# Patient Record
Sex: Female | Born: 1955
Health system: Southern US, Community
[De-identification: ages and names within clinical notes are randomized; demographics above are authoritative.]

## PROBLEM LIST (undated history)

## (undated) DIAGNOSIS — Z8041 Family history of malignant neoplasm of ovary: Secondary | ICD-10-CM

## (undated) DIAGNOSIS — R112 Nausea with vomiting, unspecified: Secondary | ICD-10-CM

## (undated) DIAGNOSIS — E7800 Pure hypercholesterolemia, unspecified: Secondary | ICD-10-CM

## (undated) DIAGNOSIS — C439 Malignant melanoma of skin, unspecified: Secondary | ICD-10-CM

## (undated) DIAGNOSIS — Z923 Personal history of irradiation: Secondary | ICD-10-CM

## (undated) DIAGNOSIS — Z803 Family history of malignant neoplasm of breast: Secondary | ICD-10-CM

## (undated) DIAGNOSIS — F909 Attention-deficit hyperactivity disorder, unspecified type: Secondary | ICD-10-CM

## (undated) DIAGNOSIS — Z1379 Encounter for other screening for genetic and chromosomal anomalies: Principal | ICD-10-CM

## (undated) DIAGNOSIS — C801 Malignant (primary) neoplasm, unspecified: Secondary | ICD-10-CM

## (undated) DIAGNOSIS — Z9889 Other specified postprocedural states: Secondary | ICD-10-CM

## (undated) DIAGNOSIS — Z800 Family history of malignant neoplasm of digestive organs: Secondary | ICD-10-CM

## (undated) HISTORY — DX: Malignant melanoma of skin, unspecified: C43.9

## (undated) HISTORY — DX: Family history of malignant neoplasm of digestive organs: Z80.0

## (undated) HISTORY — DX: Encounter for other screening for genetic and chromosomal anomalies: Z13.79

## (undated) HISTORY — DX: Personal history of irradiation: Z92.3

## (undated) HISTORY — DX: Family history of malignant neoplasm of breast: Z80.3

## (undated) HISTORY — PX: REDUCTION MAMMAPLASTY: SUR839

## (undated) HISTORY — PX: BREAST LUMPECTOMY: SHX2

## (undated) HISTORY — DX: Family history of malignant neoplasm of ovary: Z80.41

## (undated) HISTORY — PX: SHOULDER ARTHROSCOPY W/ ROTATOR CUFF REPAIR: SHX2400

## (undated) HISTORY — PX: ABDOMINAL HYSTERECTOMY: SHX81

## (undated) HISTORY — PX: KNEE ARTHROSCOPY: SUR90

---

## 1998-07-13 ENCOUNTER — Encounter: Payer: Self-pay | Admitting: Obstetrics & Gynecology

## 1998-07-13 ENCOUNTER — Ambulatory Visit (HOSPITAL_COMMUNITY): Admission: RE | Admit: 1998-07-13 | Discharge: 1998-07-13 | Payer: Self-pay | Admitting: Obstetrics & Gynecology

## 1999-11-05 ENCOUNTER — Ambulatory Visit (HOSPITAL_COMMUNITY): Admission: RE | Admit: 1999-11-05 | Discharge: 1999-11-05 | Payer: Self-pay | Admitting: Occupational Medicine

## 1999-11-05 ENCOUNTER — Encounter: Payer: Self-pay | Admitting: Occupational Medicine

## 1999-12-09 ENCOUNTER — Ambulatory Visit (HOSPITAL_BASED_OUTPATIENT_CLINIC_OR_DEPARTMENT_OTHER): Admission: RE | Admit: 1999-12-09 | Discharge: 1999-12-09 | Payer: Self-pay | Admitting: Orthopedic Surgery

## 1999-12-17 ENCOUNTER — Encounter: Admission: RE | Admit: 1999-12-17 | Discharge: 2000-02-16 | Payer: Self-pay | Admitting: Orthopedic Surgery

## 2000-04-11 ENCOUNTER — Other Ambulatory Visit: Admission: RE | Admit: 2000-04-11 | Discharge: 2000-04-11 | Payer: Self-pay | Admitting: Obstetrics & Gynecology

## 2000-05-09 ENCOUNTER — Encounter: Payer: Self-pay | Admitting: Obstetrics & Gynecology

## 2000-05-09 ENCOUNTER — Ambulatory Visit (HOSPITAL_COMMUNITY): Admission: RE | Admit: 2000-05-09 | Discharge: 2000-05-09 | Payer: Self-pay | Admitting: Obstetrics & Gynecology

## 2000-05-17 ENCOUNTER — Encounter: Admission: RE | Admit: 2000-05-17 | Discharge: 2000-05-17 | Payer: Self-pay | Admitting: Obstetrics & Gynecology

## 2000-05-17 ENCOUNTER — Encounter: Payer: Self-pay | Admitting: Obstetrics & Gynecology

## 2001-06-04 ENCOUNTER — Encounter: Payer: Self-pay | Admitting: Obstetrics & Gynecology

## 2001-06-04 ENCOUNTER — Encounter: Admission: RE | Admit: 2001-06-04 | Discharge: 2001-06-04 | Payer: Self-pay | Admitting: Obstetrics & Gynecology

## 2001-11-02 ENCOUNTER — Other Ambulatory Visit: Admission: RE | Admit: 2001-11-02 | Discharge: 2001-11-02 | Payer: Self-pay | Admitting: Obstetrics & Gynecology

## 2002-02-04 ENCOUNTER — Emergency Department (HOSPITAL_COMMUNITY): Admission: EM | Admit: 2002-02-04 | Discharge: 2002-02-04 | Payer: Self-pay | Admitting: Emergency Medicine

## 2003-06-03 ENCOUNTER — Ambulatory Visit (HOSPITAL_BASED_OUTPATIENT_CLINIC_OR_DEPARTMENT_OTHER): Admission: RE | Admit: 2003-06-03 | Discharge: 2003-06-03 | Payer: Self-pay | Admitting: Plastic Surgery

## 2003-06-03 ENCOUNTER — Ambulatory Visit (HOSPITAL_COMMUNITY): Admission: RE | Admit: 2003-06-03 | Discharge: 2003-06-03 | Payer: Self-pay | Admitting: Plastic Surgery

## 2003-06-03 ENCOUNTER — Encounter (INDEPENDENT_AMBULATORY_CARE_PROVIDER_SITE_OTHER): Payer: Self-pay | Admitting: Specialist

## 2003-06-18 ENCOUNTER — Ambulatory Visit (HOSPITAL_COMMUNITY): Admission: RE | Admit: 2003-06-18 | Discharge: 2003-06-18 | Payer: Self-pay | Admitting: Obstetrics & Gynecology

## 2003-09-02 ENCOUNTER — Other Ambulatory Visit: Admission: RE | Admit: 2003-09-02 | Discharge: 2003-09-02 | Payer: Self-pay | Admitting: Obstetrics & Gynecology

## 2004-07-17 ENCOUNTER — Emergency Department (HOSPITAL_COMMUNITY): Admission: EM | Admit: 2004-07-17 | Discharge: 2004-07-17 | Payer: Self-pay | Admitting: Family Medicine

## 2004-08-23 ENCOUNTER — Ambulatory Visit (HOSPITAL_COMMUNITY): Admission: RE | Admit: 2004-08-23 | Discharge: 2004-08-23 | Payer: Self-pay | Admitting: Obstetrics & Gynecology

## 2004-09-06 ENCOUNTER — Other Ambulatory Visit: Admission: RE | Admit: 2004-09-06 | Discharge: 2004-09-06 | Payer: Self-pay | Admitting: Obstetrics & Gynecology

## 2005-10-18 ENCOUNTER — Ambulatory Visit (HOSPITAL_COMMUNITY): Admission: RE | Admit: 2005-10-18 | Discharge: 2005-10-18 | Payer: Self-pay | Admitting: Obstetrics & Gynecology

## 2008-04-03 ENCOUNTER — Ambulatory Visit (HOSPITAL_COMMUNITY): Admission: RE | Admit: 2008-04-03 | Discharge: 2008-04-03 | Payer: Self-pay | Admitting: Obstetrics & Gynecology

## 2009-08-21 ENCOUNTER — Ambulatory Visit (HOSPITAL_COMMUNITY): Admission: RE | Admit: 2009-08-21 | Discharge: 2009-08-21 | Payer: Self-pay | Admitting: Obstetrics & Gynecology

## 2009-08-31 ENCOUNTER — Encounter: Admission: RE | Admit: 2009-08-31 | Discharge: 2009-08-31 | Payer: Self-pay | Admitting: Family Medicine

## 2010-07-06 ENCOUNTER — Encounter
Admission: RE | Admit: 2010-07-06 | Discharge: 2010-07-06 | Payer: Self-pay | Source: Home / Self Care | Attending: Obstetrics & Gynecology | Admitting: Obstetrics & Gynecology

## 2010-08-01 ENCOUNTER — Encounter: Payer: Self-pay | Admitting: Family Medicine

## 2010-08-01 ENCOUNTER — Encounter: Payer: Self-pay | Admitting: Obstetrics & Gynecology

## 2010-09-28 ENCOUNTER — Other Ambulatory Visit: Payer: Self-pay | Admitting: Obstetrics & Gynecology

## 2010-09-28 DIAGNOSIS — Z1231 Encounter for screening mammogram for malignant neoplasm of breast: Secondary | ICD-10-CM

## 2010-10-11 ENCOUNTER — Ambulatory Visit
Admission: RE | Admit: 2010-10-11 | Discharge: 2010-10-11 | Disposition: A | Discharge status: Home / Self Care | Payer: Commercial Indemnity | Source: Ambulatory Visit | Attending: Obstetrics & Gynecology | Admitting: Obstetrics & Gynecology

## 2010-10-11 DIAGNOSIS — Z1231 Encounter for screening mammogram for malignant neoplasm of breast: Secondary | ICD-10-CM

## 2010-11-26 NOTE — Op Note (Signed)
[facility]  Patient:    Felicia Hampton, Felicia Hampton                         MRN: [id number] Proc. Date: 12/09/99 Adm. Date:  [id number] Disc. Date: [id number] Attending:  Colbert Ewing                           Operative Report  PREOPERATIVE DIAGNOSIS:  Left knee torn anterior cruciate ligament and medial meniscus with previous partial open medial meniscectomy.  POSTOPERATIVE DIAGNOSES: 1. Left knee medial meniscus and flap tear of the posterior horn of the    lateral meniscus. 2. Partial anterior cruciate ligament tear without demonstrable instability. 3. 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 4. Fibrotic symptomatic medial plica.  PROCEDURES: 1. Left knee examination under anesthesia. 2. Arthroscopy with chondroplasty of the patella. 3. Excision of the medial plica. 4. Partial medial and lateral meniscectomies. 5. Assessment of the anterior cruciate ligament.  SURGEON:  Loreta Ave, M.D.  ASSISTANT:  Arlys John D. Petrarca, P.A.-C.  ANESTHESIA:  General.  ESTIMATED BLOOD LOSS:  Minimal.  TOURNIQUET TIME:  35 minutes.  SPECIMENS:  None.  CULTURES:  None.  COMPLICATIONS:  None.  DRESSING:  Soft compressive.  DESCRIPTION OF PROCEDURE:  The patient was brought to the operating room and, after adequate anesthesia had been obtained, both knees were examined.  At this point in time, I could not demonstrate significant translational or rotary instability of the left knee on ACL testing.  She did move more on this side compared to the opposite side, but I got a firm end point.  She had pseudolaxity of her medial collateral ligaments of both knees from previous open procedures, but this was not marked.  LCL and PCL were intact.  The tourniquet was applied and she was prepped and draped in the usual sterile fashion.  Exsanguinated with an Esmarch and the tourniquet inflated to 300 mmHg.  Three portals were created, one  superolateral and one each medial and lateral parapatella.  The inflow cannula was introduced and the knee was distended and the arthroscope introduced.  Some traumatic grade 3 chondromalacia of the medial patellar facet was debrided to a stable surface. The trochlea looked good.  Tracking looked good and the chondral changes relatively focal in the medial compartment once debrided to a stable surface and reasonable thickness of cartilage left.  Stab fibrous medial plica with grade 2 changes on the condyle below.  The plica was excised.  The ACL had evidence of injury with partial tearing and fraying at the margins.  This was completely debrided for the frayed nonfunctional fibers, but more than 80% of the ligament still had integrity.  There was a good connection from proximal to distal end with excellent stability on Lachman and drawer and only 1+ excursion with an end point.  Given this, reconstruction was not indicated. Previous open meniscectomy but with retained meniscus about half the meniscus all the way around.  Complex tearing of the middle third with a displaced, relatively large flap tear in the posterior third.  Both saucerized out, retaining a little bit of meniscus at the margin all the way around.  Minimal degenerative changes medially.  The lateral meniscus had a displaced flap tear off of the posterior third.  This was saucerized out and tapered in smoothly. Some fissuring and grade 2 changes laterally, otherwise nothing marked.  The  entire knee was examined.  No other significant findings were appreciated. The instruments and fluid were removed.  The portals and the knee were injected with Marcaine.  The portals were closed with 4-0 nylon. A sterile compressing dressing was applied.  The tourniquet was deflated.  Anesthesia was reversed.  The patient was brought to the recovery room.  She tolerated the surgery well with no complications. DD:  12/09/99 TD:  12/14/99 Job:  [id number]

## 2010-11-26 NOTE — Op Note (Signed)
Felicia, Hampton                            ACCOUNT NO.:  [id number]   MEDICAL RECORD NO.:  [id number]                   PATIENT TYPE:  AMB   LOCATION:  DSC                                  FACILITY:  [facility]   PHYSICIAN:  Alfredia Ferguson, M.D.               DATE OF BIRTH:  22-Jan-1956   DATE OF PROCEDURE:  06/03/2003  DATE OF DISCHARGE:                                 OPERATIVE REPORT   PREOPERATIVE DIAGNOSIS:  1. Biopsy proven actinic keratosis with atypia, left hand, dorsal surface.  2. Suspicious lesions x 2, right hand, dorsal surface.   POSTOPERATIVE DIAGNOSES:  1. Biopsy proven actinic keratosis with atypia, left hand, dorsal surface.  2. Suspicious lesions x 2, right hand, dorsal surface.   OPERATION PERFORMED:  1. Excision of biopsy proven actinic keratosis, dorsum, left hand.  2. Excision, suspicious scaly lesions, right hand, dorsal surface (2 lesions     adjacent to one another).   SURGEON:  Alfredia Ferguson, M.D.   ANESTHESIA:  2% Xylocaine with 1:100,000 epinephrine.   INDICATIONS FOR SURGERY:  This is a 55 year old woman with a biopsy proven  actinic keratosis on the left hand, which showed some atypia.  The plan is  to excise this lesion.  The patient also has 2 adjacent lesions on the  dorsum of the right hand, which look very similar to what was on the left  hand.  She wishes to have those removed.  My plan is to remove both of those  as the same specimen.   DESCRIPTION OF PROCEDURE:  Skin marks were placed around the lesion on the  left hand and the 2 lesions on the right hand.  Local anesthesia using 2%  Xylocaine with 1:100,000 epinephrine was infiltrated.  After waiting  approximately 5 minutes, the right hand was prepped with Betadine and draped  with sterile drapes.  An elliptical excision of the 2 lesions on the right  hand was carried out.  Specimen was passed off for pathology.  Hemostasis  was accomplished using pressure.  The wound was closed using  a running 5-0  nylon suture.   Attention was directed toward the dorsum of the left hand where an identical  procedure was performed.  Closure was carried out in similar fashion.  Each  hand was now cleansed of Betadine, dried, and a light dressing was applied.   The patient was discharged to home in satisfactory condition.                                               Alfredia Ferguson, M.D.    WBB/MEDQ  D:  06/03/2003  T:  06/03/2003  Job:  [id number]

## 2014-05-08 ENCOUNTER — Other Ambulatory Visit: Payer: Self-pay | Admitting: Obstetrics & Gynecology

## 2014-05-09 LAB — CYTOLOGY - PAP

## 2015-02-13 ENCOUNTER — Other Ambulatory Visit (HOSPITAL_COMMUNITY): Payer: Self-pay | Admitting: Obstetrics & Gynecology

## 2015-02-13 DIAGNOSIS — Z1231 Encounter for screening mammogram for malignant neoplasm of breast: Secondary | ICD-10-CM

## 2015-02-16 ENCOUNTER — Ambulatory Visit (HOSPITAL_COMMUNITY)
Admission: RE | Admit: 2015-02-16 | Discharge: 2015-02-16 | Disposition: A | Discharge status: Home / Self Care | Payer: PRIVATE HEALTH INSURANCE | Source: Ambulatory Visit | Attending: Obstetrics & Gynecology | Admitting: Obstetrics & Gynecology

## 2015-02-16 DIAGNOSIS — Z1231 Encounter for screening mammogram for malignant neoplasm of breast: Secondary | ICD-10-CM | POA: Insufficient documentation

## 2016-06-23 ENCOUNTER — Other Ambulatory Visit: Payer: Self-pay | Admitting: Obstetrics & Gynecology

## 2016-06-23 DIAGNOSIS — R928 Other abnormal and inconclusive findings on diagnostic imaging of breast: Secondary | ICD-10-CM

## 2016-07-01 ENCOUNTER — Ambulatory Visit
Admission: RE | Admit: 2016-07-01 | Discharge: 2016-07-01 | Disposition: A | Discharge status: Home / Self Care | Payer: Commercial Indemnity | Source: Ambulatory Visit | Attending: Obstetrics & Gynecology | Admitting: Obstetrics & Gynecology

## 2016-07-01 DIAGNOSIS — R928 Other abnormal and inconclusive findings on diagnostic imaging of breast: Secondary | ICD-10-CM

## 2016-07-01 MED ORDER — GADOBENATE DIMEGLUMINE 529 MG/ML IV SOLN
16.0000 mL | Freq: Once | INTRAVENOUS | Status: AC | PRN
  Administered 2016-07-01: 16 mL via INTRAVENOUS

## 2016-07-07 ENCOUNTER — Other Ambulatory Visit: Payer: Self-pay | Admitting: Obstetrics & Gynecology

## 2016-07-07 DIAGNOSIS — R9389 Abnormal findings on diagnostic imaging of other specified body structures: Secondary | ICD-10-CM

## 2016-07-11 DIAGNOSIS — C50919 Malignant neoplasm of unspecified site of unspecified female breast: Secondary | ICD-10-CM

## 2016-07-11 DIAGNOSIS — Z923 Personal history of irradiation: Secondary | ICD-10-CM

## 2016-07-11 DIAGNOSIS — C801 Malignant (primary) neoplasm, unspecified: Secondary | ICD-10-CM

## 2016-07-11 HISTORY — DX: Malignant neoplasm of unspecified site of unspecified female breast: C50.919

## 2016-07-11 HISTORY — DX: Personal history of irradiation: Z92.3

## 2016-07-11 HISTORY — DX: Malignant (primary) neoplasm, unspecified: C80.1

## 2016-07-12 ENCOUNTER — Ambulatory Visit
Admission: RE | Admit: 2016-07-12 | Discharge: 2016-07-12 | Disposition: A | Discharge status: Home / Self Care | Payer: Commercial Indemnity | Source: Ambulatory Visit | Attending: Obstetrics & Gynecology | Admitting: Obstetrics & Gynecology

## 2016-07-12 DIAGNOSIS — R9389 Abnormal findings on diagnostic imaging of other specified body structures: Secondary | ICD-10-CM

## 2016-07-12 MED ORDER — GADOBENATE DIMEGLUMINE 529 MG/ML IV SOLN
16.0000 mL | Freq: Once | INTRAVENOUS | Status: AC | PRN
  Administered 2016-07-12: 16 mL via INTRAVENOUS

## 2016-07-15 ENCOUNTER — Encounter: Payer: Self-pay | Admitting: Oncology

## 2016-07-18 ENCOUNTER — Ambulatory Visit
Admission: RE | Admit: 2016-07-18 | Discharge: 2016-07-18 | Disposition: A | Discharge status: Home / Self Care | Payer: Commercial Indemnity | Source: Ambulatory Visit | Attending: General Surgery | Admitting: General Surgery

## 2016-07-18 ENCOUNTER — Other Ambulatory Visit: Payer: Self-pay | Admitting: General Surgery

## 2016-07-18 DIAGNOSIS — N6320 Unspecified lump in the left breast, unspecified quadrant: Secondary | ICD-10-CM

## 2016-07-19 ENCOUNTER — Telehealth: Payer: Self-pay | Admitting: Genetic Counselor

## 2016-07-19 NOTE — Telephone Encounter (Signed)
Pt confirmed appt, verified demo and insurance, reviewed location of [place]. Faxed appt date/time to referring provider.

## 2016-07-21 ENCOUNTER — Ambulatory Visit (HOSPITAL_BASED_OUTPATIENT_CLINIC_OR_DEPARTMENT_OTHER): Payer: Commercial Indemnity | Admitting: Genetic Counselor

## 2016-07-21 ENCOUNTER — Other Ambulatory Visit: Payer: Self-pay | Admitting: General Surgery

## 2016-07-21 ENCOUNTER — Other Ambulatory Visit: Payer: Commercial Indemnity

## 2016-07-21 ENCOUNTER — Encounter: Payer: Self-pay | Admitting: Genetic Counselor

## 2016-07-21 DIAGNOSIS — C50912 Malignant neoplasm of unspecified site of left female breast: Secondary | ICD-10-CM | POA: Diagnosis not present

## 2016-07-21 DIAGNOSIS — Z803 Family history of malignant neoplasm of breast: Secondary | ICD-10-CM | POA: Insufficient documentation

## 2016-07-21 DIAGNOSIS — Z170 Estrogen receptor positive status [ER+]: Principal | ICD-10-CM

## 2016-07-21 DIAGNOSIS — Z315 Encounter for genetic counseling: Secondary | ICD-10-CM

## 2016-07-21 DIAGNOSIS — Z8041 Family history of malignant neoplasm of ovary: Secondary | ICD-10-CM

## 2016-07-21 DIAGNOSIS — C50212 Malignant neoplasm of upper-inner quadrant of left female breast: Secondary | ICD-10-CM

## 2016-07-21 DIAGNOSIS — N6092 Unspecified benign mammary dysplasia of left breast: Secondary | ICD-10-CM

## 2016-07-21 DIAGNOSIS — Z800 Family history of malignant neoplasm of digestive organs: Secondary | ICD-10-CM | POA: Diagnosis not present

## 2016-07-21 NOTE — Progress Notes (Signed)
[facility]   Patient Name: Felicia Hampton Patient DOB: 07-21-1955 Encounter Date: 07/21/2016  Referring Provider: No referring provider defined for this encounter.  Primary Care Provider: No primary care provider on file.  Reason for Visit: Evaluate for hereditary susceptibility to cancer  Ms. Connye Burkitt, a 61 y.o. female, is being seen at the [facility] due to a personal and family history of cancer. She presents to clinic today to discuss the possibility of a hereditary predisposition to cancer and discuss whether genetic testing is warranted.  History of Present Illness: Ms. Ehly was diagnosed recently with left breast cancer at the age of 1. She will be using results of genetic testing to help guide her surgical management.  The breast tumor was ER positive, PR positive, and HER2 negative.  She reports a history of basal cell carcinoma.  She reports a hysterectomy at age 36, but her ovaries remain intact.  She reports a colonoscopy in 2016 where multiple polyps were removed, but she did not know the number or type.   Past Medical History:  Diagnosis Date  . Family history of breast cancer   . Family history of colon cancer   . Family history of ovarian cancer     No past surgical history on file.  Social History   Social History  . Marital status: Single    Spouse name: N/A  . Number of children: N/A  . Years of education: N/A   Social History Main Topics  . Smoking status: Not on file  . Smokeless tobacco: Not on file  . Alcohol use Not on file  . Drug use: Unknown  . Sexual activity: Not on file   Other Topics Concern  . Not on file   Social History Narrative  . No narrative on file     Family History:  During the visit, a 4-generation pedigree was obtained. Family tree will be scanned in the Media tab in Epic  Significant diagnoses include the following:  Family History  Problem  Relation Age of Onset  . Colon cancer Sister 31    deceased 54  . Melanoma Brother     on back; currently 62  . Ovarian cancer Maternal Aunt   . Colon cancer Paternal Grandmother 66    deceased  . Breast cancer Cousin 19    mat female cousin; daughter of unaffected mat aunt; deceased 66    Additionally, Ms. Hemrick has a son (age 60). She has 4 sisters more and 4 more brothers who are all living; cancer-free. One sister died at 77, unrelated to cancer. Her mother died at 39, cancer-free. She did have a TAH/BSO in her 34s. Her mother had a total of 4 sisters and 5 brothers; only one sister had ovarian cancer. Her father had 5 brothers and no sisters.  Ms. Kobrin ancestry is Caucasian from [place]. There is no known Jewish ancestry and no consanguinity.  Assessment and Plan: Ms. Weisenburger is a 61 y.o. female with a personal and family history of cancer as noted above. Given the large size of her family and many unaffected family members, this history is not suggestive of a hereditary predisposition to cancer. Genetic testing is recommended, however, to determine whether she has a pathogenic mutation that would impact her surgical management as well as cancer screening and risk-reduction options for future cancers. We reviewed the characteristics, features and inheritance patterns of  hereditary cancer syndromes. We discussed the process of genetic testing, including insurance coverage and implications of results: positive, negative and Variant of Uncertain Significance. A negative result will be reassuring.   Ms. Picone wished to pursue genetic testing and a blood sample will be sent to [facility] for analysis. [facility]'s STAT breast panel was requested as it will impact surgical decisions and results should be available in about 1 week. The 9 genes on this panel are ATM, BRCA1, BRCA2, CDH1, CHEK2, PALB2, PTEN, STK11, TP53. If this test is negative, analysis of additional genes on a larger hereditary cancer  panel will proceed. She will be called after each result is obtained.  Ms. Shimer is encouraged to remain in contact with Cancer Genetics annually so that we can update the family history and inform her of any changes in cancer genetics and testing that may be of benefit for this family. Ms.  Robertshaw questions were answered to her satisfaction today and she is welcome to call with any additional questions or concerns. Thank you for the referral and allowing [place] to share in the care of your patient.   Dr. Jana Hakim was available for questions concerning this case. Total time spent by Steele Berg, MS, CGC in face-to-face counseling was approximately 35 minutes.   Steele Berg, MS, [place] Certified Genetic Counselor phone: [phone] [email or web address]   ______________________________________________________________________ For Office Staff:  Number of people involved in session: 1 Was an Intern/ student involved with case: no

## 2016-07-25 ENCOUNTER — Other Ambulatory Visit: Payer: Self-pay | Admitting: *Deleted

## 2016-07-25 DIAGNOSIS — C50912 Malignant neoplasm of unspecified site of left female breast: Secondary | ICD-10-CM

## 2016-07-25 DIAGNOSIS — Z170 Estrogen receptor positive status [ER+]: Principal | ICD-10-CM

## 2016-07-26 ENCOUNTER — Ambulatory Visit (HOSPITAL_BASED_OUTPATIENT_CLINIC_OR_DEPARTMENT_OTHER): Payer: Commercial Indemnity | Admitting: Oncology

## 2016-07-26 ENCOUNTER — Other Ambulatory Visit (HOSPITAL_BASED_OUTPATIENT_CLINIC_OR_DEPARTMENT_OTHER): Payer: Commercial Indemnity

## 2016-07-26 DIAGNOSIS — C50212 Malignant neoplasm of upper-inner quadrant of left female breast: Secondary | ICD-10-CM

## 2016-07-26 DIAGNOSIS — C50912 Malignant neoplasm of unspecified site of left female breast: Secondary | ICD-10-CM

## 2016-07-26 DIAGNOSIS — Z170 Estrogen receptor positive status [ER+]: Principal | ICD-10-CM

## 2016-07-26 LAB — CBC WITH DIFFERENTIAL/PLATELET
BASO%: 0.6 % (ref 0.0–2.0)
BASOS ABS: 0 10*3/uL (ref 0.0–0.1)
EOS ABS: 0.1 10*3/uL (ref 0.0–0.5)
EOS%: 1.5 % (ref 0.0–7.0)
HEMATOCRIT: 38.9 % (ref 34.8–46.6)
HEMOGLOBIN: 13.4 g/dL (ref 11.6–15.9)
LYMPH#: 1.9 10*3/uL (ref 0.9–3.3)
LYMPH%: 26.9 % (ref 14.0–49.7)
MCH: 28.8 pg (ref 25.1–34.0)
MCHC: 34.4 g/dL (ref 31.5–36.0)
MCV: 83.5 fL (ref 79.5–101.0)
MONO#: 0.7 10*3/uL (ref 0.1–0.9)
MONO%: 9.3 % (ref 0.0–14.0)
NEUT%: 61.7 % (ref 38.4–76.8)
NEUTROS ABS: 4.4 10*3/uL (ref 1.5–6.5)
PLATELETS: 301 10*3/uL (ref 145–400)
RBC: 4.66 10*6/uL (ref 3.70–5.45)
RDW: 12.7 % (ref 11.2–14.5)
WBC: 7.2 10*3/uL (ref 3.9–10.3)

## 2016-07-26 LAB — COMPREHENSIVE METABOLIC PANEL
ALBUMIN: 4 g/dL (ref 3.5–5.0)
ALK PHOS: 96 U/L (ref 40–150)
ALT: 37 U/L (ref 0–55)
ANION GAP: 10 meq/L (ref 3–11)
AST: 29 U/L (ref 5–34)
BILIRUBIN TOTAL: 0.33 mg/dL (ref 0.20–1.20)
BUN: 18.5 mg/dL (ref 7.0–26.0)
CALCIUM: 10 mg/dL (ref 8.4–10.4)
CO2: 26 mEq/L (ref 22–29)
Chloride: 104 mEq/L (ref 98–109)
Creatinine: 0.7 mg/dL (ref 0.6–1.1)
GLUCOSE: 104 mg/dL (ref 70–140)
POTASSIUM: 4.1 meq/L (ref 3.5–5.1)
SODIUM: 139 meq/L (ref 136–145)
TOTAL PROTEIN: 7.1 g/dL (ref 6.4–8.3)

## 2016-07-26 NOTE — Progress Notes (Signed)
[place]  [phone] [phone]     ID: AIZLYN SCHIFANO DOB: [birth year]  MR#: [id number]  GLO#:[id number]  Patient Care Team: Chauncey Cruel, MD as Consulting Physician (Oncology) Rolm Bookbinder, MD as Consulting Physician (General Surgery) Viona Gilmore Evette Cristal, MD as Consulting Physician (Obstetrics and Gynecology) Irene Limbo, MD as Consulting Physician (Plastic Surgery) Chauncey Cruel, MD OTHER MD:  CHIEF COMPLAINT: Estrogen receptor positive breast cancer  CURRENT TREATMENT: Awaiting definitive surgery   BREAST CANCER HISTORY: Savanna had routine screening mammography at Dr. Verlon Au suggesting an abnormality in her left breast upper inner quadrant. Breast density was category B. She was referred for ultrasonography which did not identify the mass. It also found the axilla to be clear sonographically. Breast MRI 07/01/2016 found no abnormalities in the right breast. In the left breast central upper section there was a 0.8 cm enhancing mass associated with architectural distortion. There was a 6 cm area of non-masslike enhancement in the upper outer quadrant of the left breast 3 cm lateral to the other lesion. There were no abnormal appearing lymph nodes.  On 07/12/2016 she underwent MRI guided core biopsies of these 2 areas in question. The final pathology ([id number]) showed in the upper inner quadrant, invasive ductal carcinoma, grade 1, estrogen receptor 100% positive, progesterone receptor 100% positive, both with strong staining intensity, with an MIB-1 of 2%, and no HER-2 amplification, the signals ratio being 1.89 and the number per cell 2.65. In the upper outer quadrant biopsy there was only atypical lobular hyperplasia.  The patient's subsequent history is as detailed below  INTERVAL HISTORY: Her case was also presented in the multidisciplinary breast cancer conference 07/20/2016. At that time a preliminary plan was proposed: Breast  conserving surgery with plastic intervention and contralateral breast reduction, followed by radiation as appropriate and then anti-estrogens.  REVIEW OF SYSTEMS: There were no specific symptoms leading to the original mammogram, which was routinely scheduled. The patient denies unusual headaches, visual changes, nausea, vomiting, stiff neck, dizziness, or gait imbalance. There has been no cough, phlegm production, or pleurisy, no chest pain or pressure, and no change in bowel or bladder habits. The patient denies fever, rash, bleeding, unexplained fatigue or unexplained weight loss. She has a history of multiple basal cell tumors. She admits to some hot flashes. A detailed review of systems was otherwise entirely negative.   PAST MEDICAL HISTORY: Past Medical History:  Diagnosis Date  . Family history of breast cancer   . Family history of colon cancer   . Family history of ovarian cancer     PAST SURGICAL HISTORY: No past surgical history on file.  FAMILY HISTORY Family History  Problem Relation Age of Onset  . Colon cancer Sister 5    deceased 11  . Melanoma Brother     on back; currently 2  . Ovarian cancer Maternal Aunt   . Colon cancer Paternal Grandmother 67    deceased  . Breast cancer Cousin 9    mat female cousin; daughter of unaffected mat aunt; deceased 6  . Prostate cancer Father     deceased 67  The patient's father died from prostate cancer at age 68. The patient's mother died from heart disease at age 80. The patient had 5 brothers, 6 sisters. A twin sister died at the age of 1 from colon cancer. A maternal cousin had breast cancer at age 64. A maternal aunt had ovarian cancer, age at diagnosis unknown. Another maternal aunt had kidney  cancer. On the father's side A grandmother died at age 52 with colon cancer  GYNECOLOGIC HISTORY:  No LMP recorded. Patient is postmenopausal. Menarche age 61, first live birth age 39. The patient is GX P1. She went through  menopause in her early 1s and was on hormone replacement for approximately 10 years, stopping approximately 2016  SOCIAL HISTORY:  Brittish has an interesting athletic history and she saw the [place] to [place] and also did other long-term swims in her youth. She is originally from [place]. She works as an Scientist, research (life sciences) and tells me just doing her job she gets more than 10,000 steps a day. Her husband Herbie Baltimore is retired from Insurance underwriter. He has children of his. The patient's son Rolla Plate lives in [place] and is in Transport planner. The patient has 1 biological granddaughter, 63 months old as of January 2018.    ADVANCED DIRECTIVES: Not in place   HEALTH MAINTENANCE: Social History  Substance Use Topics  . Smoking status: Not on file  . Smokeless tobacco: Not on file  . Alcohol use Not on file     Colonoscopy: July 2016  PAP: 2016  Bone density: 2015   Allergies  Allergen Reactions  . Ace Inhibitors Palpitations and Shortness Of Breath    COX-2 inhibitors  . Diclofenac     Other reaction(s): Hypotension (ALLERGY/intolerance)  . Morphine Nausea And Vomiting  . Morphine And Related Nausea And Vomiting    No current outpatient prescriptions on file.   No current facility-administered medications for this visit.     OBJECTIVE: Middle-aged white woman who appears well Vitals:   07/26/16 1656  BP: 123/81  Pulse: 80  Temp: 97.7 F (36.5 C)     There is no height or weight on file to calculate BMI.    ECOG FS:0 - Asymptomatic  Ocular: Sclerae unicteric, pupils equal, round and reactive to light Ear-nose-throat: Oropharynx clear and moist Lymphatic: No cervical or supraclavicular adenopathy Lungs no rales or rhonchi, good excursion bilaterally Heart regular rate and rhythm, no murmur appreciated Abd soft, nontender, positive bowel sounds MSK no focal spinal tenderness, no joint edema Neuro: non-focal, well-oriented, appropriate affect Breasts: The right breast is  unremarkable. I do not palpate a mass in the left breast. There are no skin or nipple changes of concern. The left axilla is benign.   LAB RESULTS:  CMP     Component Value Date/Time   NA 139 07/26/2016 1556   K 4.1 07/26/2016 1556   CO2 26 07/26/2016 1556   GLUCOSE 104 07/26/2016 1556   BUN 18.5 07/26/2016 1556   CREATININE 0.7 07/26/2016 1556   CALCIUM 10.0 07/26/2016 1556   PROT 7.1 07/26/2016 1556   ALBUMIN 4.0 07/26/2016 1556   AST 29 07/26/2016 1556   ALT 37 07/26/2016 1556   ALKPHOS 96 07/26/2016 1556   BILITOT 0.33 07/26/2016 1556    INo results found for: SPEP, UPEP  Lab Results  Component Value Date   WBC 7.2 07/26/2016   NEUTROABS 4.4 07/26/2016   HGB 13.4 07/26/2016   HCT 38.9 07/26/2016   MCV 83.5 07/26/2016   PLT 301 07/26/2016      Chemistry      Component Value Date/Time   NA 139 07/26/2016 1556   K 4.1 07/26/2016 1556   CO2 26 07/26/2016 1556   BUN 18.5 07/26/2016 1556   CREATININE 0.7 07/26/2016 1556      Component Value Date/Time   CALCIUM 10.0 07/26/2016 1556  ALKPHOS 96 07/26/2016 1556   AST 29 07/26/2016 1556   ALT 37 07/26/2016 1556   BILITOT 0.33 07/26/2016 1556       No results found for: LABCA2  No components found for: LABCA125  No results for input(s): INR in the last 168 hours.  Urinalysis No results found for: COLORURINE, APPEARANCEUR, LABSPEC, PHURINE, GLUCOSEU, HGBUR, BILIRUBINUR, KETONESUR, PROTEINUR, UROBILINOGEN, NITRITE, LEUKOCYTESUR   STUDIES: Mr Breast Bilateral W Wo Contrast  Result Date: 07/06/2016 CLINICAL DATA:  Distortion in the superior mid left breast posterior depth at recent mammography with no ultrasound correlate. LABS:  None obtained today. EXAM: BILATERAL BREAST MRI WITH AND WITHOUT CONTRAST TECHNIQUE: Multiplanar, multisequence MR images of both breasts were obtained prior to and following the intravenous administration of 16 ml of MultiHance. THREE-DIMENSIONAL MR IMAGE RENDERING ON INDEPENDENT  WORKSTATION: Three-dimensional MR images were rendered by post-processing of the original MR data on an independent workstation. The three-dimensional MR images were interpreted, and findings are reported in the following complete MRI report for this study. Three dimensional images were evaluated at the independent DynaCad workstation COMPARISON:  Previous mammogram and ultrasound examinations at [facility], the most recent dated 06/15/2016. FINDINGS: Breast composition: b. Scattered fibroglandular tissue. Background parenchymal enhancement: Mild. Right breast: No mass or abnormal enhancement. Left breast: 8 x 6 x 5 mm irregular enhancing mass-like area in the central, upper left breast, slightly medially. There is associated architectural distortion, corresponding to the architectural distortion seen on the recent mammograms. This demonstrates low-grade enhancement with plateau kinetics. There is also a 6.0 x 3.7 x 1.9 cm area of non mass enhancement in the upper-outer quadrant of the left breast, slightly posteriorly. This also demonstrates low-grade enhancement with plateau kinetics. This is approximately 3 cm lateral to the 8 mm irregular enhancing mass-like area with architectural distortion. Lymph nodes: No abnormal appearing lymph nodes. Ancillary findings:  None. IMPRESSION: 1. 8 x 6 x 5 mm irregular enhancing mass-like area in the central, upper left breast, slightly medially. This has associated architectural distortion, corresponding to the distortion seen on the recent mammograms. This is suspicious for malignancy. 2. 6.0 x 3.7 x 1.9 cm area of non mass enhancement in the upper-outer quadrant of the left breast, slightly posteriorly. This is also suspicious for malignancy. RECOMMENDATION: MR guided core needle biopsy of the 8 mm irregular enhancing mass-like area in the central, upper left breast, slightly medially, with associated architectural distortion and MR guided core needle biopsy  of the 6.0 cm area of non mass enhancement in the upper outer quadrant of the left breast. BI-RADS CATEGORY  4: Suspicious. Electronically Signed   By: Claudie Revering M.D.   On: 07/06/2016 10:46   [place] Limited Joint Space Structures Up Left  Result Date: 07/18/2016 CLINICAL DATA:  61 year old female with recently diagnosed invasive ductal carcinoma of the left breast. Ultrasound of the left axilla is requested for evaluation of left axillary lymph nodes. EXAM: ULTRASOUND OF THE LEFT AXILLA COMPARISON:  Prior exams. FINDINGS: On physical exam,no palpable abnormality is identified in the left axilla. Targeted ultrasound of the left axilla was performed demonstrating no suspicious appearing axillary lymph nodes. Multiple normal appearing axillary lymph nodes were visualized. IMPRESSION: No suspicious appearing left axillary lymph nodes. RECOMMENDATION: Treatment plan for the patient's known left breast malignancy. I have discussed the findings and recommendations with the patient. Results were also provided in writing at the conclusion of the visit. If applicable, a reminder letter will be sent to the patient  regarding the next appointment. BI-RADS CATEGORY  1: Negative. Electronically Signed   By: Pamelia Hoit M.D.   On: 07/18/2016 10:18   Mm Clip Placement Left  Result Date: 07/12/2016 CLINICAL DATA:  Status post MR guided core needle biopsies of an 8 mm area of mass-like enhancement with architectural distortion in the upper inner quadrant of the left breast and a 6 cm area of non-mass enhancement in the upper-outer quadrant of the left breast. EXAM: DIAGNOSTIC LEFT MAMMOGRAM POST MRI BIOPSY COMPARISON:  Previous exam(s). FINDINGS: Mammographic images were obtained following MR guided biopsy of an 8 mm area of mass-like enhancement with architectural distortion in the upper inner quadrant of the left breast and a 6 cm area of non- mass enhancement in the upper-outer quadrant of the left breast. These demonstrate a  dumbbell-shaped biopsy marker clip 1.1 cm lateral to the area biopsied in the upper inner quadrant of the left breast. A post biopsy cavity containing air is demonstrated at the location of the targeted area of enhancement and distortion. There is also a cylinder-shaped biopsy marker clip at the location of the area biopsied in the upper-outer quadrant of the left breast. This clip is located 7.5 cm lateral and posterior to the dumbbell-shaped clip and 8.6 cm lateral and posterior to the area biopsied in the upper inner quadrant of the left breast. IMPRESSION: The dumbbell-shaped biopsy marker clip is located 1.1 cm lateral to the area biopsied in the upper inner quadrant of the left breast and the cylinder-shaped biopsy marker clip is appropriately located at the site of the area biopsied in upper-outer quadrant of the left breast. Final Assessment: Post Procedure Mammograms for Marker Placement Electronically Signed   By: Claudie Revering M.D.   On: 07/12/2016 09:59   Mr Aundra Millet Breast Bx Johnella Moloney Dev 1st Lesion Image Bx Spec Mr Guide  Addendum Date: 07/20/2016   ADDENDUM REPORT: 07/15/2016 07:12 ADDENDUM: Pathology revealed grade I invasive ductal carcinoma, atypical lobular hyperplasia and a complex sclerosing lesion in the upper inner quadrant of the left breast and atypical lobular hyperplasia in the upper outer quadrant of the left breast. This was found to be concordant by Dr. Claudie Revering. Pathology results were discussed with the patient by Dr. Dory Horn in his office. Surgical consultation has been arranged with Dr. Rolm Bookbinder at [facility] on July 18, 2016. Pathology results reported by Susa Raring RN, BSN on 07/15/2016. Electronically Signed   By: Claudie Revering M.D.   On: 07/15/2016 07:12   Result Date: 07/20/2016 CLINICAL DATA:  8 mm mass-like area of enhancement in the upper inner quadrant of the left breast with associated architectural distortion at recent mammography and  MRI. EXAM: MRI GUIDED CORE NEEDLE BIOPSY OF THE LEFT BREAST TECHNIQUE: Multiplanar, multisequence MR imaging of the left breast was performed both before and after administration of intravenous contrast. CONTRAST:  31m MULTIHANCE GADOBENATE DIMEGLUMINE 529 MG/ML IV SOLN COMPARISON:  Previous exams. FINDINGS: I met with the patient, and we discussed the procedure of MRI guided biopsy, including risks, benefits, and alternatives. Specifically, we discussed the risks of infection, bleeding, tissue injury, clip migration, and inadequate sampling. Informed, written consent was given. The usual time out protocol was performed immediately prior to the procedure. Using sterile technique, 1% Lidocaine, MRI guidance, and a 9 gauge vacuum assisted device, biopsy was performed of the recently demonstrated 8 mm area of mass-like enhancement with architectural distortion in the upper inner quadrant of the left breast  using a lateral approach. At the conclusion of the procedure, a dumbbell-shaped tissue marker clip was deployed into the biopsy cavity. Follow-up 2-view mammogram was performed and dictated separately. IMPRESSION: MRI guided biopsy of an 8 mm area of mass-like enhancement with architectural distortion in the upper inner quadrant of the left breast. No apparent complications. Electronically Signed: By: Claudie Revering M.D. On: 07/12/2016 09:47   Mr Aundra Millet Breast Bx W Loc Dev Ea Add Lesion Image Bx Spec Mr Guide  Addendum Date: 07/20/2016   ADDENDUM REPORT: 07/15/2016 07:12 ADDENDUM: Pathology revealed grade I invasive ductal carcinoma, atypical lobular hyperplasia and a complex sclerosing lesion in the upper inner quadrant of the left breast and atypical lobular hyperplasia in the upper outer quadrant of the left breast. This was found to be concordant by Dr. Claudie Revering. Pathology results were discussed with the patient by Dr. Dory Horn in his office. Surgical consultation has been arranged with Dr. Rolm Bookbinder at  [facility] on July 18, 2016. Pathology results reported by Susa Raring RN, BSN on 07/15/2016. Electronically Signed   By: Claudie Revering M.D.   On: 07/15/2016 07:12   Result Date: 07/20/2016 CLINICAL DATA:  6 cm area of non-mass enhancement in the upper-outer quadrant of the left breast on a recent breast MRI. EXAM: MRI GUIDED CORE NEEDLE BIOPSY OF THE LEFT BREAST TECHNIQUE: Multiplanar, multisequence MR imaging of the left breast was performed both before and after administration of intravenous contrast. CONTRAST:  16 cc MultiHance COMPARISON:  Previous exams. FINDINGS: I met with the patient, and we discussed the procedure of MRI guided biopsy, including risks, benefits, and alternatives. Specifically, we discussed the risks of infection, bleeding, tissue injury, clip migration, and inadequate sampling. Informed, written consent was given. The usual time out protocol was performed immediately prior to the procedure. Using sterile technique, 1% Lidocaine, MRI guidance, and a 9 gauge vacuum assisted device, biopsy was performed of the recently demonstrated 6 cm area of non-mass enhancement in the upper-outer quadrant of the left breast using a lateral approach. At the conclusion of the procedure, a cylinder-shaped tissue marker clip was deployed into the biopsy cavity. Follow-up 2-view mammogram was performed and dictated separately. IMPRESSION: MRI guided biopsy of a 6 cm area of non-mass enhancement in the upper-outer quadrant of the left breast. No apparent complications. Electronically Signed: By: Claudie Revering M.D. On: 07/12/2016 09:49    ELIGIBLE FOR AVAILABLE RESEARCH PROTOCOL: no  ASSESSMENT: 61 y.o. [place] woman status post left breast upper inner quadrant biopsy 07/12/2016 for a clinical T1b N0, stage IA invasive ductal carcinoma, grade 1 estrogen and progesterone receptor strongly positive, HER-2 not amplified, with an MIB-1 of 2%  (a) biopsy of an area of non-masslike  enhancement in the upper outer quadrant 07/12/2016 showed atypical lobular hyperplasia  (1) genetics testing through the [facility] STAT breast panel 07/21/2016, results pending. The 9 genes on this panel are ATM, BRCA1, BRCA2, CDH1, CHEK2, PALB2, PTEN, STK11, TP53. If this test is negative, analysis of additional genes on a larger hereditary cancer panel will proceed.  (2) preliminary surgical plan is breast conserving surgery if possible with bilateral reduction mammoplasties  (3) given the l tumor size, low grade and low proliferation fraction, chemotherapy is not anticipated. We will not request an Oncotype unless the tumor proves to be greater than 1 cm. Pathologically  (4) adjuvant radiation is appropriate  (5) anti-estrogens to follow at the completion of local treatment  PLAN: We spent the better  part of today's hour-long appointment discussing the biology of breast cancer in general, and the specifics of the patient's tumor in particular. We discussed the difference between local and systemic therapy. In terms of loco-regional treatment, Ahri is aware that lumpectomy plus radiation is equivalent to mastectomy as far as survival is concerned. For this reason, and because the cosmetic results are generally superior, we recommend breast conserving surgery. Given the extent of the non-masslike enhancement, in her case this may need to be a with breast reduction surgery bilaterally. She has already met with plastics to discuss this.   We also noted that in terms of sequencing of treatments, whether systemic therapy or surgery is done first does not affect the ultimate outcome. This likely will not be relevant in Berdell's case.   We then discussed the rationale for systemic therapy. There is some risk that this cancer may have already spread to other parts of her body. Patients frequently ask at this point about bone scans, CAT scans and PET scans to find out if they have occult breast cancer  somewhere else. The problem is that in early stage disease we are much more likely to find false positives then true cancers and this would expose the patient to unnecessary procedures as well as unnecessary radiation. Scans cannot answer the question the patient really would like to know, which is whether she has microscopic disease elsewhere in her body. For those reasons we do not recommend them.  Of course we would proceed to aggressive evaluation of any symptoms that might suggest metastatic disease, but that is not the case here.  Next we went over the options for systemic therapy which are anti-estrogens, anti-HER-2 immunotherapy, and chemotherapy. Kinjal does not meet criteria for anti-HER-2 immunotherapy. She is a good candidate for anti-estrogens.  The question of chemotherapy is more complicated. Chemotherapy is most effective in rapidly growing, aggressive tumors. It is much less effective in low-grade, slow growing cancers, like Josanna 's. Our only internal review suggests that tumors less than a centimeter, grade 1 with a low proliferation fraction, are invariably low risk on the Oncotype test. For that reason we are going to forego requesting an Oncotype from the definitive surgical sample, as suggested by NCCN guidelines, unless the tumor proves to be greater than 1 cm after surgery. At this point I do not anticipate Juleah benefiting from chemotherapy.  She has a significant family history and is at risk of carrying a deleterious mutation. We discussed the fact that if she does carry for example BRCA mutation her risk of developing another breast cancer in either breast may be as high as 80%. We also discussed the fact that bilateral mastectomies are not mandatory in that setting. Intensified screening with yearly breast MRI in addition to mammography is equally safe area  Given those options Omelia was not sure today whether she would want bilateral mastectomies or intensified screening if  she proves to carry a deleterious mutation. She will want to have the definitive genetics results before making that decision. We anticipate her genetics results will be available later this week. If negative she is clear she would want breast conservation surgery if feasible.  Once Saleha recovers from her surgery and completes her radiation, she will return to see me. We will discuss anti-estrogens at that time. I reassured her that in any case her overall prognosis is good. She will call with any problems that may develop before her next visit here.  Chauncey Cruel, MD  07/27/2016 8:26 AM Medical Oncology and Hematology [facility] [address] [place],  [address] Tel. [phone]    Fax. [phone]

## 2016-07-27 ENCOUNTER — Telehealth: Payer: Self-pay | Admitting: *Deleted

## 2016-07-27 ENCOUNTER — Ambulatory Visit: Payer: Self-pay | Admitting: Genetic Counselor

## 2016-07-27 NOTE — Telephone Encounter (Signed)
  Oncology Nurse Navigator Documentation  Navigator Location: [facility] (07/27/16 1200) Referral date to RadOnc/MedOnc: 07/19/16 (07/27/16 1200) )Navigator Encounter Type: Introductory phone call (07/27/16 1200)   Abnormal Finding Date: 07/01/16 (07/27/16 1200) Confirmed Diagnosis Date: 07/12/16 (07/27/16 1200)   Genetic Counseling Date: 07/21/16 (07/27/16 1200) Genetic Counseling Type: Urgent (07/27/16 1200) Plastic Surgery Consult Date: 07/18/16 (07/27/16 1200)       Patient Visit Type: MedOnc;Initial (07/27/16 1200) Treatment Phase: Pre-Tx/Tx Discussion (07/27/16 1200) Barriers/Navigation Needs: No barriers at this time (07/27/16 1200)                Acuity: Level 2 (07/27/16 1200)   Acuity Level 2: Initial guidance, education and coordination as needed;Educational needs;Assistance expediting appointments;Referrals such as genetics, survivorship;Ongoing guidance and education throughout treatment as needed (07/27/16 1200)     Time Spent with Patient: 15 (07/27/16 1200)

## 2016-07-27 NOTE — Progress Notes (Signed)
Patient Name: Felicia Hampton Patient DOB: September 07, 1955 Encounter Date: 07/21/2016   Reason for Call: Discuss results of genetic testing - 1st of 2 results  Referring Provider: Rolm Bookbinder, MD   This is a brief note to document preliminary genetic test results.  _0 @ _1 @ was called today with results of genetic testing. She was seen in the [facility] on 07/21/16 and was recommended to undergo genetic testing. Due to time constraints and needing actionable results for surgical planning, [facility]'s STAT Breast Panel was ordered first.   PRELIMINARY TEST RESULTS: Genetic testing involved analysis of 9 genes: ATM, BRCA1, BRCA2, CDH1, CHEK2, PALB2, PTEN, STK11 and TP53. Testing did not reveal any pathogenic mutation in any of these genes. Testing is in process for the remaining genes on the Common Cancers gene panel.  Ms. Hubers does not need to wait on the remaining genes to proceed with surgery. She will be called again in ~2-3 weeks with the next result.    Steele Berg, Hannah, [place] Certified Genetic Counselor Phone: [phone]

## 2016-07-28 ENCOUNTER — Telehealth: Payer: Self-pay | Admitting: Oncology

## 2016-07-28 NOTE — Telephone Encounter (Signed)
S/w pt, gave appt for 3/13 @ 3.30.

## 2016-08-02 ENCOUNTER — Encounter: Payer: Self-pay | Admitting: Genetic Counselor

## 2016-08-02 ENCOUNTER — Ambulatory Visit: Payer: Self-pay | Admitting: Genetic Counselor

## 2016-08-02 ENCOUNTER — Other Ambulatory Visit: Payer: Self-pay | Admitting: General Surgery

## 2016-08-02 ENCOUNTER — Encounter (HOSPITAL_BASED_OUTPATIENT_CLINIC_OR_DEPARTMENT_OTHER): Payer: Self-pay | Admitting: *Deleted

## 2016-08-02 DIAGNOSIS — Z1379 Encounter for other screening for genetic and chromosomal anomalies: Secondary | ICD-10-CM

## 2016-08-02 DIAGNOSIS — N6092 Unspecified benign mammary dysplasia of left breast: Secondary | ICD-10-CM

## 2016-08-02 HISTORY — DX: Encounter for other screening for genetic and chromosomal anomalies: Z13.79

## 2016-08-02 NOTE — Progress Notes (Signed)
[facility]    Patient Name: Felicia Hampton Patient DOB: October 08, 1955 Patient Age: 61 y.o. Encounter Date: 08/02/2016  Referring Provider: Rolm Bookbinder, MD  Primary Care Provider: No primary care provider on file.  Felicia Hampton was called today to discuss genetic test results. Please see the Genetics note from her visit on 07/21/2016 for a detailed discussion of her personal and family history.  Genetic Testing: At the time of Felicia Hampton's visit, we recommended she pursue genetic testing of 9 genes that may be used to help guide treatment decisions. Once that test was negative, additional genes on a larger panel were analyzed. Testing which included sequencing and deletion/duplication analysis. Testing did not reveal any pathogenic mutation in any of these genes. A copy of the genetic test report will be scanned into Epic under the media tab.  The genes tested were the 43 genes on [facility]'s Common Cancers panel (APC, ATM, AXIN2, BARD1, BMPR1A, BRCA1, BRCA2, BRIP1, CDH1, CDKN2A, CHEK2, DICER1, EPCAM, GREM1, HOXB13, KIT, MEN1, MLH1, MSH2, MSH6, MUTYH, NBN, NF1, PALB2, PDGFRA, PMS2, POLD1, POLE, PTEN, RAD50, RAD51C, RAD51D, SDHA, SDHB, SDHC, SDHD, SMAD4, SMARCA4, STK11, TP53, TSC1, TSC2, VHL).  Since the current test is not perfect, it is possible there may be a gene mutation that current testing cannot detect, but that chance is small. We also discussed that it is possible that a different genetic factor, which was not part of this testing or has not yet been discovered, is responsible for the cancer diagnoses in the family. Again, the likelihood of this is low. No additional testing is recommended at this time.   Cancer Screening:  This result suggests that Felicia Hampton's cancer was most likely not due to an inherited predisposition. Most cancers happen by chance and this negative test, along with details of her family history, suggests that her cancer falls  into this category. We, therefore, recommended she continue to follow the cancer screening guidelines provided by her physician.   Family Members: Family members are at some increased risk of developing cancer, over the general population risk, simply due to the family history. We recommended women have a yearly mammogram beginning at age 44, a yearly clinical breast exam, and perform monthly breast self-exams. A gynecologic exam is recommended yearly. Colon cancer screening is recommended to begin earlier than age 62 in the family due to Felicia Hampton's brother's diagnosis at age 96.  Any relative who had cancer at a young age or had a particularly rare cancer may also wish to pursue genetic testing. Genetic counselors can be located in other cities, by visiting the website of the [employer] ([employer]ist[employer].[email or web address]) and Field seismologist for a Dietitian by zip code.   Lastly, cancer genetics is a rapidly advancing field and it is possible that new genetic tests will be appropriate for her in the future. We encourage her to remain in contact with [place] on an annual basis so we can update her personal and family histories, and let her know of advances in cancer genetics that may benefit the family. Our contact number was provided. Felicia Hampton is welcome to call anytime with additional questions.    Steele Berg, MS, [place] Certified Genetic Counselor phone: [phone] [email or web address]

## 2016-08-03 NOTE — Progress Notes (Signed)
Pt in to pick up boost breeze, instructions reviewed. 

## 2016-08-08 ENCOUNTER — Ambulatory Visit
Admission: RE | Admit: 2016-08-08 | Discharge: 2016-08-08 | Disposition: A | Discharge status: Home / Self Care | Payer: Managed Care, Other (non HMO) | Source: Ambulatory Visit | Attending: General Surgery | Admitting: General Surgery

## 2016-08-08 ENCOUNTER — Ambulatory Visit (HOSPITAL_COMMUNITY): Payer: Managed Care, Other (non HMO)

## 2016-08-08 ENCOUNTER — Ambulatory Visit (HOSPITAL_BASED_OUTPATIENT_CLINIC_OR_DEPARTMENT_OTHER)
Admission: RE | Admit: 2016-08-08 | Payer: Managed Care, Other (non HMO) | Source: Ambulatory Visit | Admitting: General Surgery

## 2016-08-08 ENCOUNTER — Other Ambulatory Visit: Payer: Self-pay | Admitting: General Surgery

## 2016-08-08 DIAGNOSIS — N63 Unspecified lump in unspecified breast: Secondary | ICD-10-CM

## 2016-08-08 DIAGNOSIS — C50212 Malignant neoplasm of upper-inner quadrant of left female breast: Secondary | ICD-10-CM

## 2016-08-08 DIAGNOSIS — N6092 Unspecified benign mammary dysplasia of left breast: Secondary | ICD-10-CM

## 2016-08-08 DIAGNOSIS — Z170 Estrogen receptor positive status [ER+]: Principal | ICD-10-CM

## 2016-08-08 HISTORY — DX: Attention-deficit hyperactivity disorder, unspecified type: F90.9

## 2016-08-08 HISTORY — DX: Malignant (primary) neoplasm, unspecified: C80.1

## 2016-08-08 HISTORY — DX: Nausea with vomiting, unspecified: R11.2

## 2016-08-08 HISTORY — DX: Pure hypercholesterolemia, unspecified: E78.00

## 2016-08-08 HISTORY — DX: Other specified postprocedural states: Z98.890

## 2016-08-08 HISTORY — DX: Nausea with vomiting, unspecified: Z98.890

## 2016-08-08 SURGERY — BREAST LUMPECTOMY WITH RADIOACTIVE SEED LOCALIZATION
Anesthesia: General | Site: Breast | Laterality: Left

## 2016-08-08 MED ORDER — ONDANSETRON HCL 4 MG/2ML IJ SOLN
INTRAMUSCULAR | Status: AC
  Filled 2016-08-08: qty 6

## 2016-08-08 MED ORDER — LIDOCAINE 2% (20 MG/ML) 5 ML SYRINGE
INTRAMUSCULAR | Status: AC
  Filled 2016-08-08: qty 5

## 2016-08-09 ENCOUNTER — Other Ambulatory Visit: Payer: Self-pay | Admitting: General Surgery

## 2016-08-09 ENCOUNTER — Ambulatory Visit
Admission: RE | Admit: 2016-08-09 | Discharge: 2016-08-09 | Disposition: A | Discharge status: Home / Self Care | Payer: Managed Care, Other (non HMO) | Source: Ambulatory Visit | Attending: General Surgery | Admitting: General Surgery

## 2016-08-09 ENCOUNTER — Encounter (HOSPITAL_BASED_OUTPATIENT_CLINIC_OR_DEPARTMENT_OTHER): Payer: Self-pay | Admitting: *Deleted

## 2016-08-09 DIAGNOSIS — N63 Unspecified lump in unspecified breast: Secondary | ICD-10-CM

## 2016-08-09 DIAGNOSIS — N6320 Unspecified lump in the left breast, unspecified quadrant: Secondary | ICD-10-CM

## 2016-08-09 MED ORDER — GADOBENATE DIMEGLUMINE 529 MG/ML IV SOLN
16.0000 mL | Freq: Once | INTRAVENOUS | Status: AC | PRN
  Administered 2016-08-09: 16 mL via INTRAVENOUS

## 2016-08-10 ENCOUNTER — Other Ambulatory Visit: Payer: Self-pay | Admitting: General Surgery

## 2016-08-10 DIAGNOSIS — C50212 Malignant neoplasm of upper-inner quadrant of left female breast: Secondary | ICD-10-CM

## 2016-08-10 DIAGNOSIS — N6092 Unspecified benign mammary dysplasia of left breast: Secondary | ICD-10-CM

## 2016-08-10 DIAGNOSIS — Z170 Estrogen receptor positive status [ER+]: Principal | ICD-10-CM

## 2016-08-11 ENCOUNTER — Ambulatory Visit: Admission: RE | Admit: 2016-08-11 | Payer: Managed Care, Other (non HMO) | Source: Ambulatory Visit

## 2016-08-11 ENCOUNTER — Encounter (HOSPITAL_COMMUNITY)
Admission: RE | Admit: 2016-08-11 | Discharge: 2016-08-11 | Disposition: A | Discharge status: Home / Self Care | Payer: Managed Care, Other (non HMO) | Source: Ambulatory Visit | Attending: General Surgery | Admitting: General Surgery

## 2016-08-11 ENCOUNTER — Ambulatory Visit (HOSPITAL_BASED_OUTPATIENT_CLINIC_OR_DEPARTMENT_OTHER): Payer: Managed Care, Other (non HMO) | Admitting: Certified Registered"

## 2016-08-11 ENCOUNTER — Ambulatory Visit
Admission: RE | Admit: 2016-08-11 | Discharge: 2016-08-11 | Disposition: A | Discharge status: Home / Self Care | Payer: Managed Care, Other (non HMO) | Source: Ambulatory Visit | Attending: General Surgery | Admitting: General Surgery

## 2016-08-11 ENCOUNTER — Encounter (HOSPITAL_BASED_OUTPATIENT_CLINIC_OR_DEPARTMENT_OTHER): Payer: Self-pay | Admitting: *Deleted

## 2016-08-11 ENCOUNTER — Other Ambulatory Visit: Payer: Self-pay | Admitting: General Surgery

## 2016-08-11 ENCOUNTER — Ambulatory Visit (HOSPITAL_BASED_OUTPATIENT_CLINIC_OR_DEPARTMENT_OTHER)
Admission: RE | Admit: 2016-08-11 | Discharge: 2016-08-11 | Disposition: A | Discharge status: Home / Self Care | Payer: Managed Care, Other (non HMO) | Source: Ambulatory Visit | Attending: General Surgery | Admitting: General Surgery

## 2016-08-11 ENCOUNTER — Encounter (HOSPITAL_BASED_OUTPATIENT_CLINIC_OR_DEPARTMENT_OTHER)
Admission: RE | Disposition: A | Discharge status: Home / Self Care | Payer: Self-pay | Source: Ambulatory Visit | Attending: General Surgery

## 2016-08-11 DIAGNOSIS — N6092 Unspecified benign mammary dysplasia of left breast: Secondary | ICD-10-CM

## 2016-08-11 DIAGNOSIS — Z803 Family history of malignant neoplasm of breast: Secondary | ICD-10-CM | POA: Diagnosis not present

## 2016-08-11 DIAGNOSIS — Z79899 Other long term (current) drug therapy: Secondary | ICD-10-CM | POA: Insufficient documentation

## 2016-08-11 DIAGNOSIS — C50912 Malignant neoplasm of unspecified site of left female breast: Secondary | ICD-10-CM | POA: Diagnosis present

## 2016-08-11 DIAGNOSIS — Z791 Long term (current) use of non-steroidal anti-inflammatories (NSAID): Secondary | ICD-10-CM | POA: Insufficient documentation

## 2016-08-11 DIAGNOSIS — C50412 Malignant neoplasm of upper-outer quadrant of left female breast: Secondary | ICD-10-CM | POA: Insufficient documentation

## 2016-08-11 DIAGNOSIS — Z170 Estrogen receptor positive status [ER+]: Secondary | ICD-10-CM | POA: Diagnosis not present

## 2016-08-11 DIAGNOSIS — Z87891 Personal history of nicotine dependence: Secondary | ICD-10-CM | POA: Insufficient documentation

## 2016-08-11 HISTORY — PX: BREAST LUMPECTOMY: SHX2

## 2016-08-11 HISTORY — PX: RADIOACTIVE SEED GUIDED PARTIAL MASTECTOMY WITH AXILLARY SENTINEL LYMPH NODE BIOPSY: SHX6520

## 2016-08-11 SURGERY — RADIOACTIVE SEED GUIDED PARTIAL MASTECTOMY WITH AXILLARY SENTINEL LYMPH NODE BIOPSY
Anesthesia: General | Site: Breast | Laterality: Left

## 2016-08-11 MED ORDER — BUPIVACAINE HCL (PF) 0.25 % IJ SOLN
INTRAMUSCULAR | Status: AC
Start: 2016-08-11 — End: 2016-08-11
  Filled 2016-08-11: qty 30

## 2016-08-11 MED ORDER — MIDAZOLAM HCL 2 MG/2ML IJ SOLN
1.0000 mg | INTRAMUSCULAR | Status: DC | PRN
  Administered 2016-08-11: 2 mg via INTRAVENOUS

## 2016-08-11 MED ORDER — ACETAMINOPHEN 500 MG PO TABS: 1000.0000 mg | ORAL_TABLET | ORAL | Status: DC

## 2016-08-11 MED ORDER — LACTATED RINGERS IV SOLN
INTRAVENOUS | Status: DC
  Administered 2016-08-11: 10 mL/h via INTRAVENOUS

## 2016-08-11 MED ORDER — TRAMADOL HCL 50 MG PO TABS
50.0000 mg | ORAL_TABLET | Freq: Once | ORAL | Status: AC
  Administered 2016-08-11: 50 mg via ORAL

## 2016-08-11 MED ORDER — EPHEDRINE SULFATE 50 MG/ML IJ SOLN
INTRAMUSCULAR | Status: DC | PRN
Start: 2016-08-11 — End: 2016-08-11
  Administered 2016-08-11 (×2): 10 mg via INTRAVENOUS

## 2016-08-11 MED ORDER — CEFAZOLIN SODIUM-DEXTROSE 2-4 GM/100ML-% IV SOLN
INTRAVENOUS | Status: AC
  Filled 2016-08-11: qty 100

## 2016-08-11 MED ORDER — FENTANYL CITRATE (PF) 100 MCG/2ML IJ SOLN
INTRAMUSCULAR | Status: AC
  Filled 2016-08-11: qty 2

## 2016-08-11 MED ORDER — CEFAZOLIN SODIUM-DEXTROSE 2-4 GM/100ML-% IV SOLN: 2.0000 g | INTRAVENOUS | Status: DC

## 2016-08-11 MED ORDER — PROPOFOL 10 MG/ML IV BOLUS
INTRAVENOUS | Status: DC | PRN
  Administered 2016-08-11: 150 mg via INTRAVENOUS

## 2016-08-11 MED ORDER — SCOPOLAMINE 1 MG/3DAYS TD PT72
1.0000 | MEDICATED_PATCH | TRANSDERMAL | Status: DC
  Administered 2016-08-11: 1.5 mg via TRANSDERMAL

## 2016-08-11 MED ORDER — GABAPENTIN 300 MG PO CAPS
300.0000 mg | ORAL_CAPSULE | ORAL | Status: AC
  Administered 2016-08-11: 300 mg via ORAL

## 2016-08-11 MED ORDER — TRAMADOL HCL 50 MG PO TABS
ORAL_TABLET | ORAL | Status: AC
  Filled 2016-08-11: qty 1

## 2016-08-11 MED ORDER — DEXAMETHASONE SODIUM PHOSPHATE 4 MG/ML IJ SOLN
INTRAMUSCULAR | Status: DC | PRN
  Administered 2016-08-11: 10 mg via INTRAVENOUS

## 2016-08-11 MED ORDER — ONDANSETRON HCL 4 MG/2ML IJ SOLN: 4.0000 mg | Freq: Once | INTRAMUSCULAR | Status: DC | PRN

## 2016-08-11 MED ORDER — MEPERIDINE HCL 25 MG/ML IJ SOLN: 6.2500 mg | INTRAMUSCULAR | Status: DC | PRN

## 2016-08-11 MED ORDER — GABAPENTIN 300 MG PO CAPS: 300.0000 mg | ORAL_CAPSULE | ORAL | Status: DC

## 2016-08-11 MED ORDER — LIDOCAINE 2% (20 MG/ML) 5 ML SYRINGE
INTRAMUSCULAR | Status: DC | PRN
  Administered 2016-08-11: 100 mg via INTRAVENOUS

## 2016-08-11 MED ORDER — FENTANYL CITRATE (PF) 100 MCG/2ML IJ SOLN
50.0000 ug | INTRAMUSCULAR | Status: AC | PRN
  Administered 2016-08-11: 100 ug via INTRAVENOUS
  Administered 2016-08-11: 25 ug via INTRAVENOUS
  Administered 2016-08-11: 50 ug via INTRAVENOUS
  Administered 2016-08-11: 25 ug via INTRAVENOUS

## 2016-08-11 MED ORDER — BUPIVACAINE HCL (PF) 0.25 % IJ SOLN
INTRAMUSCULAR | Status: DC | PRN
  Administered 2016-08-11: 16 mL

## 2016-08-11 MED ORDER — HYDROMORPHONE HCL 1 MG/ML IJ SOLN: 0.2500 mg | INTRAMUSCULAR | Status: DC | PRN

## 2016-08-11 MED ORDER — TRAMADOL HCL 50 MG PO TABS: 50.0000 mg | ORAL_TABLET | Freq: Four times a day (QID) | ORAL | 0 refills | Status: DC | PRN

## 2016-08-11 MED ORDER — CHLORHEXIDINE GLUCONATE CLOTH 2 % EX PADS: 6.0000 | MEDICATED_PAD | Freq: Once | CUTANEOUS | Status: DC

## 2016-08-11 MED ORDER — CEFAZOLIN SODIUM-DEXTROSE 2-4 GM/100ML-% IV SOLN
2.0000 g | INTRAVENOUS | Status: AC
  Administered 2016-08-11: 2 g via INTRAVENOUS

## 2016-08-11 MED ORDER — ACETAMINOPHEN 500 MG PO TABS
1000.0000 mg | ORAL_TABLET | ORAL | Status: AC
  Administered 2016-08-11: 1000 mg via ORAL

## 2016-08-11 MED ORDER — ONDANSETRON HCL 4 MG/2ML IJ SOLN
INTRAMUSCULAR | Status: DC | PRN
  Administered 2016-08-11: 4 mg via INTRAVENOUS

## 2016-08-11 MED ORDER — ACETAMINOPHEN 500 MG PO TABS
ORAL_TABLET | ORAL | Status: AC
  Filled 2016-08-11: qty 2

## 2016-08-11 MED ORDER — PROPOFOL 10 MG/ML IV BOLUS
INTRAVENOUS | Status: AC
  Filled 2016-08-11: qty 20

## 2016-08-11 MED ORDER — TECHNETIUM TC 99M SULFUR COLLOID FILTERED
1.0000 | Freq: Once | INTRAVENOUS | Status: AC | PRN
  Administered 2016-08-11: 1 via INTRADERMAL

## 2016-08-11 MED ORDER — LIDOCAINE 2% (20 MG/ML) 5 ML SYRINGE
INTRAMUSCULAR | Status: AC
  Filled 2016-08-11: qty 5

## 2016-08-11 MED ORDER — DEXAMETHASONE SODIUM PHOSPHATE 10 MG/ML IJ SOLN
INTRAMUSCULAR | Status: AC
  Filled 2016-08-11: qty 1

## 2016-08-11 MED ORDER — MIDAZOLAM HCL 2 MG/2ML IJ SOLN
INTRAMUSCULAR | Status: AC
  Filled 2016-08-11: qty 2

## 2016-08-11 MED ORDER — ONDANSETRON HCL 4 MG/2ML IJ SOLN
INTRAMUSCULAR | Status: AC
Start: 2016-08-11 — End: 2016-08-11
  Filled 2016-08-11: qty 2

## 2016-08-11 MED ORDER — GABAPENTIN 300 MG PO CAPS
ORAL_CAPSULE | ORAL | Status: AC
  Filled 2016-08-11: qty 1

## 2016-08-11 MED ORDER — SCOPOLAMINE 1 MG/3DAYS TD PT72
MEDICATED_PATCH | TRANSDERMAL | Status: AC
  Filled 2016-08-11: qty 1

## 2016-08-11 SURGICAL SUPPLY — 60 items
ADH SKN CLS APL DERMABOND .7 (GAUZE/BANDAGES/DRESSINGS) ×1
BINDER BREAST LRG (GAUZE/BANDAGES/DRESSINGS) IMPLANT
BINDER BREAST MEDIUM (GAUZE/BANDAGES/DRESSINGS) IMPLANT
BINDER BREAST XLRG (GAUZE/BANDAGES/DRESSINGS) IMPLANT
BINDER BREAST XXLRG (GAUZE/BANDAGES/DRESSINGS) ×2 IMPLANT
BLADE SURG 15 STRL LF DISP TIS (BLADE) ×1 IMPLANT
BLADE SURG 15 STRL SS (BLADE) ×3
CANISTER SUC SOCK COL 7IN (MISCELLANEOUS) IMPLANT
CANISTER SUCT 1200ML W/VALVE (MISCELLANEOUS) ×3 IMPLANT
CHLORAPREP W/TINT 26ML (MISCELLANEOUS) ×3 IMPLANT
CLIP TI WIDE RED SMALL 6 (CLIP) ×3 IMPLANT
CLOSURE WOUND 1/2 X4 (GAUZE/BANDAGES/DRESSINGS) ×1
COVER BACK TABLE 60X90IN (DRAPES) ×3 IMPLANT
COVER MAYO STAND STRL (DRAPES) ×3 IMPLANT
COVER PROBE W GEL 5X96 (DRAPES) ×3 IMPLANT
DECANTER SPIKE VIAL GLASS SM (MISCELLANEOUS) IMPLANT
DERMABOND ADVANCED (GAUZE/BANDAGES/DRESSINGS) ×2
DERMABOND ADVANCED .7 DNX12 (GAUZE/BANDAGES/DRESSINGS) ×1 IMPLANT
DEVICE DUBIN W/COMP PLATE 8390 (MISCELLANEOUS) ×5 IMPLANT
DRAPE LAPAROSCOPIC ABDOMINAL (DRAPES) ×3 IMPLANT
DRAPE UTILITY XL STRL (DRAPES) ×3 IMPLANT
DRSG TEGADERM 4X4.75 (GAUZE/BANDAGES/DRESSINGS) IMPLANT
ELECT COATED BLADE 2.86 ST (ELECTRODE) ×3 IMPLANT
ELECT REM PT RETURN 9FT ADLT (ELECTROSURGICAL) ×3
ELECTRODE REM PT RTRN 9FT ADLT (ELECTROSURGICAL) ×1 IMPLANT
GLOVE BIO SURGEON STRL SZ7 (GLOVE) ×8 IMPLANT
GLOVE BIOGEL PI IND STRL 7.5 (GLOVE) ×1 IMPLANT
GLOVE BIOGEL PI INDICATOR 7.5 (GLOVE) ×2
GOWN STRL REUS W/ TWL LRG LVL3 (GOWN DISPOSABLE) ×2 IMPLANT
GOWN STRL REUS W/TWL LRG LVL3 (GOWN DISPOSABLE) ×9
HEMOSTAT ARISTA ABSORB 3G PWDR (MISCELLANEOUS) ×3 IMPLANT
ILLUMINATOR WAVEGUIDE N/F (MISCELLANEOUS) ×3 IMPLANT
KIT MARKER MARGIN INK (KITS) ×3 IMPLANT
LIGHT WAVEGUIDE WIDE FLAT (MISCELLANEOUS) IMPLANT
NDL HYPO 25X1 1.5 SAFETY (NEEDLE) ×1 IMPLANT
NDL SAFETY ECLIPSE 18X1.5 (NEEDLE) IMPLANT
NEEDLE HYPO 18GX1.5 SHARP (NEEDLE)
NEEDLE HYPO 25X1 1.5 SAFETY (NEEDLE) ×3 IMPLANT
NS IRRIG 1000ML POUR BTL (IV SOLUTION) ×2 IMPLANT
PACK BASIN DAY SURGERY FS (CUSTOM PROCEDURE TRAY) ×3 IMPLANT
PENCIL BUTTON HOLSTER BLD 10FT (ELECTRODE) ×3 IMPLANT
SLEEVE SCD COMPRESS KNEE MED (MISCELLANEOUS) ×3 IMPLANT
SPONGE GAUZE 4X4 12PLY STER LF (GAUZE/BANDAGES/DRESSINGS) IMPLANT
SPONGE LAP 4X18 X RAY DECT (DISPOSABLE) ×5 IMPLANT
STRIP CLOSURE SKIN 1/2X4 (GAUZE/BANDAGES/DRESSINGS) ×2 IMPLANT
SUT ETHILON 2 0 FS 18 (SUTURE) IMPLANT
SUT MNCRL AB 4-0 PS2 18 (SUTURE) ×3 IMPLANT
SUT MON AB 5-0 PS2 18 (SUTURE) IMPLANT
SUT SILK 2 0 SH (SUTURE) ×2 IMPLANT
SUT VIC AB 2-0 SH 27 (SUTURE) ×9
SUT VIC AB 2-0 SH 27XBRD (SUTURE) ×1 IMPLANT
SUT VIC AB 3-0 SH 27 (SUTURE) ×6
SUT VIC AB 3-0 SH 27X BRD (SUTURE) ×1 IMPLANT
SUT VIC AB 5-0 PS2 18 (SUTURE) IMPLANT
SYR CONTROL 10ML LL (SYRINGE) ×3 IMPLANT
TOWEL OR 17X24 6PK STRL BLUE (TOWEL DISPOSABLE) ×3 IMPLANT
TOWEL OR NON WOVEN STRL DISP B (DISPOSABLE) ×1 IMPLANT
TUBE CONNECTING 20'X1/4 (TUBING) ×1
TUBE CONNECTING 20X1/4 (TUBING) ×1 IMPLANT
YANKAUER SUCT BULB TIP NO VENT (SUCTIONS) ×2 IMPLANT

## 2016-08-11 NOTE — Anesthesia Preprocedure Evaluation (Signed)
Anesthesia Evaluation  Patient identified by MRN, date of birth, ID band Patient awake    Reviewed: Allergy & Precautions, NPO status , Patient's Chart, lab work & pertinent test results  History of Anesthesia Complications (+) PONV  Airway Mallampati: I  TM Distance: >3 FB Neck ROM: Full    Dental   Pulmonary former smoker,    Pulmonary exam normal        Cardiovascular Normal cardiovascular exam     Neuro/Psych    GI/Hepatic   Endo/Other    Renal/GU      Musculoskeletal   Abdominal   Peds  Hematology   Anesthesia Other Findings   Reproductive/Obstetrics                             Anesthesia Physical Anesthesia Plan  ASA: II  Anesthesia Plan: General   Post-op Pain Management:  Regional for Post-op pain   Induction: Intravenous  Airway Management Planned: LMA  Additional Equipment:   Intra-op Plan:   Post-operative Plan: Extubation in OR  Informed Consent: I have reviewed the patients History and Physical, chart, labs and discussed the procedure including the risks, benefits and alternatives for the proposed anesthesia with the patient or authorized representative who has indicated his/her understanding and acceptance.     Plan Discussed with: CRNA and Surgeon  Anesthesia Plan Comments:         Anesthesia Quick Evaluation

## 2016-08-11 NOTE — Discharge Instructions (Signed)
[facility] °Office Phone Number [phone] ° °POST OP INSTRUCTIONS ° °Always review your discharge instruction sheet given to you by the facility where your surgery was performed. ° °IF YOU HAVE DISABILITY OR FAMILY LEAVE FORMS, YOU MUST BRING THEM TO THE OFFICE FOR PROCESSING.  DO NOT GIVE THEM TO YOUR DOCTOR. ° °1. A prescription for pain medication may be given to you upon discharge.  Take your pain medication as prescribed, if needed.  If narcotic pain medicine is not needed, then you may take acetaminophen (Tylenol), naprosyn (Alleve) or ibuprofen (Advil) as needed. °2. Take your usually prescribed medications unless otherwise directed °3. If you need a refill on your pain medication, please contact your pharmacy.  They will contact our office to request authorization.  Prescriptions will not be filled after 5pm or on week-ends. °4. You should eat very light the first 24 hours after surgery, such as soup, crackers, pudding, etc.  Resume your normal diet the day after surgery. °5. Most patients will experience some swelling and bruising in the breast.  Ice packs and a good support bra will help.  Wear the breast binder provided or a sports bra for 72 hours day and night.  After that wear a sports bra during the day until you return to the office. Swelling and bruising can take several days to resolve.  °6. It is common to experience some constipation if taking pain medication after surgery.  Increasing fluid intake and taking a stool softener will usually help or prevent this problem from occurring.  A mild laxative (Milk of Magnesia or Miralax) should be taken according to package directions if there are no bowel movements after 48 hours. °7. Unless discharge instructions indicate otherwise, you may remove your bandages 48 hours after surgery and you may shower at that time.  You may have steri-strips (small skin tapes) in place directly over the incision.  These strips should be left on the  skin for 7-10 days and will come off on their own.  If your surgeon used skin glue on the incision, you may shower in 24 hours.  The glue will flake off over the next 2-3 weeks.  Any sutures or staples will be removed at the office during your follow-up visit. °8. ACTIVITIES:  You may resume regular daily activities (gradually increasing) beginning the next day.  Wearing a good support bra or sports bra minimizes pain and swelling.  You may have sexual intercourse when it is comfortable. °a. You may drive when you no longer are taking prescription pain medication, you can comfortably wear a seatbelt, and you can safely maneuver your car and apply brakes. °b. RETURN TO WORK:  ______________________________________________________________________________________ °9. You should see your doctor in the office for a follow-up appointment approximately two weeks after your surgery.  Your doctor’s nurse will typically make your follow-up appointment when she calls you with your pathology report.  Expect your pathology report 3-4 business days after your surgery.  You may call to check if you do not hear from us after three days. °10. OTHER INSTRUCTIONS: _______________________________________________________________________________________________ _____________________________________________________________________________________________________________________________________ °_____________________________________________________________________________________________________________________________________ °_____________________________________________________________________________________________________________________________________ ° °WHEN TO CALL DR WAKEFIELD: °1. Fever over 101.0 °2. Nausea and/or vomiting. °3. Extreme swelling or bruising. °4. Continued bleeding from incision. °5. Increased pain, redness, or drainage from the incision. ° °The clinic staff is available to answer your questions during regular  business hours.  Please don’t hesitate to call and ask to speak to one of the nurses for clinical concerns.  If   you have a medical emergency, go to the nearest emergency room or call 911.  A surgeon from [facility] is always on call at the hospital. ° °For further questions, please visit [email or web address] ° ° ° °Post Anesthesia Home Care Instructions ° °Activity: °Get plenty of rest for the remainder of the day. A responsible adult should stay with you for 24 hours following the procedure.  °For the next 24 hours, DO NOT: °-Drive a car °-Operate machinery °-Drink alcoholic beverages °-Take any medication unless instructed by your physician °-Make any legal decisions or sign important papers. ° °Meals: °Start with liquid foods such as gelatin or soup. Progress to regular foods as tolerated. Avoid greasy, spicy, heavy foods. If nausea and/or vomiting occur, drink only clear liquids until the nausea and/or vomiting subsides. Call your physician if vomiting continues. ° °Special Instructions/Symptoms: °Your throat may feel dry or sore from the anesthesia or the breathing tube placed in your throat during surgery. If this causes discomfort, gargle with warm salt water. The discomfort should disappear within 24 hours. ° °If you had a scopolamine patch placed behind your ear for the management of post- operative nausea and/or vomiting: ° °1. The medication in the patch is effective for 72 hours, after which it should be removed.  Wrap patch in a tissue and discard in the trash. Wash hands thoroughly with soap and water. °2. You may remove the patch earlier than 72 hours if you experience unpleasant side effects which may include dry mouth, dizziness or visual disturbances. °3. Avoid touching the patch. Wash your hands with soap and water after contact with the patch. °  ° °

## 2016-08-11 NOTE — Anesthesia Procedure Notes (Signed)
Anesthesia Regional Block:  Pectoralis block  Pre-Anesthetic Checklist: ,, timeout performed, Correct Patient, Correct Site, Correct Laterality, Correct Procedure, Correct Position, site marked, Risks and benefits discussed,  Surgical consent,  Pre-op evaluation,  At surgeon's request and post-op pain management  Laterality: Left  Prep: chloraprep       Needles:  Injection technique: Single-shot     Needle Length: 9cm 9 cm Needle Gauge: 21 and 21 G    Additional Needles:  Procedures: ultrasound guided (picture in chart) Pectoralis block Narrative:  Start time: 08/11/2016 9:50 AM End time: 08/11/2016 10:00 AM Injection made incrementally with aspirations every 5 mL.  Performed by: Personally  Anesthesiologist: Lillia Abed  Additional Notes: Monitors applied. Patient sedated. Sterile prep and drape,hand hygiene and sterile gloves were used. Relevant anatomy identified.Needle position confirmed.Local anesthetic injected incrementally after negative aspiration. Local anesthetic spread visualized. Vascular puncture avoided. No complications. Image printed for medical record.The patient tolerated the procedure well.

## 2016-08-11 NOTE — Transfer of Care (Signed)
Immediate Anesthesia Transfer of Care Note  Patient: Felicia Hampton  Procedure(s) Performed: Procedure(s): RADIOACTIVE SEED GUIDED LEFT BREAST LUMPECTOMY WITH AXILLARY SENTINEL LYMPH NODE BIOPSY (Left)  Patient Location: PACU  Anesthesia Type:GA combined with regional for post-op pain  Level of Consciousness: sedated  Airway & Oxygen Therapy: Patient Spontanous Breathing and Patient connected to face mask oxygen  Post-op Assessment: Report given to RN and Post -op Vital signs reviewed and stable  Post vital signs: Reviewed and stable  Last Vitals:  Vitals:   08/11/16 1010 08/11/16 1237  BP: 93/67 139/79  Pulse: 81 77  Resp: 13   Temp:      Last Pain:  Vitals:   08/11/16 0936  TempSrc: Oral         Complications: No apparent anesthesia complications

## 2016-08-11 NOTE — Progress Notes (Signed)
Assisted Dr. Ossey with left, ultrasound guided, pectoralis block. Side rails up, monitors on throughout procedure. See vital signs in flow sheet. Tolerated Procedure well. 

## 2016-08-11 NOTE — H&P (Signed)
61 yof referred by Dr Dory Horn for newly diagnosed with left breast cancer. she had screening mammogram that showed a left breast abnormality. her density is b. she has no mass or discharge. she has family history of breast cancer in a cousin and an aunt with ovarian cancer. she then had an mri that showed a left breast 8x6x5 mm mass in the central upper left breast that appears to correspond to the distortion on the recent mammograms. there is also a 6x3.7x1.9 cm area of nme in the uoq of the left breast that is 3 cm lateral to the 8 mm mass. both of these areas have been biopsied. the mass is er/pr pos at 100%, Ki is 2% and her2 negative. the clips are about 7.5 cm apart. the nme is alh. she is here today to discuss options  Past Surgical History Illene Regulus, CMA; 07/18/2016 8:20 AM) Appendectomy  Breast Mass; Local Excision  Right. Colon Polyp Removal - Colonoscopy  Colon Removal - Complete  Hysterectomy (not due to cancer) - Partial  Knee Surgery  Bilateral. Shoulder Surgery  Right.  Diagnostic Studies History Illene Regulus, CMA; 07/18/2016 8:20 AM) Colonoscopy  1-5 years ago Mammogram  within last year  Allergies Illene Regulus, CMA; 07/18/2016 8:28 AM) Morphine Sulfate (PF) *ANALGESICS - OPIOID*  Diflucan *ANTIFUNGALS*   Medication History (Alisha Spillers, CMA; 07/18/2016 8:31 AM) Atorvastatin Calcium (20MG Tablet, Oral) Active. Amphetamine-Dextroamphetamine (10MG Tablet, Oral) Active. Zolpidem Tartrate (10MG Tablet, Oral) Active. Meloxicam (15MG Tablet, Oral) Active. Multivitamin Adult (Oral) Active. Vitamin D (2000UNIT Tablet, Oral) Active. Medications Reconciled  Social History Illene Regulus, CMA; 07/18/2016 8:20 AM) Alcohol use  Occasional alcohol use. Caffeine use  Coffee. No drug use  Tobacco use  Former smoker.  Family History Illene Regulus, [place]; 07/18/2016 8:20 AM) Arthritis  Mother. Colon Cancer  Sister. Heart Disease   Mother. Hypertension  Mother, Sister. Melanoma  Brother. Ovarian Cancer  Family Members In General. Prostate Cancer  Father. Rectal Cancer  Family Members In General.  Pregnancy / Birth History Illene Regulus, [place]; 07/18/2016 8:20 AM) Age at menarche  47 years. Age of menopause  51-55 Contraceptive History  Intrauterine device, Oral contraceptives. Gravida  1 Irregular periods  Length (months) of breastfeeding  3-6 Maternal age  58-25 Para  1  Other Problems Illene Regulus, CMA; 07/18/2016 8:20 AM) Melanoma   Review of Systems (Alisha Spillers CMA; 07/18/2016 8:20 AM) General Not Present- Appetite Loss, Chills, Fatigue, Fever, Night Sweats, Weight Gain and Weight Loss. Skin Present- Dryness. Not Present- Change in Wart/Mole, Hives, Jaundice, New Lesions, Non-Healing Wounds, Rash and Ulcer. HEENT Present- Seasonal Allergies. Not Present- Earache, Hearing Loss, Hoarseness, Nose Bleed, Oral Ulcers, Ringing in the Ears, Sinus Pain, Sore Throat, Visual Disturbances, Wears glasses/contact lenses and Yellow Eyes. Breast Present- Breast Mass and Breast Pain. Not Present- Nipple Discharge and Skin Changes. Cardiovascular Not Present- Chest Pain, Difficulty Breathing Lying Down, Leg Cramps, Palpitations, Rapid Heart Rate, Shortness of Breath and Swelling of Extremities. Gastrointestinal Not Present- Abdominal Pain, Bloating, Bloody Stool, Change in Bowel Habits, Chronic diarrhea, Constipation, Difficulty Swallowing, Excessive gas, Gets full quickly at meals, Hemorrhoids, Indigestion, Nausea, Rectal Pain and Vomiting. Female Genitourinary Not Present- Frequency, Nocturia, Painful Urination, Pelvic Pain and Urgency. Musculoskeletal Present- Joint Stiffness. Not Present- Back Pain, Joint Pain, Muscle Pain, Muscle Weakness and Swelling of Extremities. Neurological Not Present- Decreased Memory, Fainting, Headaches, Numbness, Seizures, Tingling, Tremor, Trouble walking and  Weakness. Psychiatric Not Present- Anxiety, Bipolar, Change in Sleep Pattern, Depression, Fearful and  Frequent crying. Endocrine Not Present- Cold Intolerance, Excessive Hunger, Hair Changes, Heat Intolerance, Hot flashes and New Diabetes. Hematology Not Present- Blood Thinners, Easy Bruising, Excessive bleeding, Gland problems, HIV and Persistent Infections.  Vitals (Alisha Spillers CMA; 07/18/2016 8:28 AM) 07/18/2016 8:27 AM Weight: 183 lb Height: 62in Body Surface Area: 1.84 m Body Mass Index: 33.47 kg/m  Pulse: 70 (Regular)  BP: 110/68 (Sitting, Left Arm, Standard) Physical Exam Rolm Bookbinder MD; 07/18/2016 9:25 AM) General Mental Status-Alert. Orientation-Oriented X3. Eye Sclera/Conjunctiva - Bilateral-No scleral icterus. Chest and Lung Exam Chest and lung exam reveals -on auscultation, normal breath sounds, no adventitious sounds and normal vocal resonance. Breast Nipples-No Discharge. Breast Lump-No Palpable Breast Mass. Cardiovascular Cardiovascular examination reveals -normal heart sounds, regular rate and rhythm with no murmurs. Lymphatic Head & Neck General Head & Neck Lymphatics: Bilateral - Description - Normal. Axillary General Axillary Region: Bilateral - Description - Normal. Note: no Deming adenopathy   Assessment & Plan Rolm Bookbinder MD; 07/18/2016 9:39 AM) BREAST CANCER OF UPPER-OUTER QUADRANT OF LEFT FEMALE BREAST (C50.412) We discussed the staging and pathophysiology of breast cancer. We discussed all of the different options for treatment for breast cancer including surgery, chemotherapy, radiation therapy, Herceptin, and antiestrogen therapy. We discussed a sentinel lymph node biopsy as she does not appear to having lymph node involvement right now. We discussed the performance of that with injection of radioactive tracer. We discussed that there is a chance of having a positive node with a sentinel lymph node biopsy and we will await  the permanent pathology to make any other first further decisions in terms of her treatment. One of these options might be to return to the operating room to perform an axillary lymph node dissection. We discussed up to a 5% risk lifetime of chronic shoulder pain as well as lymphedema associated with a sentinel lymph node biopsy. We discussed the options for treatment of the breast cancer which included lumpectomy versus a mastectomy. We discussed the performance of the lumpectomy with radioactive seed placement. We discussed a 5-10% chance of a positive margin requiring reexcision in the operating room. We also discussed that she will need radiation therapy if she undergoes lumpectomy. We discussed the mastectomy (removal of whole breast) including nsm. Mastectomy can be followed by reconstruction. The decision for lumpectomy vs mastectomy has no impact on decision for chemotherapy. Most mastectomy patients will not need radiation therapy. We discussed that there is no difference in her survival whether she undergoes lumpectomy with radiation therapy or antiestrogen therapy versus a mastectomy. I think she would also be candidate for reduction lumpectomy with plastic surgery due to area of alh. will have her see plastics  ATYPICAL LOBULAR HYPERPLASIA (ALH) OF LEFT BREAST (N60.92) Story: this area needs to be excised due to other cancer and alh.

## 2016-08-11 NOTE — Interval H&P Note (Signed)
History and Physical Interval Note:  08/11/2016 10:46 AM  Felicia Hampton  has presented today for surgery, with the diagnosis of LEFT BREAST CANCER  The various methods of treatment have been discussed with the patient and family. After consideration of risks, benefits and other options for treatment, the patient has consented to  Procedure(s): RADIOACTIVE SEED GUIDED LEFT BREAST LUMPECTOMY WITH AXILLARY SENTINEL LYMPH NODE BIOPSY (Left) RADIOACTIVE SEED GUIDED EXCISIONAL LEFT  BREAST BIOPSY (Left) as a surgical intervention .  The patient's history has been reviewed, patient examined, no change in status, stable for surgery.  I have reviewed the patient's chart and labs.  Questions were answered to the patient's satisfaction.     Gerhard Rappaport

## 2016-08-11 NOTE — Anesthesia Postprocedure Evaluation (Signed)
Anesthesia Post Note  Patient: Felicia Hampton  Procedure(s) Performed: Procedure(s) (LRB): RADIOACTIVE SEED GUIDED LEFT BREAST LUMPECTOMY WITH AXILLARY SENTINEL LYMPH NODE BIOPSY (Left)  Patient location during evaluation: PACU Anesthesia Type: General Level of consciousness: awake and alert Pain management: pain level controlled Vital Signs Assessment: post-procedure vital signs reviewed and stable Respiratory status: spontaneous breathing, nonlabored ventilation, respiratory function stable and patient connected to nasal cannula oxygen Cardiovascular status: blood pressure returned to baseline and stable Postop Assessment: no signs of nausea or vomiting Anesthetic complications: no       Last Vitals:  Vitals:   08/11/16 1300 08/11/16 1315  BP: 122/73 117/70  Pulse: 77 75  Resp: 16 (!) 21  Temp:      Last Pain:  Vitals:   08/11/16 0936  TempSrc: Oral                 Markes Shatswell DAVID

## 2016-08-11 NOTE — Anesthesia Procedure Notes (Signed)
Procedure Name: LMA Insertion Date/Time: 08/11/2016 11:09 AM Performed by: Maryella Shivers Pre-anesthesia Checklist: Patient identified, Emergency Drugs available, Suction available and Patient being monitored Patient Re-evaluated:Patient Re-evaluated prior to inductionOxygen Delivery Method: Circle system utilized Preoxygenation: Pre-oxygenation with 100% oxygen Intubation Type: IV induction Ventilation: Mask ventilation without difficulty LMA: LMA inserted LMA Size: 4.0 Number of attempts: 1 Airway Equipment and Method: Bite block Placement Confirmation: positive ETCO2 Tube secured with: Tape Dental Injury: Teeth and Oropharynx as per pre-operative assessment

## 2016-08-11 NOTE — Op Note (Signed)
Preoperative diagnosis: Left breast cancer, clinical stage I/left breast ALH Postoperative diagnosis: same as above Procedure: 1. Left breast seed guided lumpectomy 2. Left breast seed guided excisional biopsy 3. Left axillary sentinel node biopsy Surgeon Dr Serita Grammes Anes general  EBL: minimal Comps none Specimen:  1. Leftbreast cancer marked with paint 2. Left breast seed guided excisional biopsy marked with paint 3. Additional medial/posterior margin marked short superior, long lateral, double deep/from specimen one 4. Left axillary sentinel nodes with count of 400 Sponge count correct at completion Dispoto recovery stable  Indications: This is a 81 yof who has newly diagnosed clinical stage I left breast cancer. We discussed seed guided lumpectomy/axillary sentinel node biopsy.  She had another area on MRI that is alh in an area of nme. I discussed with radiology and we have elected to excise this area and not the benign biopsy. She had a seed placed in this area.  I had the mammograms available in OR.  Procedure: After informed consent was obtained the patient was taken to the OR. She was injected with technetium in the standard periareolar fashion.She was given anitibiotics. SCDs were in place. She was prepped and draped in the standard sterile surgical fashion. A timeout was performed. She had undergone a pectoral block. I then located the seed in the left superiorl breast. She had been marked by Dr Iran Planas. I then removed the seedwith an attempt to get a clear margin. This waspassed off the table after marking with paint. I did remove additional medial and posterior margin as well.  the deep margin is the muscle. I did have pathology review these grossly and these margins appeared close. I did confirm removal of theclipand seed with mammography.the clip wasdirectly in center of specimen.I placed clips in the cavity.  I obtained hemostasis. I closed this with  2-0 vicryl, 3-0 vicryl and 4-0 monocryl. Glue was applied.  I then inftitrated marcaine in axilla. I made an incision below hairline and carried this through fascia.  I identified the sentinel nodes with the highest count as above. There was no gross nodal disease.   This was confirmed by mammography.There was no background radioactivity. I obtained hemostasis.I also tracked into the breast tissue via this incision to remove the seed containing area.  I used lighted retractors and removed the seed and a generous excisional biopsy given mri findings.  I passed this off after marking with paint.  Mammogram confirmed removal of the clip and seed.  Hemostasis was obtained.   I then closed the fascia with 2-0 vicryl. I closed the skin with 3-0 vicryl and 4-0 monocryl. Glue and steristrips were placed A breast binder was placed. She was extubated and transferred to recovery stable

## 2016-08-12 ENCOUNTER — Encounter (HOSPITAL_BASED_OUTPATIENT_CLINIC_OR_DEPARTMENT_OTHER): Payer: Self-pay | Admitting: General Surgery

## 2016-08-17 ENCOUNTER — Encounter (HOSPITAL_BASED_OUTPATIENT_CLINIC_OR_DEPARTMENT_OTHER): Payer: Self-pay | Admitting: *Deleted

## 2016-08-18 ENCOUNTER — Encounter: Payer: Self-pay | Admitting: Radiation Oncology

## 2016-08-22 NOTE — Anesthesia Preprocedure Evaluation (Addendum)
Anesthesia Evaluation  Patient identified by MRN, date of birth, ID band Patient awake    Reviewed: Allergy & Precautions, NPO status , Patient's Chart, lab work & pertinent test results  History of Anesthesia Complications (+) PONV and history of anesthetic complications  Airway Mallampati: II  TM Distance: >3 FB Neck ROM: Full    Dental  (+) Teeth Intact, Dental Advisory Given, Caps,    Pulmonary former smoker,    Pulmonary exam normal breath sounds clear to auscultation       Cardiovascular Exercise Tolerance: Good negative cardio ROS Normal cardiovascular exam Rhythm:Regular Rate:Normal     Neuro/Psych negative neurological ROS  negative psych ROS   GI/Hepatic negative GI ROS, Neg liver ROS,   Endo/Other  Obesity   Renal/GU negative Renal ROS     Musculoskeletal negative musculoskeletal ROS (+)   Abdominal   Peds  (+) ADHD Hematology negative hematology ROS (+)   Anesthesia Other Findings Day of surgery medications reviewed with the patient.  Left breast cancer s/p mastectomy  Reproductive/Obstetrics                           Anesthesia Physical Anesthesia Plan  ASA: II  Anesthesia Plan: General   Post-op Pain Management:    Induction: Intravenous  Airway Management Planned: Oral ETT  Additional Equipment:   Intra-op Plan:   Post-operative Plan: Extubation in OR  Informed Consent: I have reviewed the patients History and Physical, chart, labs and discussed the procedure including the risks, benefits and alternatives for the proposed anesthesia with the patient or authorized representative who has indicated his/her understanding and acceptance.   Dental advisory given  Plan Discussed with: CRNA  Anesthesia Plan Comments:        Anesthesia Quick Evaluation

## 2016-08-23 ENCOUNTER — Ambulatory Visit (HOSPITAL_BASED_OUTPATIENT_CLINIC_OR_DEPARTMENT_OTHER): Payer: Managed Care, Other (non HMO) | Admitting: Anesthesiology

## 2016-08-23 ENCOUNTER — Encounter (HOSPITAL_BASED_OUTPATIENT_CLINIC_OR_DEPARTMENT_OTHER)
Admission: RE | Disposition: A | Discharge status: Home / Self Care | Payer: Self-pay | Source: Ambulatory Visit | Attending: Plastic Surgery

## 2016-08-23 ENCOUNTER — Encounter (HOSPITAL_BASED_OUTPATIENT_CLINIC_OR_DEPARTMENT_OTHER): Payer: Self-pay | Admitting: *Deleted

## 2016-08-23 ENCOUNTER — Ambulatory Visit (HOSPITAL_BASED_OUTPATIENT_CLINIC_OR_DEPARTMENT_OTHER)
Admission: RE | Admit: 2016-08-23 | Discharge: 2016-08-23 | Disposition: A | Discharge status: Home / Self Care | Payer: Managed Care, Other (non HMO) | Source: Ambulatory Visit | Attending: Plastic Surgery | Admitting: Plastic Surgery

## 2016-08-23 DIAGNOSIS — E669 Obesity, unspecified: Secondary | ICD-10-CM | POA: Diagnosis not present

## 2016-08-23 DIAGNOSIS — Z6834 Body mass index (BMI) 34.0-34.9, adult: Secondary | ICD-10-CM | POA: Insufficient documentation

## 2016-08-23 DIAGNOSIS — N62 Hypertrophy of breast: Secondary | ICD-10-CM | POA: Insufficient documentation

## 2016-08-23 DIAGNOSIS — Z170 Estrogen receptor positive status [ER+]: Secondary | ICD-10-CM | POA: Diagnosis not present

## 2016-08-23 DIAGNOSIS — Z87891 Personal history of nicotine dependence: Secondary | ICD-10-CM | POA: Diagnosis not present

## 2016-08-23 DIAGNOSIS — C50212 Malignant neoplasm of upper-inner quadrant of left female breast: Secondary | ICD-10-CM | POA: Diagnosis present

## 2016-08-23 HISTORY — PX: BREAST REDUCTION SURGERY: SHX8

## 2016-08-23 SURGERY — MAMMOPLASTY, REDUCTION
Anesthesia: General | Laterality: Bilateral

## 2016-08-23 MED ORDER — SCOPOLAMINE 1 MG/3DAYS TD PT72
1.0000 | MEDICATED_PATCH | Freq: Once | TRANSDERMAL | Status: DC | PRN
  Administered 2016-08-23: 1.5 mg via TRANSDERMAL

## 2016-08-23 MED ORDER — PROPOFOL 10 MG/ML IV BOLUS
INTRAVENOUS | Status: DC | PRN
  Administered 2016-08-23: 150 mg via INTRAVENOUS

## 2016-08-23 MED ORDER — GABAPENTIN 300 MG PO CAPS
300.0000 mg | ORAL_CAPSULE | ORAL | Status: AC
  Administered 2016-08-23: 300 mg via ORAL

## 2016-08-23 MED ORDER — ONDANSETRON HCL 4 MG/2ML IJ SOLN
INTRAMUSCULAR | Status: AC
  Filled 2016-08-23: qty 2

## 2016-08-23 MED ORDER — DEXAMETHASONE SODIUM PHOSPHATE 4 MG/ML IJ SOLN
INTRAMUSCULAR | Status: DC | PRN
  Administered 2016-08-23: 10 mg via INTRAVENOUS

## 2016-08-23 MED ORDER — CEFAZOLIN SODIUM-DEXTROSE 2-4 GM/100ML-% IV SOLN
2.0000 g | INTRAVENOUS | Status: AC
  Administered 2016-08-23: 2 g via INTRAVENOUS

## 2016-08-23 MED ORDER — BUPIVACAINE LIPOSOME 1.3 % IJ SUSP
INTRAMUSCULAR | Status: AC
  Filled 2016-08-23: qty 20

## 2016-08-23 MED ORDER — SUGAMMADEX SODIUM 200 MG/2ML IV SOLN
INTRAVENOUS | Status: AC
  Filled 2016-08-23: qty 2

## 2016-08-23 MED ORDER — 0.9 % SODIUM CHLORIDE (POUR BTL) OPTIME
TOPICAL | Status: DC | PRN
  Administered 2016-08-23: 1000 mL

## 2016-08-23 MED ORDER — ROCURONIUM BROMIDE 100 MG/10ML IV SOLN
INTRAVENOUS | Status: DC | PRN
  Administered 2016-08-23: 50 mg via INTRAVENOUS

## 2016-08-23 MED ORDER — MIDAZOLAM HCL 2 MG/2ML IJ SOLN
INTRAMUSCULAR | Status: AC
  Filled 2016-08-23: qty 2

## 2016-08-23 MED ORDER — SUGAMMADEX SODIUM 200 MG/2ML IV SOLN
INTRAVENOUS | Status: DC | PRN
  Administered 2016-08-23: 200 mg via INTRAVENOUS

## 2016-08-23 MED ORDER — CHLORHEXIDINE GLUCONATE CLOTH 2 % EX PADS: 6.0000 | MEDICATED_PAD | Freq: Once | CUTANEOUS | Status: DC

## 2016-08-23 MED ORDER — SUCCINYLCHOLINE CHLORIDE 200 MG/10ML IV SOSY
PREFILLED_SYRINGE | INTRAVENOUS | Status: AC
  Filled 2016-08-23: qty 10

## 2016-08-23 MED ORDER — LACTATED RINGERS IV SOLN
INTRAVENOUS | Status: DC
  Administered 2016-08-23 (×3): via INTRAVENOUS

## 2016-08-23 MED ORDER — EPHEDRINE SULFATE 50 MG/ML IJ SOLN
INTRAMUSCULAR | Status: DC | PRN
  Administered 2016-08-23: 10 mg via INTRAVENOUS

## 2016-08-23 MED ORDER — FENTANYL CITRATE (PF) 100 MCG/2ML IJ SOLN
INTRAMUSCULAR | Status: AC
  Filled 2016-08-23: qty 2

## 2016-08-23 MED ORDER — FENTANYL CITRATE (PF) 100 MCG/2ML IJ SOLN: 25.0000 ug | INTRAMUSCULAR | Status: DC | PRN

## 2016-08-23 MED ORDER — PROPOFOL 500 MG/50ML IV EMUL
INTRAVENOUS | Status: DC | PRN
  Administered 2016-08-23: 25 ug/kg/min via INTRAVENOUS

## 2016-08-23 MED ORDER — MIDAZOLAM HCL 2 MG/2ML IJ SOLN
1.0000 mg | INTRAMUSCULAR | Status: DC | PRN
  Administered 2016-08-23: 2 mg via INTRAVENOUS

## 2016-08-23 MED ORDER — ACETAMINOPHEN 500 MG PO TABS
1000.0000 mg | ORAL_TABLET | ORAL | Status: AC
  Administered 2016-08-23: 1000 mg via ORAL

## 2016-08-23 MED ORDER — PROMETHAZINE HCL 25 MG/ML IJ SOLN: 6.2500 mg | INTRAMUSCULAR | Status: DC | PRN

## 2016-08-23 MED ORDER — EPHEDRINE 5 MG/ML INJ
INTRAVENOUS | Status: AC
Start: 2016-08-23 — End: 2016-08-23
  Filled 2016-08-23: qty 10

## 2016-08-23 MED ORDER — SCOPOLAMINE 1 MG/3DAYS TD PT72
MEDICATED_PATCH | TRANSDERMAL | Status: AC
  Filled 2016-08-23: qty 1

## 2016-08-23 MED ORDER — LIDOCAINE 2% (20 MG/ML) 5 ML SYRINGE
INTRAMUSCULAR | Status: AC
Start: 2016-08-23 — End: 2016-08-23
  Filled 2016-08-23: qty 5

## 2016-08-23 MED ORDER — ACETAMINOPHEN 500 MG PO TABS
ORAL_TABLET | ORAL | Status: AC
  Filled 2016-08-23: qty 2

## 2016-08-23 MED ORDER — SODIUM CHLORIDE 0.9 % IV SOLN
INTRAVENOUS | Status: DC | PRN
  Administered 2016-08-23 (×2): 20 mL

## 2016-08-23 MED ORDER — CEFAZOLIN SODIUM-DEXTROSE 2-4 GM/100ML-% IV SOLN
INTRAVENOUS | Status: AC
  Filled 2016-08-23: qty 100

## 2016-08-23 MED ORDER — LIDOCAINE HCL (CARDIAC) 20 MG/ML IV SOLN
INTRAVENOUS | Status: DC | PRN
  Administered 2016-08-23: 100 mg via INTRAVENOUS

## 2016-08-23 MED ORDER — FENTANYL CITRATE (PF) 100 MCG/2ML IJ SOLN
50.0000 ug | INTRAMUSCULAR | Status: AC | PRN
  Administered 2016-08-23: 100 ug via INTRAVENOUS
  Administered 2016-08-23 (×2): 50 ug via INTRAVENOUS

## 2016-08-23 MED ORDER — TRAMADOL HCL 50 MG PO TABS: 50.0000 mg | ORAL_TABLET | Freq: Four times a day (QID) | ORAL | 0 refills | Status: DC | PRN

## 2016-08-23 MED ORDER — GABAPENTIN 300 MG PO CAPS
ORAL_CAPSULE | ORAL | Status: AC
  Filled 2016-08-23: qty 1

## 2016-08-23 MED ORDER — DEXAMETHASONE SODIUM PHOSPHATE 10 MG/ML IJ SOLN
INTRAMUSCULAR | Status: AC
  Filled 2016-08-23: qty 1

## 2016-08-23 SURGICAL SUPPLY — 61 items
APPLIER CLIP 9.375 MED OPEN (MISCELLANEOUS) ×2
APR CLP MED 9.3 20 MLT OPN (MISCELLANEOUS) ×1
BAG DECANTER FOR FLEXI CONT (MISCELLANEOUS) IMPLANT
BINDER BREAST 3XL (BIND) IMPLANT
BINDER BREAST LRG (GAUZE/BANDAGES/DRESSINGS) IMPLANT
BINDER BREAST MEDIUM (GAUZE/BANDAGES/DRESSINGS) IMPLANT
BINDER BREAST XLRG (GAUZE/BANDAGES/DRESSINGS) IMPLANT
BINDER BREAST XXLRG (GAUZE/BANDAGES/DRESSINGS) ×1 IMPLANT
BLADE SURG 10 STRL SS (BLADE) ×8 IMPLANT
BLADE SURG 15 STRL LF DISP TIS (BLADE) IMPLANT
BLADE SURG 15 STRL SS (BLADE)
BNDG GAUZE ELAST 4 BULKY (GAUZE/BANDAGES/DRESSINGS) ×4 IMPLANT
CANISTER SUCT 1200ML W/VALVE (MISCELLANEOUS) ×2 IMPLANT
CHLORAPREP W/TINT 26ML (MISCELLANEOUS) ×3 IMPLANT
CLIP APPLIE 9.375 MED OPEN (MISCELLANEOUS) IMPLANT
COVER BACK TABLE 60X90IN (DRAPES) ×2 IMPLANT
COVER MAYO STAND STRL (DRAPES) ×2 IMPLANT
DECANTER SPIKE VIAL GLASS SM (MISCELLANEOUS) IMPLANT
DRAIN CHANNEL 15F RND FF W/TCR (WOUND CARE) ×2 IMPLANT
DRAIN CHANNEL 19F RND (DRAIN) IMPLANT
DRAPE TOP ARMCOVERS (MISCELLANEOUS) ×2 IMPLANT
DRAPE U-SHAPE 76X120 STRL (DRAPES) ×2 IMPLANT
DRSG PAD ABDOMINAL 8X10 ST (GAUZE/BANDAGES/DRESSINGS) ×6 IMPLANT
DRSG TEGADERM 2-3/8X2-3/4 SM (GAUZE/BANDAGES/DRESSINGS) IMPLANT
ELECT BLADE 4.0 EZ CLEAN MEGAD (MISCELLANEOUS)
ELECT COATED BLADE 2.86 ST (ELECTRODE) ×2 IMPLANT
ELECT REM PT RETURN 9FT ADLT (ELECTROSURGICAL) ×2
ELECTRODE BLDE 4.0 EZ CLN MEGD (MISCELLANEOUS) IMPLANT
ELECTRODE REM PT RTRN 9FT ADLT (ELECTROSURGICAL) ×1 IMPLANT
EVACUATOR SILICONE 100CC (DRAIN) ×2 IMPLANT
GAUZE SPONGE 4X4 12PLY STRL (GAUZE/BANDAGES/DRESSINGS) IMPLANT
GLOVE BIO SURGEON STRL SZ 6 (GLOVE) ×4 IMPLANT
GOWN STRL REUS W/ TWL LRG LVL3 (GOWN DISPOSABLE) ×2 IMPLANT
GOWN STRL REUS W/TWL LRG LVL3 (GOWN DISPOSABLE) ×4
LIQUID BAND (GAUZE/BANDAGES/DRESSINGS) ×3 IMPLANT
NEEDLE HYPO 25X1 1.5 SAFETY (NEEDLE) ×2 IMPLANT
NS IRRIG 1000ML POUR BTL (IV SOLUTION) ×2 IMPLANT
PACK BASIN DAY SURGERY FS (CUSTOM PROCEDURE TRAY) ×2 IMPLANT
PENCIL BUTTON HOLSTER BLD 10FT (ELECTRODE) ×2 IMPLANT
PIN SAFETY STERILE (MISCELLANEOUS) ×2 IMPLANT
SHEET MEDIUM DRAPE 40X70 STRL (DRAPES) ×4 IMPLANT
SLEEVE SCD COMPRESS KNEE MED (MISCELLANEOUS) ×2 IMPLANT
SPONGE GAUZE 4X4 12PLY STER LF (GAUZE/BANDAGES/DRESSINGS) IMPLANT
SPONGE LAP 18X18 X RAY DECT (DISPOSABLE) ×8 IMPLANT
STAPLER VISISTAT 35W (STAPLE) ×4 IMPLANT
STRIP CLOSURE SKIN 1/2X4 (GAUZE/BANDAGES/DRESSINGS) IMPLANT
SUT ETHILON 2 0 FS 18 (SUTURE) ×4 IMPLANT
SUT MNCRL AB 4-0 PS2 18 (SUTURE) ×8 IMPLANT
SUT VIC AB 3-0 PS1 18 (SUTURE) ×16
SUT VIC AB 3-0 PS1 18XBRD (SUTURE) ×4 IMPLANT
SUT VIC AB 3-0 SH 27 (SUTURE)
SUT VIC AB 3-0 SH 27X BRD (SUTURE) IMPLANT
SUT VICRYL 0 CT-2 (SUTURE) ×2 IMPLANT
SUT VICRYL 4-0 PS2 18IN ABS (SUTURE) ×4 IMPLANT
SYR BULB IRRIGATION 50ML (SYRINGE) ×2 IMPLANT
SYR CONTROL 10ML LL (SYRINGE) ×1 IMPLANT
TAPE MEASURE VINYL STERILE (MISCELLANEOUS) ×2 IMPLANT
TOWEL OR 17X24 6PK STRL BLUE (TOWEL DISPOSABLE) ×4 IMPLANT
TUBE CONNECTING 20X1/4 (TUBING) ×2 IMPLANT
UNDERPAD 30X30 (UNDERPADS AND DIAPERS) ×4 IMPLANT
YANKAUER SUCT BULB TIP NO VENT (SUCTIONS) ×2 IMPLANT

## 2016-08-23 NOTE — Op Note (Signed)
Operative Note   DATE OF OPERATION: 2.13.18  LOCATION: [facility]-outpatient  SURGICAL DIVISION: Plastic Surgery  PREOPERATIVE DIAGNOSES:  1. Left breast cancer UIQ 2. Macromastia 3. Chronic neck and back paine  POSTOPERATIVE DIAGNOSES:  same  PROCEDURE:  Oncoplastic breast reduction left, right breast reduction for symmetry  SURGEON: Irene Limbo MD MBA  ASSISTANT: none   ANESTHESIA:  General.   EBL: 50 ml  COMPLICATIONS: None immediate.   INDICATIONS FOR PROCEDURE:  The patient, Felicia Hampton, is a 61 y.o. female born on 1956/02/05, is here for breast reduction following left lumpectomy in setting of macromastia.   FINDINGS: Right reduction 237 g Left reduction 162g  DESCRIPTION OF PROCEDURE:  The patient was marked standing in the preoperative area to mark sternal notch, chest midline, anterior axillary lines, inframammary folds. The location of new nipple areolar complex was marked at level of on iframammary fold on anterior surface breast by palpation. This was marked symmetric over bilateral breasts. With aid of Wise pattern marker, location of new nipple areolar complex and vertical limbs (7 cm) were marked. The patient was taken to the operating room. SCDs were placed and IV antibiotics were given. The patient's operative site was prepped and draped in a sterile fashion. A time out was performed and all information was confirmed to be correct.   Over left breast, inferior pedicle marked and nipple areolar complex marked with 45 mm diameter marker. Prior lumpectomy incision was made within location for inset of NAC and this was removed with excision of skin. Pedicle deepithlialized and developed to chest wall. Medial and lateral flaps developed. During this dissection encountered left axillary lymph node cavity and seroma drained. Lumpectomy cavity noted at 12 0 clock position. Clips placed to mark lumpectomy cavity. Medial and lateral triangles of breast tissue  excised. Breast tailor tacked closed.  I then directed attention to right breast where superomedial pedicle deepithelialized and developed to chest wall. Medial and lateral soft tissue flaps developed and inferior breast tissue excised. Patient brought to upright sitting position and assessed for symmetry. Patent returned to supine position and breast cavities irrigated and hemostasis obtained. 15 Fr JP placed in each cavity and secured with 2-0 nylon. Closure completed with 3-0 vicryl to approximate dermis along inframammary fold and vertical limbs. NAC inset with 4-0 vicryl in dermis. Skin closure completed with 4-0 monocryl subcuticular throughout.Tissue adhesive applied.  Dry dressing and breast binder applied. The patient was allowed to wake from anesthesia, extubated and taken to the recovery room in satisfactory condition.  The patient was allowed to wake from anesthesia, extubated and taken to the recovery room in satisfactory condition.   SPECIMENS: right and left breast reduction  DRAINS: 15 Fr JP in right and left breast  Irene Limbo, MD [facility] Plastic & Reconstructive Surgery [phone], pin [phone]

## 2016-08-23 NOTE — Discharge Instructions (Signed)
°  Post Anesthesia Home Care Instructions  Activity: Get plenty of rest for the remainder of the day. A responsible adult should stay with you for 24 hours following the procedure.  For the next 24 hours, DO NOT: -Drive a car -Operate machinery -Drink alcoholic beverages -Take any medication unless instructed by your physician -Make any legal decisions or sign important papers.  Meals: Start with liquid foods such as gelatin or soup. Progress to regular foods as tolerated. Avoid greasy, spicy, heavy foods. If nausea and/or vomiting occur, drink only clear liquids until the nausea and/or vomiting subsides. Call your physician if vomiting continues.  Special Instructions/Symptoms: Your throat may feel dry or sore from the anesthesia or the breathing tube placed in your throat during surgery. If this causes discomfort, gargle with warm salt water. The discomfort should disappear within 24 hours.  If you had a scopolamine patch placed behind your ear for the management of post- operative nausea and/or vomiting:  1. The medication in the patch is effective for 72 hours, after which it should be removed.  Wrap patch in a tissue and discard in the trash. Wash hands thoroughly with soap and water. 2. You may remove the patch earlier than 72 hours if you experience unpleasant side effects which may include dry mouth, dizziness or visual disturbances. 3. Avoid touching the patch. Wash your hands with soap and water after contact with the patch.   Information for Discharge Teaching: EXPAREL (bupivacaine liposome injectable suspension)   Your surgeon gave you EXPAREL(bupivacaine) in your surgical incision to help control your pain after surgery.   EXPAREL is a local anesthetic that provides pain relief by numbing the tissue around the surgical site.  EXPAREL is designed to release pain medication over time and can control pain for up to 72 hours.  Depending on how you respond to EXPAREL, you may  require less pain medication during your recovery.  Possible side effects:  Temporary loss of sensation or ability to move in the area where bupivacaine was injected.  Nausea, vomiting, constipation  Rarely, numbness and tingling in your mouth or lips, lightheadedness, or anxiety may occur.  Call your doctor right away if you think you may be experiencing any of these sensations, or if you have other questions regarding possible side effects.  Follow all other discharge instructions given to you by your surgeon or nurse. Eat a healthy diet and drink plenty of water or other fluids.  If you return to the hospital for any reason within 96 hours following the administration of EXPAREL, please inform your health care providers. 

## 2016-08-23 NOTE — H&P (Signed)
Subjective:     Patient ID: Felicia Hampton is a 61 y.o. female.  HPI  Referred by Dr. Donne Hazel for consultation for breast reconstruction. Presented following screening MMG with distortion left breast. No [place] correlate. MRI with  8 x 6 x 5 mm irregular enhancing mass-like area in the central, upper left breast and additional 6.0 x 3.7 x 1.9 cm area of NME in UOQ left breast. Biopsy showed UIQ lesion to be IDC, ER/PR+, Her2- and UOQ as ALH. She is now post lumpectomy with 0.5 cm IDC with ALH, 0/1 negative margins.  Genetics negative.  Current 40 DDD. Note several year history neck and back pain she relates to breast size. This has failed specialty fitting for bras, OTC pain medication (notes she had hallucinations with prescription pain medication), and PT. Denies rashes, denies numbness hands.   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 She is currently still on leave from 03/2016 right rotator cuff surgery.       Objective:   Physical Exam  Constitutional: She is oriented to person, place, and time.  Cardiovascular: Normal rate.  normal heart sounds Pulmonary/Chest: Effort normal. Clear to auscultation Neurological: She is alert and oriented to person, place, and time.  Skin:  Fitzpatrick 2   L>R volume- per patient this may be due to edema as she has not noted significant asymmetry    Sn to nipple R 29 L 30 m BW R 22 L 22 cm Nipple to IMF R 10 L 11 cm Assessment:     Left breast ca UIQ and left breast UOQ [facility]    Plan:     Plan oncoplastic reconstruction as staged procedure with reduction 7-10 d post lumpectomy to ensure pathologic clearance.Reviewed reduction with anchor type scars, drains, post operative visits and limitations, recovery. Diminished sensation nipple and breast skin, risk of nipple loss, wound healing problems, asymmetry. Discussedsmaller breast size may aid with adjuvant radiation. Discussed  will have some contraction of breast volume and increased firmness with radiation, less ptosis with aging. Discussed changes with wt gain, loss, aging. If she pursues partial mastectomy, highly encourage her to pursue breast reduction prior to XRT as risk of complications post would be very high.   Reviewed post procedure limitations, off work.   Irene Limbo, MD [facility] Plastic & Reconstructive Surgery [phone], pin [phone]

## 2016-08-23 NOTE — Anesthesia Postprocedure Evaluation (Signed)
Anesthesia Post Note  Patient: Felicia Hampton  Procedure(s) Performed: Procedure(s) (LRB): BILATERAL ONCOPLASTIC BREAST REDUCTION (Bilateral)  Patient location during evaluation: PACU Anesthesia Type: General Level of consciousness: awake and alert Pain management: pain level controlled Vital Signs Assessment: post-procedure vital signs reviewed and stable Respiratory status: spontaneous breathing, nonlabored ventilation, respiratory function stable and patient connected to nasal cannula oxygen Cardiovascular status: blood pressure returned to baseline and stable Postop Assessment: no signs of nausea or vomiting Anesthetic complications: no       Last Vitals:  Vitals:   08/23/16 1100 08/23/16 1150  BP: 124/68 132/77  Pulse: 78 70  Resp: 19 18  Temp:  36.6 C    Last Pain:  Vitals:   08/23/16 1150  TempSrc:   PainSc: 0-No pain                 Catalina Gravel

## 2016-08-23 NOTE — Transfer of Care (Signed)
Immediate Anesthesia Transfer of Care Note  Patient: Felicia Hampton  Procedure(s) Performed: Procedure(s): BILATERAL ONCOPLASTIC BREAST REDUCTION (Bilateral)  Patient Location: PACU  Anesthesia Type:General  Level of Consciousness: awake and alert   Airway & Oxygen Therapy: Patient Spontanous Breathing and Patient connected to face mask oxygen  Post-op Assessment: Report given to RN and Post -op Vital signs reviewed and stable  Post vital signs: Reviewed and stable  Last Vitals:  Vitals:   08/23/16 0640 08/23/16 1041  BP: 118/73 (P) 136/66  Pulse: 73   Resp: 18 (P) 14  Temp: 36.5 C (P) 36.6 C    Last Pain:  Vitals:   08/23/16 0640  TempSrc: Oral      Patients Stated Pain Goal: 0 ([id number])  Complications: No apparent anesthesia complications

## 2016-08-23 NOTE — Anesthesia Procedure Notes (Signed)
Procedure Name: Intubation Date/Time: 08/23/2016 7:32 AM Performed by: Lieutenant Diego Pre-anesthesia Checklist: Patient identified, Emergency Drugs available, Suction available and Patient being monitored Patient Re-evaluated:Patient Re-evaluated prior to inductionOxygen Delivery Method: Circle system utilized Preoxygenation: Pre-oxygenation with 100% oxygen Intubation Type: IV induction Ventilation: Mask ventilation without difficulty Laryngoscope Size: Miller and 2 Grade View: Grade I Tube type: Oral Tube size: 7.0 mm Number of attempts: 1 Airway Equipment and Method: Stylet and Oral airway Placement Confirmation: ETT inserted through vocal cords under direct vision,  positive ETCO2 and breath sounds checked- equal and bilateral Secured at: 21 cm Tube secured with: Tape Dental Injury: Teeth and Oropharynx as per pre-operative assessment

## 2016-08-24 ENCOUNTER — Encounter (HOSPITAL_BASED_OUTPATIENT_CLINIC_OR_DEPARTMENT_OTHER): Payer: Self-pay | Admitting: Plastic Surgery

## 2016-09-16 NOTE — Progress Notes (Signed)
Location of Breast Cancer: left breast upper inner quadrant   Histology per Pathology Report:   08/23/16 Diagnosis 1. Breast, Mammoplasty, Left - FIBROCYSTIC CHANGES WITH USUAL DUCTAL HYPERPLASIA. - FOCAL ATYPICAL LOBULAR HYPERPLASIA. - INFLAMMATION AND GIANT CELL REACTION CONSISTENT WITH PREVIOUS LUMPECTOMY. - NO INVASIVE CARICNOMA. 2. Breast, Mammoplasty, Right - FIBROCYSTIC CHANGES. - NO EVIDENCE OF MALIGNANCY.  08/11/16 Diagnosis 1. Breast, lumpectomy, Left - INVASIVE GRADE I DUCTAL CARCINOMA, SPANNING 0.5 CM IN GREATEST DIMENSION. - ATYPICAL LOBULAR HYPERPLASIA. - MARGINS ARE NEGATIVE. - SEE ONCOLOGY TEMPLATE. 2. Breast, lumpectomy, Left - ATYPICAL COLUMNAR CELL LESION WITH FLAT EPITHELIAL ATYPIA AND ATYPICAL DUCTAL HYPERPLASIA. - FIBROCYSTIC CHANGES WITH USUAL DUCTAL HYPERPLASIA. - CALCIFICATIONS PRESENT. - SEE COMMENT. 3. Lymph node, sentinel, biopsy, Left axillary - ONE BENIGN LYMPH NODE WITH NO TUMOR SEEN (0/1). 4. Breast, excision - BENIGN BREAST PARENCHYMA AND BENIGN FIBROFATTY SOFT TISSUE. - NO ATYPIA OR TUMOR SEEN. 5. Breast, excision, Left medial margin - BENIGN BREAST PARENCHYMA SHOWING FIBROCYSTIC CHANGES. - BENIGN FIBROFATTY SOFT TISSUE. - NO ATYPIA OR TUMOR SEEN.  08/09/16 Diagnosis Breast, left, needle core biopsy, upper outer quadrant - anterior - FIBROCYSTIC CHANGES WITH CALCIFICATIONS. - USUAL DUCTAL HYPERPLASIA. - THERE IS NO EVIDENCE OF MALIGNANCY. - SEE COMMENT.  07/12/16 Diagnosis 1. Breast, left, needle core biopsy, UIQ - INVASIVE DUCTAL CARCINOMA - LOBULAR NEOPLASIA (ATYPICAL LOBULAR HYPERPLASIA) - COMPLEX SCLEROSING LESION. - SEE COMMENT. 2. Breast, left, needle core biopsy, UOQ - LOBULAR NEOPLASIA (ATYPICAL LOBULAR HYPERPLASIA). - SEE COMMENT.  Receptor Status: ER(100%), PR (100%), Her2-neu (negative), Ki-(2%)  Did patient present with symptoms (if so, please note symptoms) or was this found on screening mammography?: screening  mammogram  Past/Anticipated interventions by surgeon, if any: 08/23/16 - Procedure: BILATERAL ONCOPLASTIC BREAST REDUCTION;  Surgeon: Irene Limbo, MD, 08/11/16 - Procedure: RADIOACTIVE SEED GUIDED LEFT BREAST LUMPECTOMY WITH AXILLARY SENTINEL LYMPH NODE BIOPSY;  Surgeon: Rolm Bookbinder, MD  Past/Anticipated interventions by medical oncology, if any: no  Lymphedema issues, if any:  Swelling on the side of her left breast   Pain issues, if any:  no   GYNECOLOGIC HISTORY: Patient is postmenopausal.  Menarche age 75, first live birth age 44. The patient is GX P1. She went through menopause in her early 69s and was on hormone replacement for approximately 10 years, stopping approximately 2016.  SAFETY ISSUES:  Prior radiation? no  Pacemaker/ICD? no  Possible current pregnancy?no  Is the patient on methotrexate? no  Current Complaints / other details:  Patient is going on a trip to [place] on 09/29/16 for a week.  BP 111/62 (BP Location: Right Arm, Patient Position: Sitting)   Pulse 72   Temp 98.3 F (36.8 C) (Oral)   Ht 5' 2"  (1.575 m)   Wt 182 lb 3.2 oz (82.6 kg)   SpO2 99%   BMI 33.32 kg/m    Wt Readings from Last 3 Encounters:  09/19/16 182 lb 3.2 oz (82.6 kg)  08/23/16 180 lb (81.6 kg)  08/11/16 178 lb (80.7 kg)      Jacqulyn Liner, RN 09/16/2016,8:28 AM

## 2016-09-19 ENCOUNTER — Ambulatory Visit
Admission: RE | Admit: 2016-09-19 | Discharge: 2016-09-19 | Disposition: A | Discharge status: Home / Self Care | Payer: Managed Care, Other (non HMO) | Source: Ambulatory Visit | Attending: Radiation Oncology | Admitting: Radiation Oncology

## 2016-09-19 ENCOUNTER — Encounter: Payer: Self-pay | Admitting: Radiation Oncology

## 2016-09-19 VITALS — BP 111/62 | HR 72 | Temp 98.3°F | Ht 62.0 in | Wt 182.2 lb

## 2016-09-19 DIAGNOSIS — Z8041 Family history of malignant neoplasm of ovary: Secondary | ICD-10-CM | POA: Insufficient documentation

## 2016-09-19 DIAGNOSIS — Z8042 Family history of malignant neoplasm of prostate: Secondary | ICD-10-CM | POA: Insufficient documentation

## 2016-09-19 DIAGNOSIS — Z510 Encounter for antineoplastic radiation therapy: Secondary | ICD-10-CM | POA: Diagnosis not present

## 2016-09-19 DIAGNOSIS — Z885 Allergy status to narcotic agent status: Secondary | ICD-10-CM | POA: Diagnosis not present

## 2016-09-19 DIAGNOSIS — Z800 Family history of malignant neoplasm of digestive organs: Secondary | ICD-10-CM | POA: Diagnosis not present

## 2016-09-19 DIAGNOSIS — Z170 Estrogen receptor positive status [ER+]: Secondary | ICD-10-CM | POA: Insufficient documentation

## 2016-09-19 DIAGNOSIS — Z79899 Other long term (current) drug therapy: Secondary | ICD-10-CM | POA: Diagnosis not present

## 2016-09-19 DIAGNOSIS — F909 Attention-deficit hyperactivity disorder, unspecified type: Secondary | ICD-10-CM | POA: Insufficient documentation

## 2016-09-19 DIAGNOSIS — C50212 Malignant neoplasm of upper-inner quadrant of left female breast: Secondary | ICD-10-CM | POA: Diagnosis not present

## 2016-09-19 DIAGNOSIS — E7800 Pure hypercholesterolemia, unspecified: Secondary | ICD-10-CM | POA: Insufficient documentation

## 2016-09-19 DIAGNOSIS — Z803 Family history of malignant neoplasm of breast: Secondary | ICD-10-CM | POA: Diagnosis not present

## 2016-09-19 DIAGNOSIS — Z888 Allergy status to other drugs, medicaments and biological substances status: Secondary | ICD-10-CM | POA: Diagnosis not present

## 2016-09-19 DIAGNOSIS — Z87891 Personal history of nicotine dependence: Secondary | ICD-10-CM | POA: Insufficient documentation

## 2016-09-19 NOTE — Progress Notes (Signed)
Radiation Oncology         [phone] ________________________________  Initial Outpatient Consultation  Name: Felicia Hampton MRN: [id number]  Date: 09/19/2016  DOB: 02-01-1956  CC:Pcp Not In System  Magrinat, Virgie Dad, MD   REFERRING PHYSICIAN: Magrinat, Virgie Dad, MD  DIAGNOSIS: Stage IA (pT1a, pN0) grade 1 invasive ductal carcinoma of the left breast (ER+, PR+, HER2-)  HISTORY OF PRESENT ILLNESS::Felicia Hampton is a 61 y.o. female who had a screening mammogram on 05/31/16 at [facility] that showed an area of distortion in the superior left breast. Diagnostic left breast mammogram and ultrasound were performed on 06/15/16 at [facility]. The mammogram showed a persistent distortion in the upper central left breast posterior depth. There was no sonographic correlation. MRI of the bilateral breasts on 07/06/16 showed a 0.8 x 0.6 x 0.5 cm irregular mass-like enhancement in the central upper left breast and a 6.0 x 3.7 x 1.9 cm area of non-mass enhancement in the upper outer quadrant of the left breast that is 3 cm lateral to the enhancing mass-like area. No abnormal lymph nodes were noted. The patient underwent biopsies of the left breast on 07/12/16. Biopsy of the UIQ left breast revealed grade 1 IDC, lobular neoplasia, and a complex sclerosing lesion (ER 100%+, PR100%+, HER2-, Ki67 2%). Biopsy of the UOQ left breast revealed lobular neoplasia. Biopsy of the UOQ anterior left breast revealed fibrocystic changes with calcifications, usual ductal hyperplasia, and no evidence of malignancy. The patient underwent a lumpectomy and sentinel lymph node biopsy on 08/11/16. Left breast lumpectomy revealed grade 1 IDC spanning 0.5 cm, atypical lobular hyperplasia, and negative margins. Left breast lumpectomy also revealed atypical columnar cell lesion with flat epithelial atypia and atypical ductal hyperplasia, fibrocystic changes with usual ductal hyperplasia, and calcifications. Biopsy of a left axillary SLN  was negative. Repeat US of the left axilla on 07/18/16 revealed no abnormal lymph nodes. The patient underwent a bilateral breast mammoplasty on 08/23/16 by Dr. Iran Planas. Left mammoplasty revealed fibrocystic changes with usual ductal hyerplasia, focal atypical lobular hyperplasia, inflammation and giant cell reaction consistent with her previous lumpectomy, and no invasive carcinoma. Right breast mammoplasty revealed fibrocystic changes and no evidence of malignancy. The patient presents today to discuss adjiuvant radiation to the left breast.  PREVIOUS RADIATION THERAPY: No  PAST MEDICAL HISTORY:  has a past medical history of Adult ADHD; Cancer ([place]) (2018); Family history of breast cancer; Family history of colon cancer; Family history of ovarian cancer; Genetic testing (08/02/2016); Hypercholesteremia; and PONV (postoperative nausea and vomiting).    PAST SURGICAL HISTORY: Past Surgical History:  Procedure Laterality Date  . ABDOMINAL HYSTERECTOMY    . BREAST REDUCTION SURGERY Bilateral 08/23/2016   Procedure: BILATERAL ONCOPLASTIC BREAST REDUCTION;  Surgeon: Irene Limbo, MD;  Location: [place];  Service: Plastics;  Laterality: Bilateral;  . KNEE ARTHROSCOPY     x3  . RADIOACTIVE SEED GUIDED MASTECTOMY WITH AXILLARY SENTINEL LYMPH NODE BIOPSY Left 08/11/2016   Procedure: RADIOACTIVE SEED GUIDED LEFT BREAST LUMPECTOMY WITH AXILLARY SENTINEL LYMPH NODE BIOPSY;  Surgeon: Rolm Bookbinder, MD;  Location: [place];  Service: General;  Laterality: Left;  . SHOULDER ARTHROSCOPY W/ ROTATOR CUFF REPAIR Right     FAMILY HISTORY: family history includes Breast cancer (age of onset: 60) in her cousin; Colon cancer (age of onset: 94) in her sister; Colon cancer (age of onset: 66) in her paternal grandmother; Melanoma in her brother; Ovarian cancer in her maternal aunt; Prostate cancer  in her father.  SOCIAL HISTORY:  reports that she quit smoking about 28 years ago.  She has a 5.00 pack-year smoking history. She has never used smokeless tobacco. She reports that she drinks alcohol. She reports that she does not use drugs.  ALLERGIES: Ace inhibitors; Diclofenac; Morphine; Morphine and related; and Oxycodone  MEDICATIONS:  Current Outpatient Prescriptions  Medication Sig Dispense Refill  . acetaminophen (TYLENOL) 325 MG tablet Take 650 mg by mouth every 6 (six) hours as needed.    Marland Kitchen atorvastatin (LIPITOR) 20 MG tablet Take 20 mg by mouth daily at 6 PM.    . Cholecalciferol (VITAMIN D) 2000 units tablet Take by mouth.    . fluticasone (FLONASE) 50 MCG/ACT nasal spray Place into the nose.    . Loratadine 10 MG CAPS Take by mouth.    . Multiple Vitamin (MULTIVITAMIN) capsule Take by mouth.    . traMADol (ULTRAM) 50 MG tablet Take 1-2 tablets (50-100 mg total) by mouth every 6 (six) hours as needed. 30 tablet 0  . zolpidem (AMBIEN) 10 MG tablet Take 10 mg by mouth at bedtime as needed for sleep.    Marland Kitchen amphetamine-dextroamphetamine (ADDERALL) 10 MG tablet Take 10 mg by mouth 3 (three) times a week.     No current facility-administered medications for this encounter.     REVIEW OF SYSTEMS:  On review of systems, the patient reports that she is doing well overall. She denies any chest pain, shortness of breath, cough, fevers, chills, night sweats, unintended weight changes. She denies any bowel or bladder disturbances, and denies abdominal pain, nausea or vomiting. She denies any new musculoskeletal or joint aches or pains, new skin lesions or concerns. Her only complaint is swelling of the left breast. A complete review of systems is obtained and is otherwise negative.  Pertinent items noted in HPI and remainder of comprehensive ROS otherwise negative.   PHYSICAL EXAM:  height is 5' 2"  (1.575 m) and weight is 182 lb 3.2 oz (82.6 kg). Her oral temperature is 98.3 F (36.8 C). Her blood pressure is 111/62 and her pulse is 72. Her oxygen saturation is 99%.   General:  Alert and oriented, in no acute distress HEENT: Head is normocephalic. Extraocular movements are intact. Oropharynx is clear. Neck: Neck is supple, no palpable cervical or supraclavicular lymphadenopathy. Heart: Regular in rate and rhythm with no murmurs, rubs, or gallops. Chest: Clear to auscultation bilaterally, with no rhonchi, wheezes, or rales. Abdomen: Soft, nontender, nondistended, with no rigidity or guarding. Extremities: No cyanosis or edema. Lymphatics: see Neck Exam. No axillary lymphadenopathy. Skin: No concerning lesions. Musculoskeletal: symmetric strength and muscle tone throughout. Neurologic: Cranial nerves II through XII are grossly intact. No obvious focalities. Speech is fluent. Coordination is intact. Psychiatric: Judgment and insight are intact. Affect is appropriate. Breast: Scars from reconstruction in the inferior aspect of the right breast and areolar area, no sign of infection or drainage. Scars from reconstruction in the inferior aspect of the left breast and areolar area, approximately 1 cm along the inferior aspect of her scar has no completely healed yet. Left axillary sentinel node scar well-healed without signs of drainage or infection  ECOG = 1  LABORATORY DATA:  Lab Results  Component Value Date   WBC 7.2 07/26/2016   HGB 13.4 07/26/2016   HCT 38.9 07/26/2016   MCV 83.5 07/26/2016   PLT 301 07/26/2016   NEUTROABS 4.4 07/26/2016   Lab Results  Component Value Date   NA 139 07/26/2016  K 4.1 07/26/2016   CO2 26 07/26/2016   GLUCOSE 104 07/26/2016   CREATININE 0.7 07/26/2016   CALCIUM 10.0 07/26/2016      RADIOGRAPHY: No results found.    IMPRESSION: Stage IA (pT1a, pN0) grade 1 invasive ductal carcinoma of the left breast (ER+, PR+, HER2-)  The patient has completed breast conservation surgery and would be a good candidate for radiation as part of her breast conservation therapy. I spoke to the patient today regarding her diagnosis and  options for treatment. We discussed the equivalence in terms of survival and local failure between mastectomy and breast conservation. We discussed the role of radiation in decreasing local failures in patients who undergo lumpectomy. We discussed the process of CT simulation and the placement tattoos. We discussed 5 1/2 weeks of treatment as an outpatient. We discussed the possibility of asymptomatic lung damage. We discussed the low likelihood of secondary malignancies. We discussed the possible side effects including but not limited to skin redness, fatigue, permanent skin darkening, and breast swelling.  We discussed the use of cardiac sparing with deep inspiration breath hold if needed.  The patient does not require a boost since she had negative margins from her lumpectomy surgery and her mammoplasty removed her lumpectomy site and this was clear as well.  PLAN: The patient is going on a trip to [place] on 09/29/16 for a week. Therefore, we scheduled CT simulation on 09/27/16 at [facility] and will begin treatment the week after she returns from her trip. This should also give her enough time to heal from her recent surgeries. The patient is scheduled to consult with Dr. Jana Hakim of medical oncology on 09/20/16.  I spent 60 minutes minutes face to face with the patient and more than 50% of that time was spent in counseling and/or coordination of care.   ------------------------------------------------  Blair Promise, PhD, MD  This document serves as a record of services personally performed by Gery Pray, MD. It was created on his behalf by Darcus Austin, a trained medical scribe. The creation of this record is based on the scribe's personal observations and the provider's statements to them. This document has been checked and approved by the attending provider.

## 2016-09-20 ENCOUNTER — Ambulatory Visit (HOSPITAL_BASED_OUTPATIENT_CLINIC_OR_DEPARTMENT_OTHER): Payer: Managed Care, Other (non HMO) | Admitting: Oncology

## 2016-09-20 VITALS — BP 108/73 | HR 87 | Temp 97.8°F | Resp 18 | Ht 62.0 in | Wt 180.9 lb

## 2016-09-20 DIAGNOSIS — C50212 Malignant neoplasm of upper-inner quadrant of left female breast: Secondary | ICD-10-CM | POA: Diagnosis not present

## 2016-09-20 DIAGNOSIS — Z170 Estrogen receptor positive status [ER+]: Secondary | ICD-10-CM | POA: Diagnosis not present

## 2016-09-20 MED ORDER — TAMOXIFEN CITRATE 20 MG PO TABS: 20.0000 mg | ORAL_TABLET | Freq: Every day | ORAL | 12 refills | Status: DC

## 2016-09-20 NOTE — Progress Notes (Signed)
[place]  Telephone:(336) [phone] Fax:(336) [phone]     ID: Felicia Hampton DOB: 11-10-1955  MR#: [id number]  ELF#:[id number]  Patient Care Team: Pcp Not In System as PCP - General Chauncey Cruel, MD as Consulting Physician (Oncology) Rolm Bookbinder, MD as Consulting Physician (General Surgery) Viona Gilmore Evette Cristal, MD as Consulting Physician (Obstetrics and Gynecology) Irene Limbo, MD as Consulting Physician (Plastic Surgery) Chauncey Cruel, MD OTHER MD:  CHIEF COMPLAINT: Estrogen receptor positive breast cancer  CURRENT TREATMENT: Awaiting adjuvant radiation   BREAST CANCER HISTORY: From the original consult note:  Felicia Hampton had routine screening mammography at Dr. Verlon Au suggesting an abnormality in her left breast upper inner quadrant. Breast density was category B. She was referred for ultrasonography which did not identify the mass. It also found the axilla to be clear sonographically. Breast MRI 07/01/2016 found no abnormalities in the right breast. In the left breast central upper section there was a 0.8 cm enhancing mass associated with architectural distortion. There was a 6 cm area of non-masslike enhancement in the upper outer quadrant of the left breast 3 cm lateral to the other lesion. There were no abnormal appearing lymph nodes.  On 07/12/2016 she underwent MRI guided core biopsies of these 2 areas in question. The final pathology ([id number]) showed in the upper inner quadrant, invasive ductal carcinoma, grade 1, estrogen receptor 100% positive, progesterone receptor 100% positive, both with strong staining intensity, with an MIB-1 of 2%, and no HER-2 amplification, the signals ratio being 1.89 and the number per cell 2.65. In the upper outer quadrant biopsy there was only atypical lobular hyperplasia.  The patient's subsequent history is as detailed below  INTERVAL HISTORY: Felicia Hampton was evaluated in the breast clinic 09/20/2016 accompanied by her husband  Felicia Hampton. Since her last visit here she underwent left lumpectomy and sentinel lymph node sampling, the day being 08/11/2016, and the final pathology ([id number]) showing a 0.5 cm invasive ductal carcinoma, grade 1, with negative margins, and again HER-2 negative, with a signals ratio of 1.72 and the number per cell 2.03. The single sentinel lymph node was benign.  Given the size of the tumor chemotherapy was not indicated an Oncotype was not sent. The patient has been referred to radiation for adjuvant treatment. However she has a conference coming up in [place] and she will not start her radiation treatments until the end of this month.  She is here to start a discussion of anti-estrogens.  REVIEW OF SYSTEMS: She did well with her surgery, with no complications, and proceeded to bilateral mammoplasties 08/23/2016. The pathology from this procedure, which incidentally included the lumpectomy site, showed no evidence of cancer ([id number]). She has had a minimal area of dehiscence in the inferior aspect of her left breast. That is "clearing up". Otherwise she has occasional muscle aches but remains extremely active physically and mentally. A detailed review of systems today was noncontributory except as noted  PAST MEDICAL HISTORY: Past Medical History:  Diagnosis Date  . Adult ADHD   . Cancer [facility]) 2018   left breast  . Family history of breast cancer   . Family history of colon cancer   . Family history of ovarian cancer   . Genetic testing 08/02/2016   Negative; No pathogenic mutations detected. STAT Breast panel with reflex to Common Cancers panel  Genes tested were the 43 genes on [facility]'s Common Cancers panel (APC, ATM, AXIN2, BARD1, BMPR1A, BRCA1, BRCA2, BRIP1, CDH1, CDKN2A, CHEK2, DICER1, EPCAM, GREM1,  HOXB13, KIT, MEN1, MLH1, MSH2, MSH6, MUTYH, NBN, NF1, PALB2, PDGFRA, PMS2, POLD1, POLE, PTEN, RAD50, RAD51C, RAD51D, SDHA, SDHB, SDHC, SDHD  . Hypercholesteremia   . PONV (postoperative  nausea and vomiting)     PAST SURGICAL HISTORY: Past Surgical History:  Procedure Laterality Date  . ABDOMINAL HYSTERECTOMY    . BREAST REDUCTION SURGERY Bilateral 08/23/2016   Procedure: BILATERAL ONCOPLASTIC BREAST REDUCTION;  Surgeon: Irene Limbo, MD;  Location: [place];  Service: Plastics;  Laterality: Bilateral;  . KNEE ARTHROSCOPY     x3  . RADIOACTIVE SEED GUIDED MASTECTOMY WITH AXILLARY SENTINEL LYMPH NODE BIOPSY Left 08/11/2016   Procedure: RADIOACTIVE SEED GUIDED LEFT BREAST LUMPECTOMY WITH AXILLARY SENTINEL LYMPH NODE BIOPSY;  Surgeon: Rolm Bookbinder, MD;  Location: [place];  Service: General;  Laterality: Left;  . SHOULDER ARTHROSCOPY W/ ROTATOR CUFF REPAIR Right     FAMILY HISTORY Family History  Problem Relation Age of Onset  . Colon cancer Sister 69    deceased 59  . Melanoma Brother     on back; currently 34  . Ovarian cancer Maternal Aunt   . Colon cancer Paternal Grandmother 75    deceased  . Breast cancer Cousin 37    mat female cousin; daughter of unaffected mat aunt; deceased 45  . Prostate cancer Father     deceased 50  The patient's father died from prostate cancer at age 75. The patient's mother died from heart disease at age 27. The patient had 5 brothers, 6 sisters. A twin sister died at the age of 16 from colon cancer. A maternal cousin had breast cancer at age 18. A maternal aunt had ovarian cancer, age at diagnosis unknown. Another maternal aunt had kidney cancer. On the father's side A grandmother died at age 66 with colon cancer  GYNECOLOGIC HISTORY:  No LMP recorded. Patient is postmenopausal. Menarche age 50, first live birth age 39. The patient is GX P1. She Underwent hysterectomy in her late 61s and was on hormone replacement for approximately 10 years, stopping approximately 2016  SOCIAL HISTORY:  Felicia Hampton has an interesting athletic history and she swam the [place] channel to [place] and also did other  long-term swims in her youth. She is originally from [place]. She works as an Scientist, research (life sciences) and tells me just doing her job she gets more than 10,000 steps a day. Her husband Felicia Hampton is retired from Insurance underwriter. He has children of his. The patient's son Felicia Hampton lives in [place] and is in Transport planner. The patient has 1 biological granddaughter, 11 months old as of January 2018.    ADVANCED DIRECTIVES: Not in place   HEALTH MAINTENANCE: Social History  Substance Use Topics  . Smoking status: Former Smoker    Packs/day: 0.50    Years: 10.00    Quit date: 08/02/1988  . Smokeless tobacco: Never Used  . Alcohol use Yes     Comment: social     Colonoscopy: July 2016  PAP: 2016  Bone density: 2015   Allergies  Allergen Reactions  . Ace Inhibitors Palpitations and Shortness Of Breath    COX-2 inhibitors  . Diclofenac     Other reaction(s): Hypotension (ALLERGY/intolerance)  . Morphine Nausea And Vomiting  . Morphine And Related Nausea And Vomiting  . Oxycodone Other (See Comments)    hallucinations    Current Outpatient Prescriptions  Medication Sig Dispense Refill  . acetaminophen (TYLENOL) 325 MG tablet Take 650 mg by mouth every 6 (six) hours  as needed.    Marland Kitchen amphetamine-dextroamphetamine (ADDERALL) 10 MG tablet Take 10 mg by mouth 3 (three) times a week.    Marland Kitchen atorvastatin (LIPITOR) 20 MG tablet Take 20 mg by mouth daily at 6 PM.    . Cholecalciferol (VITAMIN D) 2000 units tablet Take by mouth.    . fluticasone (FLONASE) 50 MCG/ACT nasal spray Place into the nose.    . Loratadine 10 MG CAPS Take by mouth.    . Multiple Vitamin (MULTIVITAMIN) capsule Take by mouth.    . traMADol (ULTRAM) 50 MG tablet Take 1-2 tablets (50-100 mg total) by mouth every 6 (six) hours as needed. 30 tablet 0  . zolpidem (AMBIEN) 10 MG tablet Take 10 mg by mouth at bedtime as needed for sleep.     No current facility-administered medications for this visit.     OBJECTIVE: Middle-aged white  woman In no acute distress  Vitals:   09/20/16 1523  BP: 108/73  Pulse: 87  Resp: 18  Temp: 97.8 F (36.6 C)     Body mass index is 33.09 kg/m.    ECOG FS:0 - Asymptomatic  Sclerae unicteric, EOMs intact Oropharynx clear and moist No cervical or supraclavicular adenopathy Lungs no rales or rhonchi Heart regular rate and rhythm Abd soft, nontender, positive bowel sounds MSK no focal spinal tenderness, no upper extremity lymphedema Neuro: nonfocal, well oriented, appropriate affect Breasts: Both breasts are status post reduction mammoplasty. There is excellent symmetry and a good cosmetic result. The left side in the inferior mammary fold at the junction of the 2 scars there is a minimal area of dehiscence, measuring approximately 2 cm by half a centimeter, and very shallow. There is a 1 cm area of slight dehiscence in the lateralmost aspect of this incision. This is also very shallow. Skin: Felicia Hampton has a history of multiple skin cancers including basal cells and squamous cells. She wanted to show me a lesion in her right lower leg. This is imaged below.    09/20/2016  Right lower leg lesions    LAB RESULTS:  CMP     Component Value Date/Time   NA 139 07/26/2016 1556   K 4.1 07/26/2016 1556   CO2 26 07/26/2016 1556   GLUCOSE 104 07/26/2016 1556   BUN 18.5 07/26/2016 1556   CREATININE 0.7 07/26/2016 1556   CALCIUM 10.0 07/26/2016 1556   PROT 7.1 07/26/2016 1556   ALBUMIN 4.0 07/26/2016 1556   AST 29 07/26/2016 1556   ALT 37 07/26/2016 1556   ALKPHOS 96 07/26/2016 1556   BILITOT 0.33 07/26/2016 1556    INo results found for: SPEP, UPEP  Lab Results  Component Value Date   WBC 7.2 07/26/2016   NEUTROABS 4.4 07/26/2016   HGB 13.4 07/26/2016   HCT 38.9 07/26/2016   MCV 83.5 07/26/2016   PLT 301 07/26/2016      Chemistry      Component Value Date/Time   NA 139 07/26/2016 1556   K 4.1 07/26/2016 1556   CO2 26 07/26/2016 1556   BUN 18.5 07/26/2016 1556    CREATININE 0.7 07/26/2016 1556      Component Value Date/Time   CALCIUM 10.0 07/26/2016 1556   ALKPHOS 96 07/26/2016 1556   AST 29 07/26/2016 1556   ALT 37 07/26/2016 1556   BILITOT 0.33 07/26/2016 1556       No results found for: LABCA2  No components found for: LABCA125  No results for input(s): INR in the last 168 hours.  Urinalysis No results found for: COLORURINE, APPEARANCEUR, LABSPEC, PHURINE, GLUCOSEU, HGBUR, BILIRUBINUR, KETONESUR, PROTEINUR, UROBILINOGEN, NITRITE, LEUKOCYTESUR   STUDIES: No results found.  ELIGIBLE FOR AVAILABLE RESEARCH PROTOCOL: no  ASSESSMENT: 61 y.o. [place] woman status post left breast upper inner quadrant biopsy 07/12/2016 for a clinical T1b N0, stage IA invasive ductal carcinoma, grade 1 estrogen and progesterone receptor strongly positive, HER-2 not amplified, with an MIB-1 of 2%  (a) biopsy of an area of non-masslike enhancement in the upper outer quadrant 07/12/2016 showed atypical lobular hyperplasia  (1) genetics testing through the [facility]'s STAT breast panel 07/21/2016 found no deleterious mutations in ATM, BRCA1, BRCA2, CDH1, CHEK2, PALB2, PTEN, STK11, TP53. There were also no deleterious mutations in the 43 genes on [facility]'s Common Cancers panel (APC, ATM, AXIN2, BARD1, BMPR1A, BRCA1, BRCA2, BRIP1, CDH1, CDKN2A, CHEK2, DICER1, EPCAM, GREM1, HOXB13, KIT, MEN1, MLH1, MSH2, MSH6, MUTYH, NBN, NF1, PALB2, PDGFRA, PMS2, POLD1, POLE, PTEN, RAD50, RAD51C, RAD51D, SDHA, SDHB, SDHC, SDHD, SMAD4, SMARCA4, STK11, TP53, TSC1, TSC2, VHL  (2)  Status post left lumpectomy and sentinel lymph node sampling 08/11/2016 for a pT1a pN0, stage IA  Invasive ductal carcinoma, grade 1, repeat HER-2 testing again negative  (a)   Pathology from bilateral mammoplastic surgery 08/23/2016 was benign (including lumpectomy site)  (3) given the l tumor size, low grade and low proliferation fraction, chemotherapy is not indicated  (4) adjuvant radiation is  pending  (5) to start tamoxifen 11/22/2016  PLAN: I spent approximately 30 minutes with Felicia Hampton going over her anti-estrogen choices. She understands that tumors as small and low-grade as hers have an excellent prognosis with local therapy alone. Nevertheless anti-estrogens will cut that very low risk of recurrence in half and also cutting half her risk of developing another breast cancer, which otherwise would be in the 1% per year range.   We discussed the difference between tamoxifen and anastrozole in detail. She understands that anastrozole and the aromatase inhibitors in general work by blocking estrogen production. Accordingly vaginal dryness, decrease in bone density, and of course hot flashes can result. The aromatase inhibitors can also negatively affect the cholesterol profile, although that is a minor effect. One out of 5 women on aromatase inhibitors we will feel "old and achy". This arthralgia/myalgia syndrome, which resembles fibromyalgia clinically, does resolve with stopping the medications. Accordingly this is not a reason to not try an aromatase inhibitor but it is a frequent reason to stop it (in other words 20% of women will not be able to tolerate these medications).  Tamoxifen on the other hand does not block estrogen production. It does not "take away a woman's estrogen". It blocks the estrogen receptor in breast cells. Like anastrozole, it can also cause hot flashes. As opposed to anastrozole, tamoxifen has many estrogen-like effects. It is technically an estrogen receptor modulator. This means that in some tissues tamoxifen works like estrogen-- for example it helps strengthen the bones. It tends to improve the cholesterol profile. It can cause thickening of the endometrial lining, and even endometrial polyps or rarely cancer of the uterus.(The risk of uterine cancer due to tamoxifen is one additional cancer per thousand women year). It can cause vaginal wetness or stickiness. It can  cause blood clots through this estrogen-like effect--the risk of blood clots with tamoxifen is exactly the same as with birth control pills or hormone replacement.  Neither of these agents causes mood changes or weight gain, despite the popular belief that they can have these side effects. We have data  from studies comparing either of these drugs with placebo, and in those cases the control group had the same amount of weight gain and depression as the group that took the drug.  Given the fact that Felicia Hampton is status post hysterectomy and that she took hormone replacement for more than 10 years without any clotting complications, she appears to be an excellent candidate for tamoxifen and that is her choice.  Because of her upcoming conference she will not be starting adjuvant radiation until late this month. Accordingly the target start date for tamoxifen will be 11/22/2016. I'm going to see her approximately 2 months later to make sure she is tolerating it well. If she is the plan will be to continue that for a total of 5 years  He has a good understanding of this plan. She agrees with it. She knows a goal of treatment in her case is cure. She will call with any problems that may develop before her next visit here.   Chauncey Cruel, MD   09/20/2016 3:40 PM Medical Oncology and Hematology [facility] [address] [place], [place] [address] Tel. [phone]    Fax. [phone]

## 2016-09-23 ENCOUNTER — Other Ambulatory Visit: Payer: Self-pay | Admitting: *Deleted

## 2016-09-23 MED ORDER — TAMOXIFEN CITRATE 20 MG PO TABS: 20.0000 mg | ORAL_TABLET | Freq: Every day | ORAL | 3 refills | Status: AC

## 2016-09-26 ENCOUNTER — Telehealth: Payer: Self-pay | Admitting: Oncology

## 2016-09-26 NOTE — Telephone Encounter (Signed)
Patient called and needs to changer her July appointment [place] and needs to move her appointment

## 2016-09-27 ENCOUNTER — Ambulatory Visit
Admission: RE | Admit: 2016-09-27 | Discharge: 2016-09-27 | Disposition: A | Discharge status: Home / Self Care | Payer: Managed Care, Other (non HMO) | Source: Ambulatory Visit | Attending: Radiation Oncology | Admitting: Radiation Oncology

## 2016-09-27 DIAGNOSIS — C50212 Malignant neoplasm of upper-inner quadrant of left female breast: Secondary | ICD-10-CM

## 2016-09-27 DIAGNOSIS — Z170 Estrogen receptor positive status [ER+]: Principal | ICD-10-CM

## 2016-09-27 DIAGNOSIS — Z510 Encounter for antineoplastic radiation therapy: Secondary | ICD-10-CM | POA: Diagnosis not present

## 2016-09-28 NOTE — Progress Notes (Signed)
  Radiation Oncology         [phone] ________________________________  Name: Felicia Hampton MRN: [id number]  Date: 09/27/2016  DOB: [birth year]  SIMULATION AND TREATMENT PLANNING NOTE    ICD-9-CM ICD-10-CM   1. Malignant neoplasm of upper-inner quadrant of left breast in female, estrogen receptor positive (HCC) 174.2 C50.212    V86.0 Z17.0     DIAGNOSIS: Stage IA (pT1a, pN0) grade 1 invasive ductal carcinoma of the left breast (ER+, PR+, HER2-)  NARRATIVE:  The patient was brought to the [facility].  Identity was confirmed.  All relevant records and images related to the planned course of therapy were reviewed.  The patient freely provided informed written consent to proceed with treatment after reviewing the details related to the planned course of therapy. The consent form was witnessed and verified by the simulation staff.  Then, the patient was set-up in a stable reproducible  supine position for radiation therapy.  CT images were obtained.  Surface markings were placed.  The CT images were loaded into the planning software.  Then the target and avoidance structures were contoured.  Treatment planning then occurred.  The radiation prescription was entered and confirmed.  Then, I designed and supervised the construction of a total of 3 medically necessary complex treatment devices.  I have requested : 3D Simulation  I have requested a DVH of the following structures: lumpectomy cavity, heart, lungs.  I have ordered:CBC  PLAN:  The patient will receive 50.4 Gy in 28 fractions. The surgical margins are close (1-2 mm) and the left oncoplastic reduction appears below the lumpectomy cavity on treatment planning CT and therefore a boost of 12 Gy will be given to the lumpectomy cavity.  -----------------------------------  Blair Promise, PhD, MD

## 2016-10-04 ENCOUNTER — Ambulatory Visit: Payer: Managed Care, Other (non HMO) | Admitting: Radiation Oncology

## 2016-10-04 DIAGNOSIS — Z510 Encounter for antineoplastic radiation therapy: Secondary | ICD-10-CM | POA: Diagnosis not present

## 2016-10-05 ENCOUNTER — Ambulatory Visit: Payer: Managed Care, Other (non HMO)

## 2016-10-06 ENCOUNTER — Ambulatory Visit: Payer: Managed Care, Other (non HMO)

## 2016-10-07 ENCOUNTER — Ambulatory Visit
Admission: RE | Admit: 2016-10-07 | Discharge: 2016-10-07 | Disposition: A | Discharge status: Home / Self Care | Payer: Managed Care, Other (non HMO) | Source: Ambulatory Visit | Attending: Radiation Oncology | Admitting: Radiation Oncology

## 2016-10-07 ENCOUNTER — Ambulatory Visit: Payer: Managed Care, Other (non HMO)

## 2016-10-07 DIAGNOSIS — Z510 Encounter for antineoplastic radiation therapy: Secondary | ICD-10-CM | POA: Diagnosis not present

## 2016-10-10 ENCOUNTER — Ambulatory Visit: Payer: Managed Care, Other (non HMO)

## 2016-10-10 ENCOUNTER — Ambulatory Visit
Admission: RE | Admit: 2016-10-10 | Discharge: 2016-10-10 | Disposition: A | Discharge status: Home / Self Care | Payer: Managed Care, Other (non HMO) | Source: Ambulatory Visit | Attending: Radiation Oncology | Admitting: Radiation Oncology

## 2016-10-10 DIAGNOSIS — Z510 Encounter for antineoplastic radiation therapy: Secondary | ICD-10-CM | POA: Diagnosis not present

## 2016-10-10 NOTE — Addendum Note (Signed)
Encounter addended by: Jacqulyn Liner, RN on: 10/10/2016  7:59 AM<BR>    Actions taken: Charge Capture section accepted

## 2016-10-11 ENCOUNTER — Ambulatory Visit
Admission: RE | Admit: 2016-10-11 | Discharge: 2016-10-11 | Disposition: A | Discharge status: Home / Self Care | Payer: Managed Care, Other (non HMO) | Source: Ambulatory Visit | Attending: Radiation Oncology | Admitting: Radiation Oncology

## 2016-10-11 ENCOUNTER — Encounter: Payer: Self-pay | Admitting: Radiation Oncology

## 2016-10-11 VITALS — BP 123/80 | HR 87 | Temp 98.1°F | Resp 18 | Ht 62.0 in | Wt 176.8 lb

## 2016-10-11 DIAGNOSIS — Z170 Estrogen receptor positive status [ER+]: Principal | ICD-10-CM

## 2016-10-11 DIAGNOSIS — C50212 Malignant neoplasm of upper-inner quadrant of left female breast: Secondary | ICD-10-CM

## 2016-10-11 DIAGNOSIS — Z510 Encounter for antineoplastic radiation therapy: Secondary | ICD-10-CM | POA: Diagnosis not present

## 2016-10-11 MED ORDER — ALRA NON-METALLIC DEODORANT (RAD-ONC)
1.0000 "application " | Freq: Once | TOPICAL | Status: AC
  Administered 2016-10-11: 1 via TOPICAL

## 2016-10-11 MED ORDER — RADIAPLEXRX EX GEL
Freq: Once | CUTANEOUS | Status: AC
  Administered 2016-10-11: 13:00:00 via TOPICAL

## 2016-10-11 MED ORDER — ALRA NON-METALLIC DEODORANT (RAD-ONC): 1.0000 "application " | Freq: Once | TOPICAL | Status: DC

## 2016-10-11 MED ORDER — RADIAPLEXRX EX GEL: Freq: Once | CUTANEOUS | Status: DC

## 2016-10-11 NOTE — Progress Notes (Signed)
  Radiation Oncology         [phone] ________________________________  Name: Felicia Hampton MRN: [id number]  Date: 10/11/2016  DOB: 09/27/1955  Weekly Radiation Therapy Management    ICD-9-CM ICD-10-CM   1. Malignant neoplasm of upper-inner quadrant of left breast in female, estrogen receptor positive (HCC) 174.2 C50.212 hyaluronate sodium (RADIAPLEXRX) gel   W23.7 S28.3 non-metallic deodorant (ALRA) 1 application     Current Dose: 3.6 Gy     Planned Dose:  50.4 Gy  Narrative . . . . . . . . The patient presents for routine under treatment assessment.                                    The patient has received 2 fractions of radiation to her left breast. She denies any concerns at this time.                                  Set-up films were reviewed.                                 The chart was checked. Physical Findings. . .  height is 5\' 2"  (1.575 m) and weight is 176 lb 12.8 oz (80.2 kg). Her oral temperature is 98.1 F (36.7 C). Her blood pressure is 123/80 and her pulse is 87. Her respiration is 18 and oxygen saturation is 98%. . Weight essentially stable.  No significant changes. No appreciable radiation reaction of the left breast. Recon scars seen. Small area 7-8 mm eschar along the mid Oncoplastic breast reduction scars. Impression . . . . . . . The patient is tolerating radiation. Plan . . . . . . . . . . . . Continue treatment as planned.  ________________________________   Blair Promise, PhD, MD  This document serves as a record of services personally performed by Gery Pray, MD. It was created on his behalf by Darcus Austin, a trained medical scribe. The creation of this record is based on the scribe's personal observations and the provider's statements to them. This document has been checked and approved by the attending provider.

## 2016-10-11 NOTE — Progress Notes (Signed)
Felicia Hampton has received 2 radiation treatments to her left breast. Patient teaching reviewed  areas of pertinence such as fatigue, hair loss, skin changes, breast tenderness, breast swelling.  Pt able to give teach back of to pat skin, use unscented/gentle soap, and drink plenty of water,apply Radiaplex bid, avoid applying anything to skin within 4 hours of treatment, avoid wearing an under wire bra and to use an electric razor if they must shave. Pt demonstrated understanding of information given and will contact nursing with any questions or concerns.   Denies any of the mentioned areas of pertinence as concerns.  Skin to breast left breast with normal color and intact. New start see education area for documentation. Wt Readings from Last 3 Encounters:  10/11/16 176 lb 12.8 oz (80.2 kg)  09/20/16 180 lb 14.4 oz (82.1 kg)  09/19/16 182 lb 3.2 oz (82.6 kg)  BP 123/80   Pulse 87   Temp 98.1 F (36.7 C) (Oral)   Resp 18   Ht 5\' 2"  (1.575 m)   Wt 176 lb 12.8 oz (80.2 kg)   SpO2 98%   BMI 32.34 kg/m

## 2016-10-12 ENCOUNTER — Ambulatory Visit
Admission: RE | Admit: 2016-10-12 | Discharge: 2016-10-12 | Disposition: A | Discharge status: Home / Self Care | Payer: Managed Care, Other (non HMO) | Source: Ambulatory Visit | Attending: Radiation Oncology | Admitting: Radiation Oncology

## 2016-10-12 DIAGNOSIS — Z510 Encounter for antineoplastic radiation therapy: Secondary | ICD-10-CM | POA: Diagnosis not present

## 2016-10-13 ENCOUNTER — Ambulatory Visit
Admission: RE | Admit: 2016-10-13 | Discharge: 2016-10-13 | Disposition: A | Discharge status: Home / Self Care | Payer: Managed Care, Other (non HMO) | Source: Ambulatory Visit | Attending: Radiation Oncology | Admitting: Radiation Oncology

## 2016-10-13 DIAGNOSIS — Z510 Encounter for antineoplastic radiation therapy: Secondary | ICD-10-CM | POA: Diagnosis not present

## 2016-10-14 ENCOUNTER — Ambulatory Visit
Admission: RE | Admit: 2016-10-14 | Discharge: 2016-10-14 | Disposition: A | Discharge status: Home / Self Care | Payer: Managed Care, Other (non HMO) | Source: Ambulatory Visit | Attending: Radiation Oncology | Admitting: Radiation Oncology

## 2016-10-14 DIAGNOSIS — Z510 Encounter for antineoplastic radiation therapy: Secondary | ICD-10-CM | POA: Diagnosis not present

## 2016-10-16 ENCOUNTER — Ambulatory Visit: Payer: Managed Care, Other (non HMO)

## 2016-10-17 ENCOUNTER — Ambulatory Visit
Admission: RE | Admit: 2016-10-17 | Discharge: 2016-10-17 | Disposition: A | Discharge status: Home / Self Care | Payer: Managed Care, Other (non HMO) | Source: Ambulatory Visit | Attending: Radiation Oncology | Admitting: Radiation Oncology

## 2016-10-17 DIAGNOSIS — Z510 Encounter for antineoplastic radiation therapy: Secondary | ICD-10-CM | POA: Diagnosis not present

## 2016-10-18 ENCOUNTER — Encounter: Payer: Self-pay | Admitting: Radiation Oncology

## 2016-10-18 ENCOUNTER — Ambulatory Visit
Admission: RE | Admit: 2016-10-18 | Discharge: 2016-10-18 | Disposition: A | Discharge status: Home / Self Care | Payer: Managed Care, Other (non HMO) | Source: Ambulatory Visit | Attending: Radiation Oncology | Admitting: Radiation Oncology

## 2016-10-18 VITALS — BP 109/56 | HR 61 | Temp 98.2°F | Ht 62.0 in | Wt 179.6 lb

## 2016-10-18 DIAGNOSIS — Z170 Estrogen receptor positive status [ER+]: Principal | ICD-10-CM

## 2016-10-18 DIAGNOSIS — C50212 Malignant neoplasm of upper-inner quadrant of left female breast: Secondary | ICD-10-CM

## 2016-10-18 DIAGNOSIS — Z510 Encounter for antineoplastic radiation therapy: Secondary | ICD-10-CM | POA: Diagnosis not present

## 2016-10-18 MED ORDER — RADIAPLEXRX EX GEL
Freq: Once | CUTANEOUS | Status: AC
  Administered 2016-10-18: 10:00:00 via TOPICAL

## 2016-10-18 NOTE — Progress Notes (Signed)
  Radiation Oncology         [phone] ________________________________  Name: Felicia Hampton MRN: [id number]  Date: 10/18/2016  DOB: 02-18-1956  Weekly Radiation Therapy Management    ICD-9-CM ICD-10-CM   1. Malignant neoplasm of upper-inner quadrant of left breast in female, estrogen receptor positive (HCC) 174.2 C50.212 hyaluronate sodium (RADIAPLEXRX) gel   V86.0 Z17.0      Current Dose: 12.6 Gy     Planned Dose:  50.4 Gy  Narrative . . . . . . . . The patient presents for routine under treatment assessment.                                    The patient has received 7 fractions of radiation to her left breast. She denies having pain; other than some tingling in her left breast. She reports having nausea twice in the evenings since starting radiation. She reports having fatigue in the afternoons. She is using radiaplex as directed and has been provided with a refill. The nurse notes skin on her left breast is pink.                                  Set-up films were reviewed.                                 The chart was checked. Physical Findings. . .  height is 5\' 2"  (1.575 m) and weight is 179 lb 9.6 oz (81.5 kg). Her oral temperature is 98.2 F (36.8 C). Her blood pressure is 109/56 (abnormal) and her pulse is 61. Her oxygen saturation is 99%. . Weight essentially stable. Lungs are clear to auscultation bilaterally. Heart has regular rate and rhythm. Moderate erythema to the left breast. Impression . . . . . . . The patient is tolerating radiation. Plan . . . . . . . . . . . . Continue treatment as planned. I offered the patient nausea medication, but she declined at this time due not wanting to take additional medications on top of the many others she is taking. ________________________________   Blair Promise, PhD, MD  This document serves as a record of services personally performed by Gery Pray, MD. It was created on his behalf by Felicia Hampton, a trained medical scribe.  The creation of this record is based on the scribe's personal observations and the provider's statements to them. This document has been checked and approved by the attending provider.

## 2016-10-18 NOTE — Progress Notes (Signed)
Felicia Hampton has completed 7 fractions to her left breast.  She denies having pain other than some tingling in her left breast.  She reports having nausea twice in the evenings since starting radiation.  She reports having fatigue in the afternoons.  She is using radiaplex as directed and has been given a refill.  The skin on her left breast is pink.  BP (!) 109/56 (BP Location: Right Arm, Patient Position: Sitting)   Pulse 61   Temp 98.2 F (36.8 C) (Oral)   Ht 5\' 2"  (1.575 m)   Wt 179 lb 9.6 oz (81.5 kg)   SpO2 99%   BMI 32.85 kg/m    Wt Readings from Last 3 Encounters:  10/18/16 179 lb 9.6 oz (81.5 kg)  10/11/16 176 lb 12.8 oz (80.2 kg)  09/20/16 180 lb 14.4 oz (82.1 kg)

## 2016-10-19 ENCOUNTER — Ambulatory Visit
Admission: RE | Admit: 2016-10-19 | Discharge: 2016-10-19 | Disposition: A | Discharge status: Home / Self Care | Payer: Managed Care, Other (non HMO) | Source: Ambulatory Visit | Attending: Radiation Oncology | Admitting: Radiation Oncology

## 2016-10-19 DIAGNOSIS — Z510 Encounter for antineoplastic radiation therapy: Secondary | ICD-10-CM | POA: Diagnosis not present

## 2016-10-20 ENCOUNTER — Ambulatory Visit
Admission: RE | Admit: 2016-10-20 | Discharge: 2016-10-20 | Disposition: A | Discharge status: Home / Self Care | Payer: Managed Care, Other (non HMO) | Source: Ambulatory Visit | Attending: Radiation Oncology | Admitting: Radiation Oncology

## 2016-10-20 DIAGNOSIS — Z510 Encounter for antineoplastic radiation therapy: Secondary | ICD-10-CM | POA: Diagnosis not present

## 2016-10-21 ENCOUNTER — Ambulatory Visit
Admission: RE | Admit: 2016-10-21 | Discharge: 2016-10-21 | Disposition: A | Discharge status: Home / Self Care | Payer: Managed Care, Other (non HMO) | Source: Ambulatory Visit | Attending: Radiation Oncology | Admitting: Radiation Oncology

## 2016-10-21 DIAGNOSIS — Z510 Encounter for antineoplastic radiation therapy: Secondary | ICD-10-CM | POA: Diagnosis not present

## 2016-10-24 ENCOUNTER — Ambulatory Visit
Admission: RE | Admit: 2016-10-24 | Discharge: 2016-10-24 | Disposition: A | Discharge status: Home / Self Care | Payer: Managed Care, Other (non HMO) | Source: Ambulatory Visit | Attending: Radiation Oncology | Admitting: Radiation Oncology

## 2016-10-24 DIAGNOSIS — Z510 Encounter for antineoplastic radiation therapy: Secondary | ICD-10-CM | POA: Diagnosis not present

## 2016-10-25 ENCOUNTER — Ambulatory Visit
Admission: RE | Admit: 2016-10-25 | Discharge: 2016-10-25 | Disposition: A | Discharge status: Home / Self Care | Payer: Managed Care, Other (non HMO) | Source: Ambulatory Visit | Attending: Radiation Oncology | Admitting: Radiation Oncology

## 2016-10-25 ENCOUNTER — Other Ambulatory Visit: Payer: Self-pay | Admitting: Radiation Oncology

## 2016-10-25 DIAGNOSIS — Z170 Estrogen receptor positive status [ER+]: Principal | ICD-10-CM

## 2016-10-25 DIAGNOSIS — C50212 Malignant neoplasm of upper-inner quadrant of left female breast: Secondary | ICD-10-CM

## 2016-10-25 DIAGNOSIS — Z510 Encounter for antineoplastic radiation therapy: Secondary | ICD-10-CM | POA: Diagnosis not present

## 2016-10-25 MED ORDER — ONDANSETRON HCL 4 MG PO TABS: 4.0000 mg | ORAL_TABLET | Freq: Three times a day (TID) | ORAL | 0 refills | Status: DC | PRN

## 2016-10-26 ENCOUNTER — Ambulatory Visit
Admission: RE | Admit: 2016-10-26 | Discharge: 2016-10-26 | Disposition: A | Discharge status: Home / Self Care | Payer: Managed Care, Other (non HMO) | Source: Ambulatory Visit | Attending: Radiation Oncology | Admitting: Radiation Oncology

## 2016-10-26 DIAGNOSIS — Z510 Encounter for antineoplastic radiation therapy: Secondary | ICD-10-CM | POA: Diagnosis not present

## 2016-10-27 ENCOUNTER — Ambulatory Visit
Admission: RE | Admit: 2016-10-27 | Discharge: 2016-10-27 | Disposition: A | Discharge status: Home / Self Care | Payer: Managed Care, Other (non HMO) | Source: Ambulatory Visit | Attending: Radiation Oncology | Admitting: Radiation Oncology

## 2016-10-27 DIAGNOSIS — Z510 Encounter for antineoplastic radiation therapy: Secondary | ICD-10-CM | POA: Diagnosis not present

## 2016-10-28 ENCOUNTER — Ambulatory Visit
Admission: RE | Admit: 2016-10-28 | Discharge: 2016-10-28 | Disposition: A | Discharge status: Home / Self Care | Payer: Managed Care, Other (non HMO) | Source: Ambulatory Visit | Attending: Radiation Oncology | Admitting: Radiation Oncology

## 2016-10-28 DIAGNOSIS — Z510 Encounter for antineoplastic radiation therapy: Secondary | ICD-10-CM | POA: Diagnosis not present

## 2016-10-29 ENCOUNTER — Ambulatory Visit: Payer: Managed Care, Other (non HMO)

## 2016-10-31 ENCOUNTER — Ambulatory Visit
Admission: RE | Admit: 2016-10-31 | Discharge: 2016-10-31 | Disposition: A | Discharge status: Home / Self Care | Payer: Managed Care, Other (non HMO) | Source: Ambulatory Visit | Attending: Radiation Oncology | Admitting: Radiation Oncology

## 2016-10-31 DIAGNOSIS — Z510 Encounter for antineoplastic radiation therapy: Secondary | ICD-10-CM | POA: Diagnosis not present

## 2016-11-01 ENCOUNTER — Ambulatory Visit
Admission: RE | Admit: 2016-11-01 | Discharge: 2016-11-01 | Disposition: A | Discharge status: Home / Self Care | Payer: Managed Care, Other (non HMO) | Source: Ambulatory Visit | Attending: Radiation Oncology | Admitting: Radiation Oncology

## 2016-11-01 DIAGNOSIS — C50212 Malignant neoplasm of upper-inner quadrant of left female breast: Secondary | ICD-10-CM

## 2016-11-01 DIAGNOSIS — Z170 Estrogen receptor positive status [ER+]: Principal | ICD-10-CM

## 2016-11-01 DIAGNOSIS — Z510 Encounter for antineoplastic radiation therapy: Secondary | ICD-10-CM | POA: Diagnosis not present

## 2016-11-01 MED ORDER — BIAFINE EX EMUL
Freq: Two times a day (BID) | CUTANEOUS | Status: DC
  Administered 2016-11-01: 09:00:00 via TOPICAL

## 2016-11-02 ENCOUNTER — Ambulatory Visit
Admission: RE | Admit: 2016-11-02 | Discharge: 2016-11-02 | Disposition: A | Discharge status: Home / Self Care | Payer: Managed Care, Other (non HMO) | Source: Ambulatory Visit | Attending: Radiation Oncology | Admitting: Radiation Oncology

## 2016-11-02 DIAGNOSIS — Z510 Encounter for antineoplastic radiation therapy: Secondary | ICD-10-CM | POA: Diagnosis not present

## 2016-11-03 ENCOUNTER — Ambulatory Visit
Admission: RE | Admit: 2016-11-03 | Discharge: 2016-11-03 | Disposition: A | Discharge status: Home / Self Care | Payer: Managed Care, Other (non HMO) | Source: Ambulatory Visit | Attending: Radiation Oncology | Admitting: Radiation Oncology

## 2016-11-03 DIAGNOSIS — Z510 Encounter for antineoplastic radiation therapy: Secondary | ICD-10-CM | POA: Diagnosis not present

## 2016-11-04 ENCOUNTER — Ambulatory Visit
Admission: RE | Admit: 2016-11-04 | Discharge: 2016-11-04 | Disposition: A | Discharge status: Home / Self Care | Payer: Managed Care, Other (non HMO) | Source: Ambulatory Visit | Attending: Radiation Oncology | Admitting: Radiation Oncology

## 2016-11-04 DIAGNOSIS — Z510 Encounter for antineoplastic radiation therapy: Secondary | ICD-10-CM | POA: Diagnosis not present

## 2016-11-05 ENCOUNTER — Ambulatory Visit: Payer: Managed Care, Other (non HMO)

## 2016-11-07 ENCOUNTER — Ambulatory Visit
Admission: RE | Admit: 2016-11-07 | Discharge: 2016-11-07 | Disposition: A | Discharge status: Home / Self Care | Payer: Managed Care, Other (non HMO) | Source: Ambulatory Visit | Attending: Radiation Oncology | Admitting: Radiation Oncology

## 2016-11-07 DIAGNOSIS — C50212 Malignant neoplasm of upper-inner quadrant of left female breast: Secondary | ICD-10-CM

## 2016-11-07 DIAGNOSIS — Z510 Encounter for antineoplastic radiation therapy: Secondary | ICD-10-CM | POA: Diagnosis not present

## 2016-11-07 DIAGNOSIS — Z170 Estrogen receptor positive status [ER+]: Principal | ICD-10-CM

## 2016-11-07 MED ORDER — BIAFINE EX EMUL
Freq: Once | CUTANEOUS | Status: AC
  Administered 2016-11-07: 08:00:00 via TOPICAL

## 2016-11-08 ENCOUNTER — Ambulatory Visit
Admission: RE | Admit: 2016-11-08 | Discharge: 2016-11-08 | Disposition: A | Discharge status: Home / Self Care | Payer: Managed Care, Other (non HMO) | Source: Ambulatory Visit | Attending: Radiation Oncology | Admitting: Radiation Oncology

## 2016-11-08 DIAGNOSIS — Z510 Encounter for antineoplastic radiation therapy: Secondary | ICD-10-CM | POA: Diagnosis not present

## 2016-11-08 DIAGNOSIS — C50212 Malignant neoplasm of upper-inner quadrant of left female breast: Secondary | ICD-10-CM

## 2016-11-08 DIAGNOSIS — Z170 Estrogen receptor positive status [ER+]: Principal | ICD-10-CM

## 2016-11-08 MED ORDER — BIAFINE EX EMUL
Freq: Once | CUTANEOUS | Status: AC
  Administered 2016-11-08: 09:00:00 via TOPICAL

## 2016-11-09 ENCOUNTER — Ambulatory Visit
Admission: RE | Admit: 2016-11-09 | Discharge: 2016-11-09 | Disposition: A | Discharge status: Home / Self Care | Payer: Managed Care, Other (non HMO) | Source: Ambulatory Visit | Attending: Radiation Oncology | Admitting: Radiation Oncology

## 2016-11-09 DIAGNOSIS — Z510 Encounter for antineoplastic radiation therapy: Secondary | ICD-10-CM | POA: Diagnosis not present

## 2016-11-10 ENCOUNTER — Ambulatory Visit
Admission: RE | Admit: 2016-11-10 | Discharge: 2016-11-10 | Disposition: A | Discharge status: Home / Self Care | Payer: Managed Care, Other (non HMO) | Source: Ambulatory Visit | Attending: Radiation Oncology | Admitting: Radiation Oncology

## 2016-11-10 DIAGNOSIS — Z510 Encounter for antineoplastic radiation therapy: Secondary | ICD-10-CM | POA: Diagnosis not present

## 2016-11-11 ENCOUNTER — Ambulatory Visit
Admission: RE | Admit: 2016-11-11 | Discharge: 2016-11-11 | Disposition: A | Discharge status: Home / Self Care | Payer: Managed Care, Other (non HMO) | Source: Ambulatory Visit | Attending: Radiation Oncology | Admitting: Radiation Oncology

## 2016-11-11 ENCOUNTER — Ambulatory Visit: Payer: Managed Care, Other (non HMO)

## 2016-11-11 DIAGNOSIS — Z510 Encounter for antineoplastic radiation therapy: Secondary | ICD-10-CM | POA: Diagnosis not present

## 2016-11-14 ENCOUNTER — Ambulatory Visit
Admission: RE | Admit: 2016-11-14 | Discharge: 2016-11-14 | Disposition: A | Discharge status: Home / Self Care | Payer: Managed Care, Other (non HMO) | Source: Ambulatory Visit | Attending: Radiation Oncology | Admitting: Radiation Oncology

## 2016-11-14 DIAGNOSIS — Z510 Encounter for antineoplastic radiation therapy: Secondary | ICD-10-CM | POA: Diagnosis not present

## 2016-11-15 ENCOUNTER — Ambulatory Visit: Payer: Managed Care, Other (non HMO) | Admitting: Radiation Oncology

## 2016-11-15 ENCOUNTER — Ambulatory Visit
Admission: RE | Admit: 2016-11-15 | Discharge: 2016-11-15 | Disposition: A | Discharge status: Home / Self Care | Payer: Managed Care, Other (non HMO) | Source: Ambulatory Visit | Attending: Radiation Oncology | Admitting: Radiation Oncology

## 2016-11-15 DIAGNOSIS — Z510 Encounter for antineoplastic radiation therapy: Secondary | ICD-10-CM | POA: Diagnosis not present

## 2016-11-15 DIAGNOSIS — Z170 Estrogen receptor positive status [ER+]: Principal | ICD-10-CM

## 2016-11-15 DIAGNOSIS — C50212 Malignant neoplasm of upper-inner quadrant of left female breast: Secondary | ICD-10-CM

## 2016-11-15 MED ORDER — BIAFINE EX EMUL
Freq: Once | CUTANEOUS | Status: AC
  Administered 2016-11-15: 10:00:00 via TOPICAL

## 2016-11-16 ENCOUNTER — Ambulatory Visit
Admission: RE | Admit: 2016-11-16 | Discharge: 2016-11-16 | Disposition: A | Discharge status: Home / Self Care | Payer: Managed Care, Other (non HMO) | Source: Ambulatory Visit | Attending: Radiation Oncology | Admitting: Radiation Oncology

## 2016-11-16 ENCOUNTER — Ambulatory Visit: Payer: Managed Care, Other (non HMO)

## 2016-11-16 DIAGNOSIS — Z510 Encounter for antineoplastic radiation therapy: Secondary | ICD-10-CM | POA: Diagnosis not present

## 2016-11-17 ENCOUNTER — Ambulatory Visit
Admission: RE | Admit: 2016-11-17 | Discharge: 2016-11-17 | Disposition: A | Discharge status: Home / Self Care | Payer: Managed Care, Other (non HMO) | Source: Ambulatory Visit | Attending: Radiation Oncology | Admitting: Radiation Oncology

## 2016-11-17 DIAGNOSIS — Z510 Encounter for antineoplastic radiation therapy: Secondary | ICD-10-CM | POA: Diagnosis not present

## 2016-11-18 ENCOUNTER — Ambulatory Visit
Admission: RE | Admit: 2016-11-18 | Discharge: 2016-11-18 | Disposition: A | Discharge status: Home / Self Care | Payer: Managed Care, Other (non HMO) | Source: Ambulatory Visit | Attending: Radiation Oncology | Admitting: Radiation Oncology

## 2016-11-18 DIAGNOSIS — C50212 Malignant neoplasm of upper-inner quadrant of left female breast: Secondary | ICD-10-CM

## 2016-11-18 DIAGNOSIS — Z510 Encounter for antineoplastic radiation therapy: Secondary | ICD-10-CM | POA: Diagnosis not present

## 2016-11-18 DIAGNOSIS — Z170 Estrogen receptor positive status [ER+]: Principal | ICD-10-CM

## 2016-11-18 MED ORDER — BIAFINE EX EMUL
Freq: Once | CUTANEOUS | Status: AC
  Administered 2016-11-18: 08:00:00 via TOPICAL

## 2016-11-18 MED ORDER — BIAFINE EX EMUL: Freq: Once | CUTANEOUS | Status: DC

## 2016-11-21 ENCOUNTER — Ambulatory Visit
Admission: RE | Admit: 2016-11-21 | Discharge: 2016-11-21 | Disposition: A | Discharge status: Home / Self Care | Payer: Managed Care, Other (non HMO) | Source: Ambulatory Visit | Attending: Radiation Oncology | Admitting: Radiation Oncology

## 2016-11-21 DIAGNOSIS — Z510 Encounter for antineoplastic radiation therapy: Secondary | ICD-10-CM | POA: Diagnosis not present

## 2016-11-22 ENCOUNTER — Ambulatory Visit
Admission: RE | Admit: 2016-11-22 | Discharge: 2016-11-22 | Disposition: A | Discharge status: Home / Self Care | Payer: Managed Care, Other (non HMO) | Source: Ambulatory Visit | Attending: Radiation Oncology | Admitting: Radiation Oncology

## 2016-11-22 DIAGNOSIS — C50212 Malignant neoplasm of upper-inner quadrant of left female breast: Secondary | ICD-10-CM

## 2016-11-22 DIAGNOSIS — Z510 Encounter for antineoplastic radiation therapy: Secondary | ICD-10-CM | POA: Diagnosis not present

## 2016-11-22 DIAGNOSIS — Z170 Estrogen receptor positive status [ER+]: Principal | ICD-10-CM

## 2016-11-22 MED ORDER — BIAFINE EX EMUL
Freq: Once | CUTANEOUS | Status: AC
  Administered 2016-11-22: 09:00:00 via TOPICAL

## 2016-11-23 ENCOUNTER — Ambulatory Visit
Admission: RE | Admit: 2016-11-23 | Discharge: 2016-11-23 | Disposition: A | Discharge status: Home / Self Care | Payer: Managed Care, Other (non HMO) | Source: Ambulatory Visit | Attending: Radiation Oncology | Admitting: Radiation Oncology

## 2016-11-23 ENCOUNTER — Ambulatory Visit: Payer: Managed Care, Other (non HMO)

## 2016-11-23 DIAGNOSIS — Z510 Encounter for antineoplastic radiation therapy: Secondary | ICD-10-CM | POA: Diagnosis not present

## 2016-11-24 ENCOUNTER — Ambulatory Visit
Admission: RE | Admit: 2016-11-24 | Discharge: 2016-11-24 | Disposition: A | Discharge status: Home / Self Care | Payer: Managed Care, Other (non HMO) | Source: Ambulatory Visit | Attending: Radiation Oncology | Admitting: Radiation Oncology

## 2016-11-24 DIAGNOSIS — Z510 Encounter for antineoplastic radiation therapy: Secondary | ICD-10-CM | POA: Diagnosis not present

## 2016-11-29 ENCOUNTER — Telehealth: Payer: Self-pay | Admitting: *Deleted

## 2016-11-29 NOTE — Telephone Encounter (Signed)
Spoke with patient to follow up after XRT complete.  She is doing well.  Discussed survivorship program and informed her she would get a call for an appointment.  Encouraged her to call with any needs or concerns

## 2016-11-30 ENCOUNTER — Encounter: Payer: Self-pay | Admitting: Radiation Oncology

## 2016-11-30 NOTE — Progress Notes (Signed)
  Radiation Oncology         [phone] ________________________________  Name: Felicia Hampton MRN: [id number]  Date: 11/30/2016  DOB: 08-23-1955  End of Treatment Note  Diagnosis:   Stage IA (pT1a, pN0) grade 1 invasive ductal carcinoma of the left breast, ER/PR positive, Her2 negative   Indication for treatment:  Curative  Radiation treatment dates:  10/10/16 - 11/24/16  Site/dose:   Left Breast treated to 50.4 Gy in 28 fractions, and then Boosted an additional 12 Gy in 6 fractions  Beams/energy:   3D  //  10X, 6X  Narrative: The patient tolerated radiation treatment relatively well. Throughout radiation the patient reported frequent sharp pains in her left breast, which was alleviated with Ultram. She also reported significant fatigue, sometimes napping twice daily. She used Biafine and Neosporin as needed to the skin within the treatment field.  Plan: The patient has completed radiation treatment. The patient will use Biafine to the skin within the treatment field for at least 2 weeks. She may also continue to use Neosporin to the area as needed. The patient will return to radiation oncology clinic for routine followup in one month. I advised them to call or return sooner if they have any questions or concerns related to their recovery or treatment.  -----------------------------------  Blair Promise, PhD, MD  This document serves as a record of services personally performed by Gery Pray, MD. It was created on his behalf by Maryla Morrow, a trained medical scribe. The creation of this record is based on the scribe's personal observations and the provider's statements to them. This document has been checked and approved by the attending provider.

## 2016-12-21 ENCOUNTER — Encounter: Payer: Self-pay | Admitting: Oncology

## 2016-12-26 ENCOUNTER — Ambulatory Visit
Admission: RE | Admit: 2016-12-26 | Discharge: 2016-12-26 | Disposition: A | Discharge status: Home / Self Care | Payer: Managed Care, Other (non HMO) | Source: Ambulatory Visit | Attending: Radiation Oncology | Admitting: Radiation Oncology

## 2016-12-26 ENCOUNTER — Encounter: Payer: Self-pay | Admitting: Radiation Oncology

## 2016-12-26 DIAGNOSIS — E7800 Pure hypercholesterolemia, unspecified: Secondary | ICD-10-CM | POA: Diagnosis not present

## 2016-12-26 DIAGNOSIS — Z803 Family history of malignant neoplasm of breast: Secondary | ICD-10-CM | POA: Diagnosis not present

## 2016-12-26 DIAGNOSIS — Z8041 Family history of malignant neoplasm of ovary: Secondary | ICD-10-CM | POA: Insufficient documentation

## 2016-12-26 DIAGNOSIS — Z79899 Other long term (current) drug therapy: Secondary | ICD-10-CM | POA: Insufficient documentation

## 2016-12-26 DIAGNOSIS — Z885 Allergy status to narcotic agent status: Secondary | ICD-10-CM | POA: Insufficient documentation

## 2016-12-26 DIAGNOSIS — Z87891 Personal history of nicotine dependence: Secondary | ICD-10-CM | POA: Diagnosis not present

## 2016-12-26 DIAGNOSIS — Z170 Estrogen receptor positive status [ER+]: Secondary | ICD-10-CM | POA: Diagnosis not present

## 2016-12-26 DIAGNOSIS — Z8042 Family history of malignant neoplasm of prostate: Secondary | ICD-10-CM | POA: Insufficient documentation

## 2016-12-26 DIAGNOSIS — Z888 Allergy status to other drugs, medicaments and biological substances status: Secondary | ICD-10-CM | POA: Diagnosis not present

## 2016-12-26 DIAGNOSIS — Z800 Family history of malignant neoplasm of digestive organs: Secondary | ICD-10-CM | POA: Insufficient documentation

## 2016-12-26 DIAGNOSIS — Z510 Encounter for antineoplastic radiation therapy: Secondary | ICD-10-CM | POA: Diagnosis present

## 2016-12-26 DIAGNOSIS — F909 Attention-deficit hyperactivity disorder, unspecified type: Secondary | ICD-10-CM | POA: Insufficient documentation

## 2016-12-26 DIAGNOSIS — C50212 Malignant neoplasm of upper-inner quadrant of left female breast: Secondary | ICD-10-CM

## 2016-12-26 NOTE — Progress Notes (Addendum)
Felicia Hampton is here today for 1 month follow up for left breast radiation.  Patient denies having any pain currently but does report having stabbing sharp pains to the left breast, reports a great deal of tightness to the area.  She also states she is unable to tolerate wearing a bra for extended periods during the day because of the skin irritation and compress it pushes on the incision areas.  Patient reports appetite is good, and energy is improving.  She is reporting that she is not using any radiaplex or biafine but states she is having some itching to the mid chest area.    Vitals:   12/26/16 0952  BP: 134/89  Pulse: 90  Resp: 20  Temp: 98.3 F (36.8 C)  TempSrc: Oral  SpO2: 98%  Weight: 178 lb 6.4 oz (80.9 kg)   Wt Readings from Last 3 Encounters:  12/26/16 178 lb 6.4 oz (80.9 kg)  10/18/16 179 lb 9.6 oz (81.5 kg)  10/11/16 176 lb 12.8 oz (80.2 kg)

## 2016-12-26 NOTE — Progress Notes (Signed)
Radiation Oncology         [phone] ________________________________  Name: Felicia Hampton MRN: [id number]  Date: 12/26/2016  DOB: October 25, 1955  Follow-Up Visit Note  CC: System, Pcp Not In  Magrinat, Virgie Dad, MD    ICD-10-CM   1. Malignant neoplasm of upper-inner quadrant of left breast in female, estrogen receptor positive ([place]) C50.212    Z17.0     Diagnosis:   Stage IA (pT1a, pN0) grade 1 invasive ductal carcinoma of the left breast, ER/PR positive, Her2 negative  Interval Since Last Radiation:  1 month 10/10/16 - 11/24/16 : Left Breast treated to 50.4 Gy in 28 fractions, then Boosted an additional 12 Gy in 6 fractions  Narrative:  The patient returns today for routine follow-up of radiation completed 11/24/16.    On review of systems, the patient denies having pain currently, but does report having sharp, stabbing pains to the left breast occasionally. She also reports a feeling of tightness to the left breast. She is unable to tolerate wearing a bra for extended periods of time during the day because of the skin irritation and pressure on her incision areas. The patient also reports some itching to the mid chest area. She does not take pain medication, and is not using Radiaplex. She reports a good appetite, and an improvement in her energy levels recently.                         ALLERGIES:  is allergic to ace inhibitors; diclofenac; morphine; morphine and related; and oxycodone.  Meds: Current Outpatient Prescriptions  Medication Sig Dispense Refill  . acetaminophen (TYLENOL) 325 MG tablet Take 650 mg by mouth every 6 (six) hours as needed.    Marland Kitchen amphetamine-dextroamphetamine (ADDERALL) 10 MG tablet Take 10 mg by mouth 3 (three) times a week.    Marland Kitchen atorvastatin (LIPITOR) 20 MG tablet Take 20 mg by mouth daily at 6 PM.    . Cholecalciferol (VITAMIN D) 2000 units tablet Take by mouth.    . fluticasone (FLONASE) 50 MCG/ACT nasal spray Place into the nose.    . Loratadine 10 MG  CAPS Take by mouth.    . Multiple Vitamin (MULTIVITAMIN) capsule Take by mouth.    . zolpidem (AMBIEN) 10 MG tablet Take 10 mg by mouth at bedtime as needed for sleep.    . hyaluronate sodium (RADIAPLEXRX) GEL Apply 1 application topically 2 (two) times daily.    . non-metallic deodorant Jethro Poling) MISC Apply 1 application topically daily as needed.    . ondansetron (ZOFRAN) 4 MG tablet Take 1 tablet (4 mg total) by mouth every 8 (eight) hours as needed for nausea or vomiting. (Patient not taking: Reported on 12/26/2016) 20 tablet 0  . tamoxifen (NOLVADEX) 20 MG tablet Take 1 tablet by mouth daily.  0  . traMADol (ULTRAM) 50 MG tablet Take 1-2 tablets (50-100 mg total) by mouth every 6 (six) hours as needed. (Patient not taking: Reported on 10/11/2016) 30 tablet 0   No current facility-administered medications for this encounter.    REVIEW OF SYSTEMS: A 10+ POINT REVIEW OF SYSTEMS WAS OBTAINED including neurology, dermatology, psychiatry, cardiac, respiratory, lymph, extremities, GI, GU, musculoskeletal, constitutional, reproductive, HEENT. All pertinent positives are noted in the HPI. All others are negative.  Physical Findings: The patient is in no acute distress. Patient is alert and oriented.  weight is 178 lb 6.4 oz (80.9 kg). Her oral temperature is 98.3 F (36.8 C). Her  blood pressure is 134/89 and her pulse is 90. Her respiration is 20 and oxygen saturation is 98%.  No significant changes. Lungs are clear to auscultation bilaterally. Heart has regular rate and rhythm. No palpable cervical, supraclavicular, or axillary adenopathy. Abdomen soft, non-tender, normal bowel sounds. Breast: Right breast shows no palpable mass or nipple discharge. Skin well healed from reconstructive surgery. Left breast shows skin well healed, with mild hyperpigmentation changes noted. No palpable mass or nipple discharge.  Lab Findings: Lab Results  Component Value Date   WBC 7.2 07/26/2016   HGB 13.4 07/26/2016     HCT 38.9 07/26/2016   MCV 83.5 07/26/2016   PLT 301 07/26/2016    Radiographic Findings: No results found.  Impression:  The patient is recovering from the effects of radiation. Patient is doing well at this time with minimal itching within the breast area.  Plan:  Continue to follow closely with Dr. Donne Hazel and Dr. Jana Hakim as indicated. The patient will follow with me on an as needed basis. She may contact the clinic with any questions or concerns that arise. -----------------------------------  Blair Promise, PhD, MD  This document serves as a record of services personally performed by Gery Pray, MD. It was created on his behalf by Maryla Morrow, a trained medical scribe. The creation of this record is based on the scribe's personal observations and the provider's statements to them. This document has been checked and approved by the attending provider.

## 2017-01-19 ENCOUNTER — Other Ambulatory Visit: Payer: Managed Care, Other (non HMO)

## 2017-01-19 ENCOUNTER — Ambulatory Visit: Payer: Managed Care, Other (non HMO) | Admitting: Oncology

## 2017-02-10 ENCOUNTER — Ambulatory Visit (HOSPITAL_BASED_OUTPATIENT_CLINIC_OR_DEPARTMENT_OTHER): Payer: Managed Care, Other (non HMO) | Admitting: Adult Health

## 2017-02-10 ENCOUNTER — Telehealth: Payer: Self-pay

## 2017-02-10 ENCOUNTER — Encounter: Payer: Self-pay | Admitting: Adult Health

## 2017-02-10 VITALS — BP 105/64 | HR 74 | Temp 98.1°F | Resp 18 | Ht 62.0 in | Wt 177.2 lb

## 2017-02-10 DIAGNOSIS — Z170 Estrogen receptor positive status [ER+]: Secondary | ICD-10-CM

## 2017-02-10 DIAGNOSIS — Z79810 Long term (current) use of selective estrogen receptor modulators (SERMs): Secondary | ICD-10-CM

## 2017-02-10 DIAGNOSIS — C50212 Malignant neoplasm of upper-inner quadrant of left female breast: Secondary | ICD-10-CM

## 2017-02-10 NOTE — Progress Notes (Signed)
CLINIC:  Survivorship   REASON FOR VISIT:  Routine follow-up post-treatment for a recent history of breast cancer.  BRIEF ONCOLOGIC HISTORY:    Malignant neoplasm of upper-inner quadrant of left breast in female, estrogen receptor positive ([place])   07/12/2016 Initial Biopsy    Left breast needle core biopsy UIQ: IDC, lobular neoplasia, grade 1, ER+(100%), PR+(100%), Ki-67 2%, HER-2 negative (ratio 1.89).       07/26/2016 Initial Diagnosis    Malignant neoplasm of upper-inner quadrant of left breast in female, estrogen receptor positive ([place])     08/11/2016 Surgery    Left breast lumpectomy: IDC, 0.5cm, grade 1, atypical lobular hyperplasia, margins neg, T1a, N0      10/10/2016 - 11/24/2016 Radiation Therapy    Adjuvant radiation ([place]): Left Breast treated to 50.4 Gy in 28 fractions, and then Boosted an additional 12 Gy in 6 fractions      11/2016 -  Anti-estrogen oral therapy    Tamoxifen daily       INTERVAL HISTORY:  Felicia Hampton presents to the [facility] today for our initial meeting to review her survivorship care plan detailing her treatment course for breast cancer, as well as monitoring long-term side effects of that treatment, education regarding health maintenance, screening, and overall wellness and health promotion.     Overall, Felicia Hampton reports feeling quite well.  She is taking Tamoxifen daily.  She did have to stop however because after six weeks of taking the Tamoxifen she noted dry eyes.  She has been evaluated by an ophthalmologist.  She has stopped the tamoxifen and started eye gtts.  She will restart the Tamoxifen next week.  Otherwise she is well and without any questions or concerns.      REVIEW OF SYSTEMS:  Review of Systems  Constitutional: Negative for appetite change, chills, fatigue and fever.  HENT:   Negative for hearing loss and lump/mass.   Eyes: Negative for eye problems and icterus.  Respiratory: Negative for chest tightness, cough and  shortness of breath.   Cardiovascular: Negative for chest pain, leg swelling and palpitations.  Gastrointestinal: Negative for abdominal distention and abdominal pain.  Endocrine: Negative for hot flashes.  Genitourinary: Negative for difficulty urinating.   Musculoskeletal: Negative for arthralgias.  Skin: Negative for itching and rash.  Neurological: Negative for dizziness, extremity weakness, headaches and numbness.  Hematological: Negative for adenopathy. Does not bruise/bleed easily.  Psychiatric/Behavioral: Negative for depression. The patient is not nervous/anxious.    Breast: Denies any new nodularity, masses, tenderness, nipple changes, or nipple discharge.      ONCOLOGY TREATMENT TEAM:  1. Surgeon:  Dr. Donne [facility]el at [facility] 2. Medical Oncologist: Dr. Jana Hakim     PAST MEDICAL/SURGICAL HISTORY:  Past Medical History:  Diagnosis Date  . Adult ADHD   . Cancer [facility]) 2018   left breast  . Family history of breast cancer   . Family history of colon cancer   . Family history of ovarian cancer   . Genetic testing 08/02/2016   Negative; No pathogenic mutations detected. STAT Breast panel with reflex to Common Cancers panel  Genes tested were the 43 genes on [facility]'s Common Cancers panel (APC, ATM, AXIN2, BARD1, BMPR1A, BRCA1, BRCA2, BRIP1, CDH1, CDKN2A, CHEK2, DICER1, EPCAM, GREM1, HOXB13, KIT, MEN1, MLH1, MSH2, MSH6, MUTYH, NBN, NF1, PALB2, PDGFRA, PMS2, POLD1, POLE, PTEN, RAD50, RAD51C, RAD51D, SDHA, SDHB, SDHC, SDHD  . History of radiation therapy 10/10/16-11/24/16   left breast 50.4 Gy in 28 fractions, left breast  boost 12 Gy in 6 fractions  . Hypercholesteremia   . PONV (postoperative nausea and vomiting)    Past Surgical History:  Procedure Laterality Date  . ABDOMINAL HYSTERECTOMY    . BREAST REDUCTION SURGERY Bilateral 08/23/2016   Procedure: BILATERAL ONCOPLASTIC BREAST REDUCTION;  Surgeon: Irene Limbo, MD;  Location: [facility];   Service: Plastics;  Laterality: Bilateral;  . KNEE ARTHROSCOPY     x3  . RADIOACTIVE SEED GUIDED MASTECTOMY WITH AXILLARY SENTINEL LYMPH NODE BIOPSY Left 08/11/2016   Procedure: RADIOACTIVE SEED GUIDED LEFT BREAST LUMPECTOMY WITH AXILLARY SENTINEL LYMPH NODE BIOPSY;  Surgeon: Rolm Bookbinder, MD;  Location: [facility];  Service: General;  Laterality: Left;  . SHOULDER ARTHROSCOPY W/ ROTATOR CUFF REPAIR Right      ALLERGIES:  Allergies  Allergen Reactions  . Ace Inhibitors Palpitations and Shortness Of Breath    COX-2 inhibitors  . Diclofenac     Other reaction(s): Hypotension (ALLERGY/intolerance)  . Morphine Nausea And Vomiting  . Morphine And Related Nausea And Vomiting  . Oxycodone Other (See Comments)    hallucinations     CURRENT MEDICATIONS:  Outpatient Encounter Prescriptions as of 02/10/2017  Medication Sig Note  . amphetamine-dextroamphetamine (ADDERALL) 10 MG tablet Take 10 mg by mouth 3 (three) times a week.   Marland Kitchen atorvastatin (LIPITOR) 20 MG tablet Take 20 mg by mouth daily at 6 PM.   . Cholecalciferol (VITAMIN D) 2000 units tablet Take by mouth.   . fluticasone (FLONASE) 50 MCG/ACT nasal spray Place into the nose.   . hyaluronate sodium (RADIAPLEXRX) GEL Apply 1 application topically 2 (two) times daily.   Marland Kitchen Lifitegrast (XIIDRA) 5 % SOLN Apply 1 drop to eye 2 (two) times daily.   . Loratadine 10 MG CAPS Take by mouth.   . Multiple Vitamin (MULTIVITAMIN) capsule Take by mouth.   . non-metallic deodorant Jethro Poling) MISC Apply 1 application topically daily as needed.   . tamoxifen (NOLVADEX) 20 MG tablet Take 1 tablet by mouth daily. 12/26/2016: Plans to start July 1st.  . zolpidem (AMBIEN) 10 MG tablet Take 10 mg by mouth at bedtime as needed for sleep.   Marland Kitchen acetaminophen (TYLENOL) 325 MG tablet Take 650 mg by mouth every 6 (six) hours as needed.   . [DISCONTINUED] ondansetron (ZOFRAN) 4 MG tablet Take 1 tablet (4 mg total) by mouth every 8 (eight) hours as  needed for nausea or vomiting. (Patient not taking: Reported on 12/26/2016)   . [DISCONTINUED] traMADol (ULTRAM) 50 MG tablet Take 1-2 tablets (50-100 mg total) by mouth every 6 (six) hours as needed. (Patient not taking: Reported on 10/11/2016)    No facility-administered encounter medications on file as of 02/10/2017.      ONCOLOGIC FAMILY HISTORY:  Family History  Problem Relation Age of Onset  . Colon cancer Sister 83       deceased 2  . Melanoma Brother        on back; currently 59  . Ovarian cancer Maternal Aunt   . Colon cancer Paternal Grandmother 51       deceased  . Breast cancer Cousin 24       mat female cousin; daughter of unaffected mat aunt; deceased 90  . Prostate cancer Father        deceased 39       SOCIAL HISTORY:  Felicia Hampton is married and lives with her husband in [place], [place].   She denies any current or history of tobacco, alcohol,  or illicit drug use.     PHYSICAL EXAMINATION:  Vital Signs:   Vitals:   02/10/17 0824  BP: 105/64  Pulse: 74  Resp: 18  Temp: 98.1 F (36.7 C)   Filed Weights   02/10/17 0824  Weight: 177 lb 3.2 oz (80.4 kg)   General: Well-nourished, well-appearing female in no acute distress.  She is accompanied in clinic by her husband today.   HEENT: Head is normocephalic.  Pupils equal and reactive to light. Conjunctivae clear without exudate.  Sclerae anicteric. Oral mucosa is pink, moist.  Oropharynx is pink without lesions or erythema.  Lymph: No cervical, supraclavicular, or infraclavicular lymphadenopathy noted on palpation.  Cardiovascular: Regular rate and rhythm.Marland Kitchen Respiratory: Clear to auscultation bilaterally. Chest expansion symmetric; breathing non-labored.  GI: Abdomen soft and round; non-tender, non-distended. Bowel sounds normoactive.  GU: Deferred.  Neuro: No focal deficits. Steady gait.  Psych: Mood and affect normal and appropriate for situation.  Extremities: No edema. MSK: No focal spinal  tenderness to palpation.  Full range of motion in bilateral upper extremities Skin: Warm and dry.  LABORATORY DATA:  None for this visit.  DIAGNOSTIC IMAGING:  None for this visit.      ASSESSMENT AND PLAN:  Ms.. Hilgert is a pleasant 61 y.o. female with Stage IA left breast invasive ductal carcinoma, ER+/PR+/HER2-, diagnosed in 07/2016, treated with lumpectomy, adjuvant radiation therapy, and anti-estrogen therapy with Tamoxifen beginning in 11/2016.  She presents to the Survivorship Clinic for our initial meeting and routine follow-up post-completion of treatment for breast cancer.    1. Stage IA left breast cancer:  Ms. Keach is continuing to recover from definitive treatment for breast cancer. She will follow-up with her medical oncologist, Dr. Jana Hakim this month with history and physical exam per surveillance protocol.  She will continue her anti-estrogen therapy with Tamoxifen. Thus far, she is tolerating the Tamoxifen well, with exception of dry eyes (see below).  Today, a comprehensive survivorship care plan and treatment summary was reviewed with the patient today detailing her breast cancer diagnosis, treatment course, potential late/long-term effects of treatment, appropriate follow-up care with recommendations for the future, and patient education resources.  A copy of this summary, along with a letter will be sent to the patient's primary care provider via mail/fax/In Basket message after today's visit.    2. Dry eyes: She is being followed by ophthalmology about this.  She will restart the Tamoxifen next week.    3. Lymphedema Prevention: I counseled her regarding her lymphedema risk in detail today and gave her a script for a sleeve and glove as she has an upcoming flight to [place].    4. Bone health:  Given Ms. Cumbo's age/history of breast cancer she is at risk for bone demineralization. I counseled her on ways to improve her bone density, in addition to telling her that Tamoxifen  has a protective effect on the bones.  She was given education on specific activities to promote bone health.  I will defer to her PCP regarding bone density testing and management.  5. Cancer screening:  Due to Ms. Eriksen's history and her age, she should receive screening for skin cancers, colon cancer, and gynecologic cancers.  The information and recommendations are listed on the patient's comprehensive care plan/treatment summary and were reviewed in detail with the patient.    6. Health maintenance and wellness promotion: Ms. Friedly was encouraged to consume 5-7 servings of fruits and vegetables per day. We reviewed the "Nutrition Rainbow" handout,  as well as the handout "Take Control of Your Health and Reduce Your Cancer Risk" from the [employer].  She was also encouraged to engage in moderate to vigorous exercise for 30 minutes per day most days of the week. We discussed the [employer] fitness program, which is designed for cancer survivors to help them become more physically fit after cancer treatments.  She was instructed to limit her alcohol consumption and continue to abstain from tobacco use.     7. Support services/counseling: It is not uncommon for this period of the patient's cancer care trajectory to be one of many emotions and stressors.  We discussed an opportunity for her to participate in the next session of [facility] ("Finding Your New Normal") support group series designed for patients after they have completed treatment.   Ms. Mccaughey was encouraged to take advantage of our many other support services programs, support groups, and/or counseling in coping with her new life as a cancer survivor after completing anti-cancer treatment.  She was offered support today through active listening and expressive supportive counseling.  She was given information regarding our available services and encouraged to contact me with any questions or for help enrolling in any of our support  group/programs.    Dispo:   -Return to cancer center in August, 2018 -Mammogram due in 05/2017 -Follow up with surgery in September, 2018 -She is welcome to return back to the Survivorship Clinic at any time; no additional follow-up needed at this time.  -Consider referral back to survivorship as a long-term survivor for continued surveillance  A total of (50) minutes of face-to-face time was spent with this patient with greater than 50% of that time in counseling and care-coordination.   Gardenia Phlegm, NP Survivorship Program [place] [phone]   Note: PRIMARY CARE PROVIDER System, Pcp Not In None None

## 2017-02-10 NOTE — Telephone Encounter (Signed)
Spoke with patient concerning her scheduled  appointment at the [facility] on 11/24 at 10am

## 2017-02-14 ENCOUNTER — Encounter (HOSPITAL_COMMUNITY): Payer: Self-pay

## 2017-02-28 ENCOUNTER — Other Ambulatory Visit: Payer: Self-pay

## 2017-02-28 DIAGNOSIS — Z803 Family history of malignant neoplasm of breast: Secondary | ICD-10-CM

## 2017-03-01 ENCOUNTER — Other Ambulatory Visit (HOSPITAL_BASED_OUTPATIENT_CLINIC_OR_DEPARTMENT_OTHER): Payer: Managed Care, Other (non HMO)

## 2017-03-01 ENCOUNTER — Ambulatory Visit (HOSPITAL_BASED_OUTPATIENT_CLINIC_OR_DEPARTMENT_OTHER): Payer: Managed Care, Other (non HMO) | Admitting: Oncology

## 2017-03-01 VITALS — BP 113/67 | HR 83 | Temp 97.8°F | Resp 18 | Ht 62.0 in | Wt 179.1 lb

## 2017-03-01 DIAGNOSIS — C50212 Malignant neoplasm of upper-inner quadrant of left female breast: Secondary | ICD-10-CM

## 2017-03-01 DIAGNOSIS — N951 Menopausal and female climacteric states: Secondary | ICD-10-CM | POA: Diagnosis not present

## 2017-03-01 DIAGNOSIS — Z79810 Long term (current) use of selective estrogen receptor modulators (SERMs): Secondary | ICD-10-CM | POA: Diagnosis not present

## 2017-03-01 DIAGNOSIS — Z170 Estrogen receptor positive status [ER+]: Secondary | ICD-10-CM | POA: Diagnosis not present

## 2017-03-01 DIAGNOSIS — Z803 Family history of malignant neoplasm of breast: Secondary | ICD-10-CM

## 2017-03-01 LAB — CBC WITH DIFFERENTIAL/PLATELET
BASO%: 1.3 % (ref 0.0–2.0)
BASOS ABS: 0.1 10*3/uL (ref 0.0–0.1)
EOS%: 1.7 % (ref 0.0–7.0)
Eosinophils Absolute: 0.1 10*3/uL (ref 0.0–0.5)
HCT: 38.9 % (ref 34.8–46.6)
HGB: 13.2 g/dL (ref 11.6–15.9)
LYMPH%: 22.6 % (ref 14.0–49.7)
MCH: 28.7 pg (ref 25.1–34.0)
MCHC: 33.9 g/dL (ref 31.5–36.0)
MCV: 84.5 fL (ref 79.5–101.0)
MONO#: 0.4 10*3/uL (ref 0.1–0.9)
MONO%: 8.5 % (ref 0.0–14.0)
NEUT%: 65.9 % (ref 38.4–76.8)
NEUTROS ABS: 3.4 10*3/uL (ref 1.5–6.5)
Platelets: 263 10*3/uL (ref 145–400)
RBC: 4.6 10*6/uL (ref 3.70–5.45)
RDW: 12.9 % (ref 11.2–14.5)
WBC: 5.2 10*3/uL (ref 3.9–10.3)
lymph#: 1.2 10*3/uL (ref 0.9–3.3)

## 2017-03-01 LAB — COMPREHENSIVE METABOLIC PANEL
ALT: 25 U/L (ref 0–55)
AST: 26 U/L (ref 5–34)
Albumin: 3.6 g/dL (ref 3.5–5.0)
Alkaline Phosphatase: 81 U/L (ref 40–150)
Anion Gap: 8 mEq/L (ref 3–11)
BUN: 19.7 mg/dL (ref 7.0–26.0)
CHLORIDE: 104 meq/L (ref 98–109)
CO2: 30 mEq/L — ABNORMAL HIGH (ref 22–29)
Calcium: 10.4 mg/dL (ref 8.4–10.4)
Creatinine: 0.8 mg/dL (ref 0.6–1.1)
EGFR: 81 mL/min/{1.73_m2} — AB (ref >90)
GLUCOSE: 127 mg/dL (ref 70–140)
POTASSIUM: 4.1 meq/L (ref 3.5–5.1)
SODIUM: 142 meq/L (ref 136–145)
Total Bilirubin: 0.28 mg/dL (ref 0.20–1.20)
Total Protein: 6.9 g/dL (ref 6.4–8.3)

## 2017-03-01 MED ORDER — VENLAFAXINE HCL ER 75 MG PO CP24
75.0000 mg | ORAL_CAPSULE | Freq: Every day | ORAL | 12 refills | Status: DC
Start: 2017-03-01 — End: 2017-03-06

## 2017-03-01 NOTE — Progress Notes (Signed)
[place]  [phone] [phone]     ID: Felicia Hampton DOB: 04-20-1956  MR#: [id number]  YYT#:[id number]  Patient Care Team: Leamon Arnt, MD as PCP - General (Family Medicine) Valen Gillison, Virgie Dad, MD as Consulting Physician (Oncology) Rolm Bookbinder, MD as Consulting Physician (General Surgery) Maisie Fus, MD as Consulting Physician (Obstetrics and Gynecology) Irene Limbo, MD as Consulting Physician (Plastic Surgery) Causey, Charlestine Massed, NP as Nurse Practitioner (Hematology and Oncology) Chauncey Cruel, MD OTHER MD:  CHIEF COMPLAINT: Estrogen receptor positive breast cancer  CURRENT TREATMENT: Awaiting adjuvant radiation   BREAST CANCER HISTORY: From the original consult note:  Felicia Hampton had routine screening mammography at Dr. Verlon Au suggesting an abnormality in her left breast upper inner quadrant. Breast density was category B. She was referred for ultrasonography which did not identify the mass. It also found the axilla to be clear sonographically. Breast MRI 07/01/2016 found no abnormalities in the right breast. In the left breast central upper section there was a 0.8 cm enhancing mass associated with architectural distortion. There was a 6 cm area of non-masslike enhancement in the upper outer quadrant of the left breast 3 cm lateral to the other lesion. There were no abnormal appearing lymph nodes.  On 07/12/2016 she underwent MRI guided core biopsies of these 2 areas in question. The final pathology (SAA [id number]) showed in the upper inner quadrant, invasive ductal carcinoma, grade 1, estrogen receptor 100% positive, progesterone receptor 100% positive, both with strong staining intensity, with an MIB-1 of 2%, and no HER-2 amplification, the signals ratio being 1.89 and the number per cell 2.65. In the upper outer quadrant biopsy there was only atypical lobular hyperplasia.  The patient's subsequent history is as detailed  below  INTERVAL HISTORY: Felicia Hampton returns today for follow-up and treatment of her estrogen receptor positive breast cancer. Since her last visit here she completed her radiation treatments. She did have some fatigue and some skin changes but that has largely largely resolved. She is undergoing physical therapy with some improvement in her range of motion.  She went on tamoxifen i late June. She had problems with dry eye, went off a little, but now is back on it. She does have some hot flashes. She distantly At 3 AM with them. She has minimal vaginal wetness. Otherwise a detailed review of systems today was stable   REVIEW OF SYSTEMS: She exercises by walking 2-3 miles about 5 days a week, but for her, this is not acceptable. She intends to intensify her exercise program as tolerated. Otherwise a detailed review of systems today was stable  PAST MEDICAL HISTORY: Past Medical History:  Diagnosis Date  . Adult ADHD   . Cancer [facility]) 2018   left breast  . Family history of breast cancer   . Family history of colon cancer   . Family history of ovarian cancer   . Genetic testing 08/02/2016   Negative; No pathogenic mutations detected. STAT Breast panel with reflex to Common Cancers panel  Genes tested were the 43 genes on [facility] Common Cancers panel (APC, ATM, AXIN2, BARD1, BMPR1A, BRCA1, BRCA2, BRIP1, CDH1, CDKN2A, CHEK2, DICER1, EPCAM, GREM1, HOXB13, KIT, MEN1, MLH1, MSH2, MSH6, MUTYH, NBN, NF1, PALB2, PDGFRA, PMS2, POLD1, POLE, PTEN, RAD50, RAD51C, RAD51D, SDHA, SDHB, SDHC, SDHD  . History of radiation therapy 10/10/16-11/24/16   left breast 50.4 Gy in 28 fractions, left breast boost 12 Gy in 6 fractions  . Hypercholesteremia   . PONV (postoperative nausea and vomiting)  PAST SURGICAL HISTORY: Past Surgical History:  Procedure Laterality Date  . ABDOMINAL HYSTERECTOMY    . BREAST REDUCTION SURGERY Bilateral 08/23/2016   Procedure: BILATERAL ONCOPLASTIC BREAST REDUCTION;  Surgeon: Irene Limbo, MD;  Location: [facility];  Service: Plastics;  Laterality: Bilateral;  . KNEE ARTHROSCOPY     x3  . RADIOACTIVE SEED GUIDED MASTECTOMY WITH AXILLARY SENTINEL LYMPH NODE BIOPSY Left 08/11/2016   Procedure: RADIOACTIVE SEED GUIDED LEFT BREAST LUMPECTOMY WITH AXILLARY SENTINEL LYMPH NODE BIOPSY;  Surgeon: Rolm Bookbinder, MD;  Location: [facility];  Service: General;  Laterality: Left;  . SHOULDER ARTHROSCOPY W/ ROTATOR CUFF REPAIR Right     FAMILY HISTORY Family History  Problem Relation Age of Onset  . Colon cancer Sister 22       deceased 36  . Melanoma Brother        on back; currently 82  . Ovarian cancer Maternal Aunt   . Colon cancer Paternal Grandmother 36       deceased  . Breast cancer Cousin 36       mat female cousin; daughter of unaffected mat aunt; deceased 64  . Prostate cancer Father        deceased 14  The patient's father died from prostate cancer at age 11. The patient's mother died from heart disease at age 55. The patient had 5 brothers, 6 sisters. A twin sister died at the age of 37 from colon cancer. A maternal cousin had breast cancer at age 67. A maternal aunt had ovarian cancer, age at diagnosis unknown. Another maternal aunt had kidney cancer. On the father's side A grandmother died at age 68 with colon cancer  GYNECOLOGIC HISTORY:  No LMP recorded. Patient is postmenopausal. Menarche age 41, first live birth age 89. The patient is GX P1. She Underwent hysterectomy in her late 5s and was on hormone replacement for approximately 10 years, stopping approximately 2016  SOCIAL HISTORY:  Felicia Hampton has an interesting athletic history and she swam the [place] channel to [place] and also did other long-term swims in her youth. She is originally from [place]. 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 He has children of his. The patient's son Felicia Hampton  lives in [place] and is in Transport planner. The patient has 1 biological granddaughter, 59 months old as of January 2018.    ADVANCED DIRECTIVES: Not in place   HEALTH MAINTENANCE: Social History  Substance Use Topics  . Smoking status: Former Smoker    Packs/day: 0.50    Years: 10.00    Quit date: 08/02/1988  . Smokeless tobacco: Never Used  . Alcohol use Yes     Comment: social     Colonoscopy: July 2016  PAP: 2016  Bone density: 2015   Allergies  Allergen Reactions  . Ace Inhibitors Palpitations and Shortness Of Breath    COX-2 inhibitors  . Diclofenac     Other reaction(s): Hypotension (ALLERGY/intolerance)  . Morphine Nausea And Vomiting  . Morphine And Related Nausea And Vomiting  . Oxycodone Other (See Comments)    hallucinations    Current Outpatient Prescriptions  Medication Sig Dispense Refill  . acetaminophen (TYLENOL) 325 MG tablet Take 650 mg by mouth every 6 (six) hours as needed.    Marland Kitchen amphetamine-dextroamphetamine (ADDERALL) 10 MG tablet Take 10 mg by mouth 3 (three) times a week.    Marland Kitchen atorvastatin (LIPITOR) 20 MG tablet Take 20  mg by mouth daily at 6 PM.    . Cholecalciferol (VITAMIN D) 2000 units tablet Take by mouth.    . fluticasone (FLONASE) 50 MCG/ACT nasal spray Place into the nose.    . hyaluronate sodium (RADIAPLEXRX) GEL Apply 1 application topically 2 (two) times daily.    Marland Kitchen Lifitegrast (XIIDRA) 5 % SOLN Apply 1 drop to eye 2 (two) times daily.    . Loratadine 10 MG CAPS Take by mouth.    . Multiple Vitamin (MULTIVITAMIN) capsule Take by mouth.    . non-metallic deodorant Jethro Poling) MISC Apply 1 application topically daily as needed.    . tamoxifen (NOLVADEX) 20 MG tablet Take 1 tablet by mouth daily.  0  . zolpidem (AMBIEN) 10 MG tablet Take 10 mg by mouth at bedtime as needed for sleep.     No current facility-administered medications for this visit.     OBJECTIVE: Middle-aged white womanWho appears well  Vitals:   03/01/17 1447  BP:  113/67  Pulse: 83  Resp: 18  Temp: 97.8 F (36.6 C)  SpO2: 98%     Body mass index is 32.76 kg/m.    ECOG FS:0 - Asymptomatic  Sclerae unicteric, pupils round and equal Oropharynx clear and moist No cervical or supraclavicular adenopathy Lungs no rales or rhonchi Heart regular rate and rhythm Abd soft, nontender, positive bowel sounds MSK no focal spinal tenderness, no upper extremity lymphedema Neuro: nonfocal, well oriented, appropriate affect Breasts: Status post bilateral reduction mammoplasty. The left breast is also status post lumpectomy and radiation. There is no evidence local recurrence. Both axillae are benign.   09/20/2016  Right lower leg lesions    LAB RESULTS:  CMP     Component Value Date/Time   NA 142 03/01/2017 1405   K 4.1 03/01/2017 1405   CO2 30 (H) 03/01/2017 1405   GLUCOSE 127 03/01/2017 1405   BUN 19.7 03/01/2017 1405   CREATININE 0.8 03/01/2017 1405   CALCIUM 10.4 03/01/2017 1405   PROT 6.9 03/01/2017 1405   ALBUMIN 3.6 03/01/2017 1405   AST 26 03/01/2017 1405   ALT 25 03/01/2017 1405   ALKPHOS 81 03/01/2017 1405   BILITOT 0.28 03/01/2017 1405    INo results found for: SPEP, UPEP  Lab Results  Component Value Date   WBC 5.2 03/01/2017   NEUTROABS 3.4 03/01/2017   HGB 13.2 03/01/2017   HCT 38.9 03/01/2017   MCV 84.5 03/01/2017   PLT 263 03/01/2017      Chemistry      Component Value Date/Time   NA 142 03/01/2017 1405   K 4.1 03/01/2017 1405   CO2 30 (H) 03/01/2017 1405   BUN 19.7 03/01/2017 1405   CREATININE 0.8 03/01/2017 1405      Component Value Date/Time   CALCIUM 10.4 03/01/2017 1405   ALKPHOS 81 03/01/2017 1405   AST 26 03/01/2017 1405   ALT 25 03/01/2017 1405   BILITOT 0.28 03/01/2017 1405       No results found for: LABCA2  No components found for: LABCA125  No results for input(s): INR in the last 168 hours.  Urinalysis No results found for: COLORURINE, APPEARANCEUR, LABSPEC, PHURINE, GLUCOSEU, HGBUR,  BILIRUBINUR, KETONESUR, PROTEINUR, UROBILINOGEN, NITRITE, LEUKOCYTESUR   STUDIES: No results found.  ELIGIBLE FOR AVAILABLE RESEARCH PROTOCOL: no  ASSESSMENT: 61 y.o. [place] woman status post left breast upper inner quadrant biopsy 07/12/2016 for a clinical T1b N0, stage IA invasive ductal carcinoma, grade 1 estrogen and progesterone receptor strongly positive, HER-2  not amplified, with an MIB-1 of 2%  (a) biopsy of an area of non-masslike enhancement in the upper outer quadrant 07/12/2016 showed atypical lobular hyperplasia  (1) genetics testing through the [facility]'s STAT breast panel 07/21/2016 found no deleterious mutations in ATM, BRCA1, BRCA2, CDH1, CHEK2, PALB2, PTEN, STK11, TP53. There were also no deleterious mutations in the 43 genes on [facility]'s Common Cancers panel (APC, ATM, AXIN2, BARD1, BMPR1A, BRCA1, BRCA2, BRIP1, CDH1, CDKN2A, CHEK2, DICER1, EPCAM, GREM1, HOXB13, KIT, MEN1, MLH1, MSH2, MSH6, MUTYH, NBN, NF1, PALB2, PDGFRA, PMS2, POLD1, POLE, PTEN, RAD50, RAD51C, RAD51D, SDHA, SDHB, SDHC, SDHD, SMAD4, SMARCA4, STK11, TP53, TSC1, TSC2, VHL  (2)  Status post left lumpectomy and sentinel lymph node sampling 08/11/2016 for a pT1a pN0, stage IA  Invasive ductal carcinoma, grade 1, repeat HER-2 testing again negative  (a)   Pathology from bilateral mammoplastic surgery 08/23/2016 was benign (including lumpectomy site)  (3) given the l tumor size, low grade and low proliferation fraction, chemotherapy is not indicated  (4) adjuvant radiation 10/10/16 - 11/24/16 : Left Breast treated to 50.4 Gy in 28 fractions, then Boosted an additional 12 Gy in 6 fractions  (5) started tamoxifen in late June 2018  PLAN: I think the fatigue Christna is experiencing is going to be residual from the radiation treatments. Tamoxifen generally does not cause that problem although of course it is possible that it might.  She is having significant hot flashes. We discussed gabapentin and venlafaxine in  detail. I'm calling in venlafaxine for her. I'm going to see her in 3 months just to make sure everything is on the upper and up.  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 She is going to apply at [facility]  She knows to call for any problems that may develop before the next visit here.    Chauncey Cruel, MD   03/01/2017 3:04 PM Medical Oncology and Hematology [facility] [address] [place], [place] [address] Tel. [phone]    Fax. [phone]

## 2017-03-06 ENCOUNTER — Other Ambulatory Visit: Payer: Self-pay | Admitting: *Deleted

## 2017-03-06 ENCOUNTER — Telehealth: Payer: Self-pay | Admitting: *Deleted

## 2017-03-06 MED ORDER — VENLAFAXINE HCL ER 75 MG PO CP24: 75.0000 mg | ORAL_CAPSULE | Freq: Every day | ORAL | 12 refills | Status: DC

## 2017-03-06 NOTE — Telephone Encounter (Signed)
Sent effexor order to pharmacy

## 2017-04-28 NOTE — Telephone Encounter (Signed)
No entry 

## 2017-05-30 NOTE — Progress Notes (Signed)
[place] [facility]  [phone] [phone]     ID: Felicia Hampton DOB: April 13, 1956  MR#: [id number]  HYI#:[id number]  Patient Care Team: Leamon Arnt, MD as PCP - General (Family Medicine) Eniya Cannady, Virgie Dad, MD as Consulting Physician (Oncology) Rolm Bookbinder, MD as Consulting Physician (General Surgery) Maisie Fus, MD as Consulting Physician (Obstetrics and Gynecology) Irene Limbo, MD as Consulting Physician (Plastic Surgery) Delice Bison, Charlestine Massed, NP as Nurse Practitioner (Hematology and Oncology) OTHER MD:  CHIEF COMPLAINT: Estrogen receptor positive breast cancer  CURRENT TREATMENT: Tamoxifen   BREAST CANCER HISTORY: From the original consult note:  Cora had routine screening mammography at Dr. Verlon Au suggesting an abnormality in her left breast upper inner quadrant. Breast density was category B. She was referred for ultrasonography which did not identify the mass. It also found the axilla to be clear sonographically. Breast MRI 07/01/2016 found no abnormalities in the right breast. In the left breast central upper section there was a 0.8 cm enhancing mass associated with architectural distortion. There was a 6 cm area of non-masslike enhancement in the upper outer quadrant of the left breast 3 cm lateral to the other lesion. There were no abnormal appearing lymph nodes.  On 07/12/2016 she underwent MRI guided core biopsies of these 2 areas in question. The final pathology (SAA [id number]) showed in the upper inner quadrant, invasive ductal carcinoma, grade 1, estrogen receptor 100% positive, progesterone receptor 100% positive, both with strong staining intensity, with an MIB-1 of 2%, and no HER-2 amplification, the signals ratio being 1.89 and the number per cell 2.65. In the upper outer quadrant biopsy there was only atypical lobular hyperplasia.  The patient's subsequent history is as detailed below  INTERVAL HISTORY: [name] returns  today for follow-up and treatment of her estrogen receptor positive breast cancer.  She continues on tamoxifen, with good tolerance. She reports that she get hot flashes that are mildly intense around the early and late afternoon hours. She notes that some last a few minutes while others last about 30 minutes. She notes that the more intense hot flashes cause her to sweat until her hair is saturated. She denies night sweats. She notes that she got a new Sleep Number bed that has improved her sleeping. She denies an increase in  vaginal wetness.  Since her last visit, she underwent diagnostic bilateral mammography and tomography on 06/05/2017 at [facility] with results showing breast density category C. There was no evidence of malignancy.  REVIEW OF SYSTEMS: [name] reports some tightness in her left under arm has extended down the left arm. She still completes ROM exercises. For exercise, she was walking a total of 14-16 miles per week, but her walking has decreased due to the cold weather. She reports that her family will be visiting her from [place] soon. She denies unusual headaches, visual changes, nausea, vomiting, or dizziness. There has been no unusual cough, phlegm production, or pleurisy. This been no change in bowel or bladder habits. She denies unexplained fatigue or unexplained weight loss, bleeding, rash, or fever. A detailed review of systems was otherwise stable.   PAST MEDICAL HISTORY: Past Medical History:  Diagnosis Date  . Adult ADHD   . Cancer [facility]) 2018   left breast  . Family history of breast cancer   . Family history of colon cancer   . Family history of ovarian cancer   . Genetic testing 08/02/2016   Negative; No pathogenic mutations detected. STAT Breast panel  with reflex to Common Cancers panel  Genes tested were the 43 genes on [facility]'s Common Cancers panel (APC, ATM, AXIN2, BARD1, BMPR1A, BRCA1, BRCA2, BRIP1, CDH1, CDKN2A, CHEK2, DICER1, EPCAM, GREM1, HOXB13,  KIT, MEN1, MLH1, MSH2, MSH6, MUTYH, NBN, NF1, PALB2, PDGFRA, PMS2, POLD1, POLE, PTEN, RAD50, RAD51C, RAD51D, SDHA, SDHB, SDHC, SDHD  . History of radiation therapy 10/10/16-11/24/16   left breast 50.4 Gy in 28 fractions, left breast boost 12 Gy in 6 fractions  . Hypercholesteremia   . PONV (postoperative nausea and vomiting)     PAST SURGICAL HISTORY: Past Surgical History:  Procedure Laterality Date  . ABDOMINAL HYSTERECTOMY    . BREAST REDUCTION SURGERY Bilateral 08/23/2016   Procedure: BILATERAL ONCOPLASTIC BREAST REDUCTION;  Surgeon: Irene Limbo, MD;  Location: [facility];  Service: Plastics;  Laterality: Bilateral;  . KNEE ARTHROSCOPY     x3  . RADIOACTIVE SEED GUIDED PARTIAL MASTECTOMY WITH AXILLARY SENTINEL LYMPH NODE BIOPSY Left 08/11/2016   Procedure: RADIOACTIVE SEED GUIDED LEFT BREAST LUMPECTOMY WITH AXILLARY SENTINEL LYMPH NODE BIOPSY;  Surgeon: Rolm Bookbinder, MD;  Location: [facility];  Service: General;  Laterality: Left;  . SHOULDER ARTHROSCOPY W/ ROTATOR CUFF REPAIR Right     FAMILY HISTORY Family History  Problem Relation Age of Onset  . Colon cancer Sister 98       deceased 5  . Melanoma Brother        on back; currently 63  . Ovarian cancer Maternal Aunt   . Colon cancer Paternal Grandmother 52       deceased  . Breast cancer Cousin 57       mat female cousin; daughter of unaffected mat aunt; deceased 34  . Prostate cancer Father        deceased 36  The patient's father died from prostate cancer at age 75. The patient's mother died from heart disease at age 47. The patient had 5 brothers, 6 sisters. A twin sister died at the age of 35 from colon cancer. A maternal cousin had breast cancer at age 51. A maternal aunt had ovarian cancer, age at diagnosis unknown. Another maternal aunt had kidney cancer. On the father's side A grandmother died at age 8 with colon cancer  GYNECOLOGIC HISTORY:  No LMP recorded. Patient is  postmenopausal. Menarche age 16, first live birth age 61. The patient is GX P1. She Underwent hysterectomy in her late 3s and was on hormone replacement for approximately 10 years, stopping approximately 2016  SOCIAL HISTORY:  Taylinn has an interesting athletic history and she swam the [place] channel to [place] and also did other long-term swims in her youth. She is originally from [place]. 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 He has children of his. The patient's son Rolla Plate lives in [place] and is in Transport planner. The patient has 1 biological granddaughter, 26 months old as of January 2018.    ADVANCED DIRECTIVES: Not in place   HEALTH MAINTENANCE: Social History   Tobacco Use  . Smoking status: Former Smoker    Packs/day: 0.50    Years: 10.00    Pack years: 5.00    Last attempt to quit: 08/02/1988    Years since quitting: 28.8  . Smokeless tobacco: Never Used  Substance Use Topics  . Alcohol use: Yes    Comment: social  . Drug use: No     Colonoscopy: July 2016  PAP: 2016  Bone density: 2015   Allergies  Allergen Reactions  . Ace Inhibitors Palpitations and Shortness Of Breath    COX-2 inhibitors  . Diclofenac     Other reaction(s): Hypotension (ALLERGY/intolerance)  . Morphine Nausea And Vomiting  . Morphine And Related Nausea And Vomiting  . Oxycodone Other (See Comments)    hallucinations    Current Outpatient Medications  Medication Sig Dispense Refill  . acetaminophen (TYLENOL) 325 MG tablet Take 650 mg by mouth every 6 (six) hours as needed.    Marland Kitchen amphetamine-dextroamphetamine (ADDERALL) 10 MG tablet Take 10 mg by mouth 3 (three) times a week.    Marland Kitchen atorvastatin (LIPITOR) 20 MG tablet Take 20 mg by mouth daily at 6 PM.    . Cholecalciferol (VITAMIN D) 2000 units tablet Take by mouth.    . fluticasone (FLONASE) 50 MCG/ACT nasal spray Place into the nose.    Marland Kitchen Lifitegrast  (XIIDRA) 5 % SOLN Apply 1 drop to eye 2 (two) times daily.    . Loratadine 10 MG CAPS Take by mouth.    . Multiple Vitamin (MULTIVITAMIN) capsule Take by mouth.    . tamoxifen (NOLVADEX) 20 MG tablet Take 1 tablet by mouth daily.  0  . venlafaxine XR (EFFEXOR-XR) 75 MG 24 hr capsule Take 1 capsule (75 mg total) by mouth daily with breakfast. 30 capsule 12  . zolpidem (AMBIEN) 10 MG tablet Take 10 mg by mouth at bedtime as needed for sleep.     No current facility-administered medications for this visit.     OBJECTIVE: Middle-aged white woman in no acute distress  Vitals:   06/07/17 1424  BP: 128/84  Pulse: 71  Resp: 20  Temp: 98.3 F (36.8 C)  SpO2: 98%     Body mass index is 31.99 kg/m.    ECOG FS:1 - Symptomatic but completely ambulatory  Sclerae unicteric, EOMs intact Oropharynx clear and moist No cervical or supraclavicular adenopathy Lungs no rales or rhonchi Heart regular rate and rhythm Abd soft, nontender, positive bowel sounds MSK no focal spinal tenderness, no upper extremity lymphedema Neuro: nonfocal, well oriented, appropriate affect Breasts: She has undergone bilateral reduction mammoplasty and in addition left lumpectomy and radiation.  The cosmetic result is excellent.  She has good extension in the left upper extremity, no erythema, and no swelling.  Both axillae are benign.    09/20/2016  Right lower leg lesions    LAB RESULTS:  CMP     Component Value Date/Time   NA 139 06/07/2017 1344   K 3.7 06/07/2017 1344   CO2 26 06/07/2017 1344   GLUCOSE 79 06/07/2017 1344   BUN 13.2 06/07/2017 1344   CREATININE 0.7 06/07/2017 1344   CALCIUM 9.1 06/07/2017 1344   PROT 6.7 06/07/2017 1344   ALBUMIN 3.7 06/07/2017 1344   AST 21 06/07/2017 1344   ALT 20 06/07/2017 1344   ALKPHOS 64 06/07/2017 1344   BILITOT 0.29 06/07/2017 1344    INo results found for: SPEP, UPEP  Lab Results  Component Value Date   WBC 5.5 06/07/2017   NEUTROABS 3.3 06/07/2017    HGB 12.8 06/07/2017   HCT 38.6 06/07/2017   MCV 85.6 06/07/2017   PLT 249 06/07/2017      Chemistry      Component Value Date/Time   NA 139 06/07/2017 1344   K 3.7 06/07/2017 1344   CO2 26 06/07/2017 1344   BUN 13.2 06/07/2017 1344   CREATININE 0.7 06/07/2017 1344  Component Value Date/Time   CALCIUM 9.1 06/07/2017 1344   ALKPHOS 64 06/07/2017 1344   AST 21 06/07/2017 1344   ALT 20 06/07/2017 1344   BILITOT 0.29 06/07/2017 1344       No results found for: LABCA2  No components found for: [id number]  No results for input(s): INR in the last 168 hours.  Urinalysis No results found for: COLORURINE, APPEARANCEUR, LABSPEC, PHURINE, GLUCOSEU, HGBUR, BILIRUBINUR, KETONESUR, PROTEINUR, UROBILINOGEN, NITRITE, LEUKOCYTESUR   STUDIES: Since her last visit, she underwent diagnostic bilateral mammography and tomography on 06/05/2017 at [facility] with results showing breast density category C. There was no evidence of malignancy.  ELIGIBLE FOR AVAILABLE RESEARCH PROTOCOL: no  ASSESSMENT: 61 y.o. Millen woman status post left breast upper inner quadrant biopsy 07/12/2016 for a clinical T1b N0, stage IA invasive ductal carcinoma, grade 1 estrogen and progesterone receptor strongly positive, HER-2 not amplified, with an MIB-1 of 2%  (a) biopsy of an area of non-masslike enhancement in the upper outer quadrant 07/12/2016 showed atypical lobular hyperplasia  (1) genetics testing through the [facility]'s STAT breast panel 07/21/2016 found no deleterious mutations in ATM, BRCA1, BRCA2, CDH1, CHEK2, PALB2, PTEN, STK11, TP53. There were also no deleterious mutations in the 43 genes on [facility]'s Common Cancers panel (APC, ATM, AXIN2, BARD1, BMPR1A, BRCA1, BRCA2, BRIP1, CDH1, CDKN2A, CHEK2, DICER1, EPCAM, GREM1, HOXB13, KIT, MEN1, MLH1, MSH2, MSH6, MUTYH, NBN, NF1, PALB2, PDGFRA, PMS2, POLD1, POLE, PTEN, RAD50, RAD51C, RAD51D, SDHA, SDHB, SDHC, SDHD, SMAD4, SMARCA4, STK11, TP53, TSC1,  TSC2, VHL  (2)  Status post left lumpectomy and sentinel lymph node sampling 08/11/2016 for a pT1a pN0, stage IA  Invasive ductal carcinoma, grade 1, repeat HER-2 testing again negative  (a)   Pathology from bilateral mammoplastic surgery 08/23/2016 was benign (including lumpectomy site)  (3) given the l tumor size, low grade and low proliferation fraction, chemotherapy is not indicated  (4) adjuvant radiation 10/10/16 - 11/24/16 : Left Breast treated to 50.4 Gy in 28 fractions, then Boosted an additional 12 Gy in 6 fractions  (5) started tamoxifen in late June 2018  PLAN: Krysta is tolerating tamoxifen well and the plan will be to continue that for a total of 5 years.  She is having problems with hot flashes.  She is benefiting from venlafaxine at 75 mg.  She might do better with 150 mg.  I have asked her to take 2 tablets daily for the next week or 10 days and let us know if that helps and if it does and she has no side effects we will send her at 150 mg prescription.  We discussed TTS 1 but she is concerned her blood pressure is already a little bit on the low side  She is going to see me again in 6 months.  She knows to call for any problems that may develop before that visit.  Laruen Risser, Virgie Dad, MD  06/07/17 2:50 PM Medical Oncology and Hematology [facility] [address] [place], [place] [address] Tel. [phone]    Fax. [phone]  This document serves as a record of services personally performed by Lurline Del, MD. It was created on his behalf by Sheron Nightingale, a trained medical scribe. The creation of this record is based on the scribe's personal observations and the provider's statements to them.   I have reviewed the above documentation for accuracy and completeness, and I agree with the above.

## 2017-06-05 ENCOUNTER — Ambulatory Visit
Admission: RE | Admit: 2017-06-05 | Discharge: 2017-06-05 | Disposition: A | Discharge status: Home / Self Care | Payer: Managed Care, Other (non HMO) | Source: Ambulatory Visit | Attending: Adult Health | Admitting: Adult Health

## 2017-06-05 DIAGNOSIS — Z170 Estrogen receptor positive status [ER+]: Principal | ICD-10-CM

## 2017-06-05 DIAGNOSIS — C50212 Malignant neoplasm of upper-inner quadrant of left female breast: Secondary | ICD-10-CM

## 2017-06-06 ENCOUNTER — Other Ambulatory Visit: Payer: Self-pay

## 2017-06-06 DIAGNOSIS — Z170 Estrogen receptor positive status [ER+]: Principal | ICD-10-CM

## 2017-06-06 DIAGNOSIS — C50212 Malignant neoplasm of upper-inner quadrant of left female breast: Secondary | ICD-10-CM

## 2017-06-07 ENCOUNTER — Ambulatory Visit (HOSPITAL_BASED_OUTPATIENT_CLINIC_OR_DEPARTMENT_OTHER): Payer: Managed Care, Other (non HMO) | Admitting: Oncology

## 2017-06-07 ENCOUNTER — Other Ambulatory Visit (HOSPITAL_BASED_OUTPATIENT_CLINIC_OR_DEPARTMENT_OTHER): Payer: Managed Care, Other (non HMO)

## 2017-06-07 VITALS — BP 128/84 | HR 71 | Temp 98.3°F | Resp 20 | Ht 62.0 in | Wt 174.9 lb

## 2017-06-07 DIAGNOSIS — N951 Menopausal and female climacteric states: Secondary | ICD-10-CM | POA: Diagnosis not present

## 2017-06-07 DIAGNOSIS — C50212 Malignant neoplasm of upper-inner quadrant of left female breast: Secondary | ICD-10-CM | POA: Diagnosis not present

## 2017-06-07 DIAGNOSIS — Z170 Estrogen receptor positive status [ER+]: Principal | ICD-10-CM

## 2017-06-07 DIAGNOSIS — Z79810 Long term (current) use of selective estrogen receptor modulators (SERMs): Secondary | ICD-10-CM | POA: Diagnosis not present

## 2017-06-07 LAB — CBC WITH DIFFERENTIAL/PLATELET
BASO%: 1.2 % (ref 0.0–2.0)
Basophils Absolute: 0.1 10*3/uL (ref 0.0–0.1)
EOS%: 1.8 % (ref 0.0–7.0)
Eosinophils Absolute: 0.1 10*3/uL (ref 0.0–0.5)
HEMATOCRIT: 38.6 % (ref 34.8–46.6)
HGB: 12.8 g/dL (ref 11.6–15.9)
LYMPH#: 1.6 10*3/uL (ref 0.9–3.3)
LYMPH%: 28.4 % (ref 14.0–49.7)
MCH: 28.5 pg (ref 25.1–34.0)
MCHC: 33.3 g/dL (ref 31.5–36.0)
MCV: 85.6 fL (ref 79.5–101.0)
MONO#: 0.4 10*3/uL (ref 0.1–0.9)
MONO%: 7.8 % (ref 0.0–14.0)
NEUT#: 3.3 10*3/uL (ref 1.5–6.5)
NEUT%: 60.8 % (ref 38.4–76.8)
Platelets: 249 10*3/uL (ref 145–400)
RBC: 4.51 10*6/uL (ref 3.70–5.45)
RDW: 12.7 % (ref 11.2–14.5)
WBC: 5.5 10*3/uL (ref 3.9–10.3)

## 2017-06-07 LAB — COMPREHENSIVE METABOLIC PANEL
ALK PHOS: 64 U/L (ref 40–150)
ALT: 20 U/L (ref 0–55)
AST: 21 U/L (ref 5–34)
Albumin: 3.7 g/dL (ref 3.5–5.0)
Anion Gap: 8 mEq/L (ref 3–11)
BUN: 13.2 mg/dL (ref 7.0–26.0)
CALCIUM: 9.1 mg/dL (ref 8.4–10.4)
CHLORIDE: 105 meq/L (ref 98–109)
CO2: 26 meq/L (ref 22–29)
Creatinine: 0.7 mg/dL (ref 0.6–1.1)
EGFR: >60 mL/min/{1.73_m2} (ref >60)
Glucose: 79 mg/dl (ref 70–140)
Potassium: 3.7 mEq/L (ref 3.5–5.1)
Sodium: 139 mEq/L (ref 136–145)
TOTAL PROTEIN: 6.7 g/dL (ref 6.4–8.3)
Total Bilirubin: 0.29 mg/dL (ref 0.20–1.20)

## 2017-06-07 LAB — DRAW EXTRA CLOT TUBE

## 2017-06-08 ENCOUNTER — Telehealth: Payer: Self-pay | Admitting: Oncology

## 2017-06-08 NOTE — Telephone Encounter (Signed)
Scheduled appt per 11/28 los - lab and f/u in 6 months - reminder letter sent in the mail.

## 2017-06-27 ENCOUNTER — Other Ambulatory Visit: Payer: Self-pay

## 2017-06-29 ENCOUNTER — Other Ambulatory Visit: Payer: Self-pay | Admitting: *Deleted

## 2017-06-29 MED ORDER — VENLAFAXINE HCL ER 75 MG PO CP24: 150.0000 mg | ORAL_CAPSULE | Freq: Every day | ORAL | 12 refills | Status: DC

## 2017-07-10 ENCOUNTER — Other Ambulatory Visit: Payer: Self-pay | Admitting: *Deleted

## 2017-07-10 MED ORDER — VENLAFAXINE HCL ER 75 MG PO CP24: 150.0000 mg | ORAL_CAPSULE | Freq: Every day | ORAL | 12 refills | Status: DC

## 2017-08-24 IMAGING — MG MM PLC BREAST LOC DEV EA ADD LESION  INC MAMMO GUIDE*L*
8 of 9 series · 8 of 9 positions shown · non-contrast
Comparison: Previous exam(s).

CLINICAL DATA: 60-year-old with biopsy proven invasive ductal
carcinoma, atypical lobular hyperplasia and complex sclerosing
lesion involving the upper inner quadrant of the left breast marked
with a dumbbell-shaped tissue marker clip, and biopsy-proven
atypical lobular hyperplasia involving the upper outer quadrant of
the left breast marked with a cylinder shaped tissue marker clip. MR
guided biopsy of the anterior extent of non mass enhancement in the
upper outer quadrant performed 2 days ago revealed benign
fibrocystic changes and calcifications. Radioactive seed
localization of the invasive ductal carcinoma in the upper inner
quadrant and the atypical lobular hyperplasia in the upper outer
quadrant at posterior depth is performed in anticipation of
lumpectomies to be performed later today.

EXAM:
MAMMOGRAPHIC GUIDED RADIOACTIVE SEED LOCALIZATION OF THE LEFT BREAST
x 2

[L CC (1 of 3)]
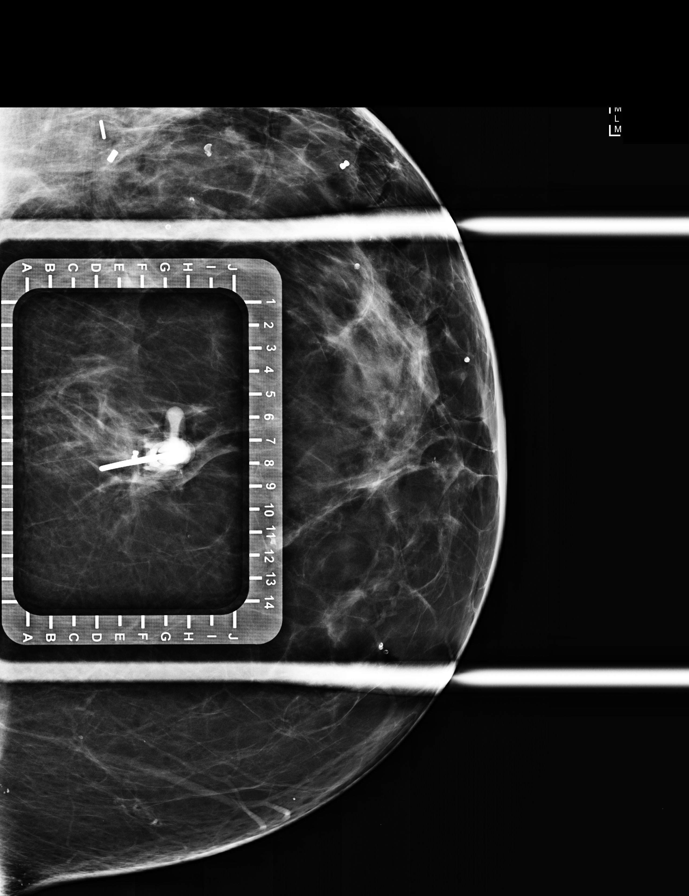

[L ML (1 of 5)]
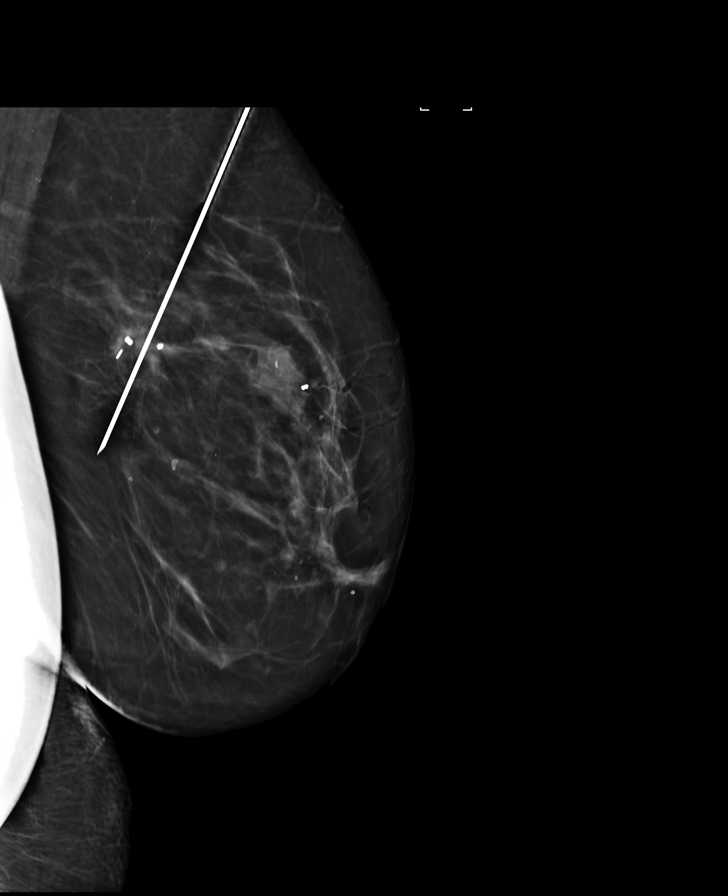

[L ML (2 of 5)]
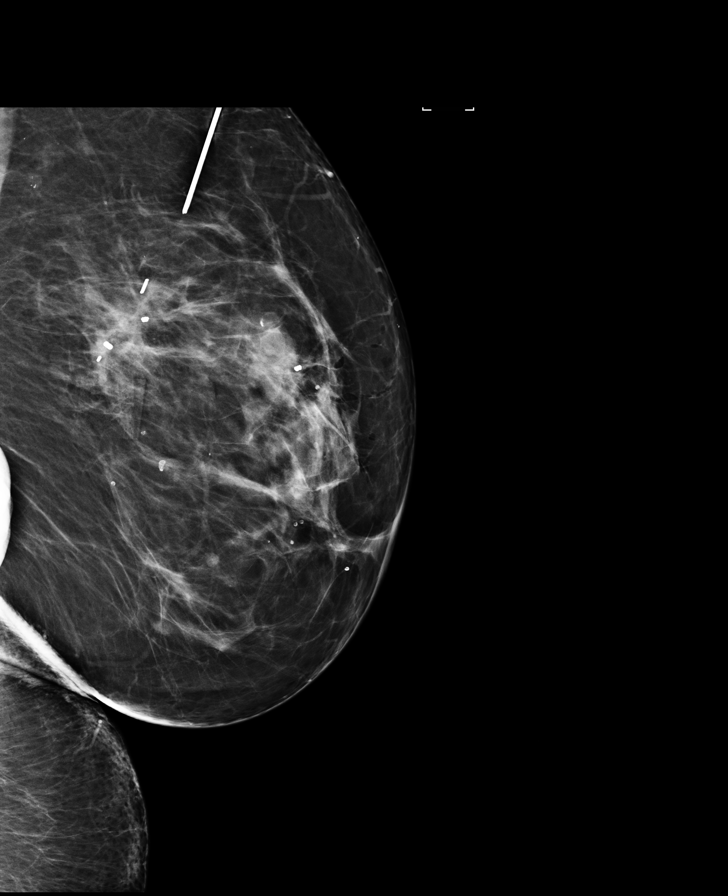

[L ML (3 of 5)]
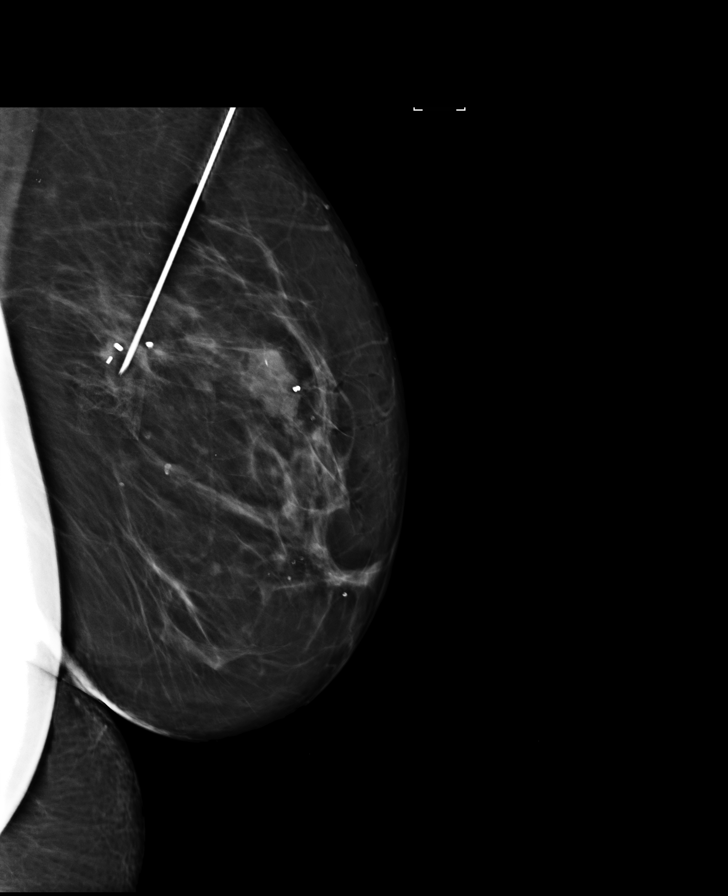

[L CC (2 of 3)]
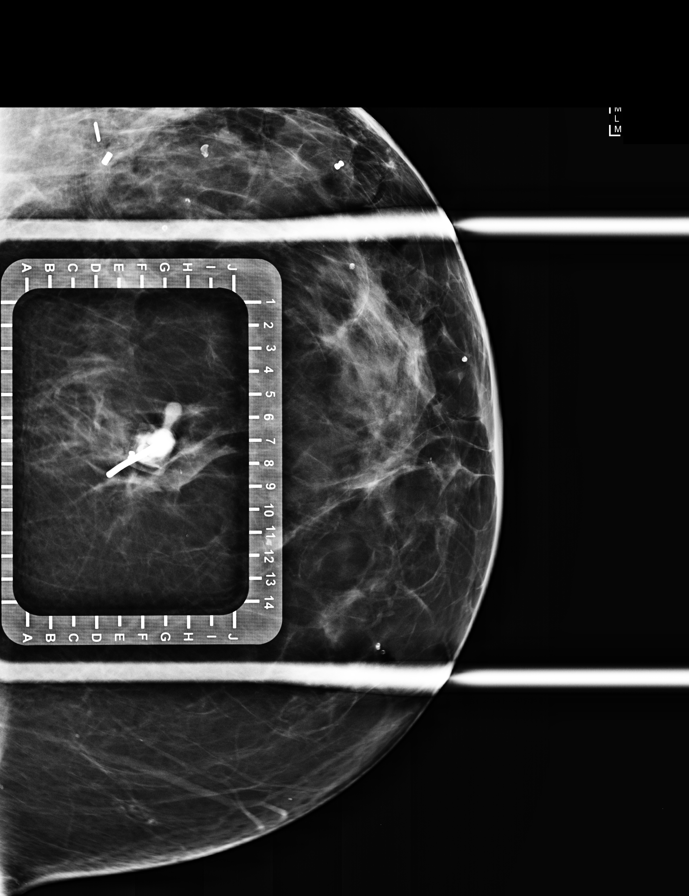

[L ML (4 of 5)]
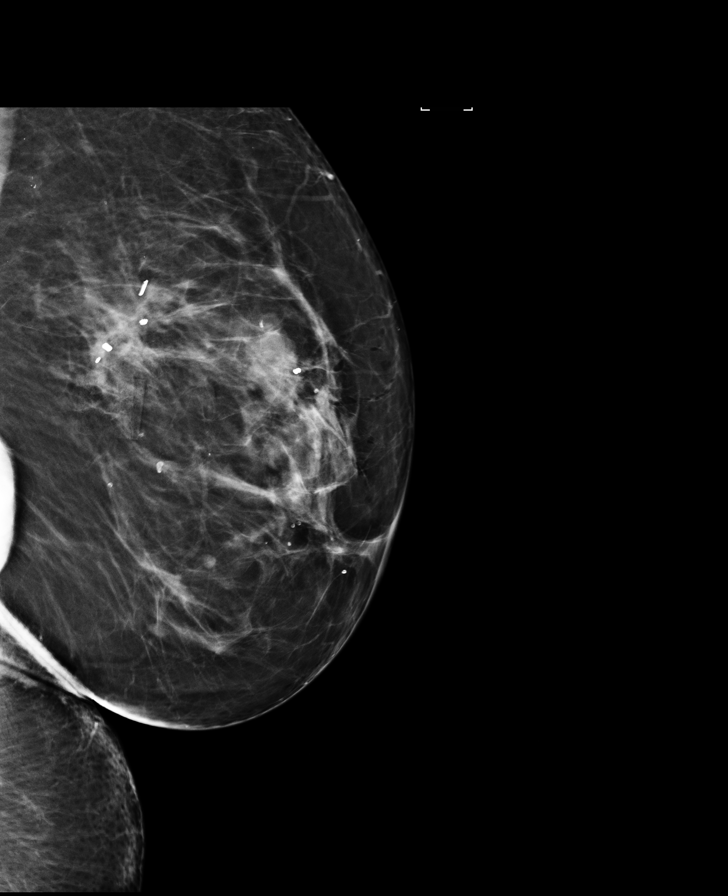

[L CC (3 of 3)]
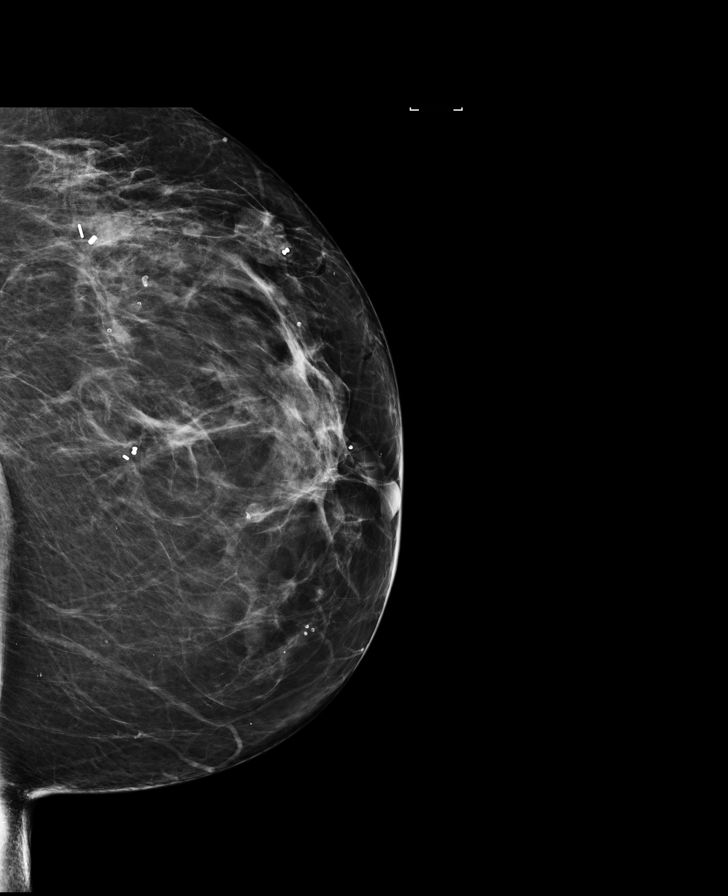

[L ML (5 of 5)]
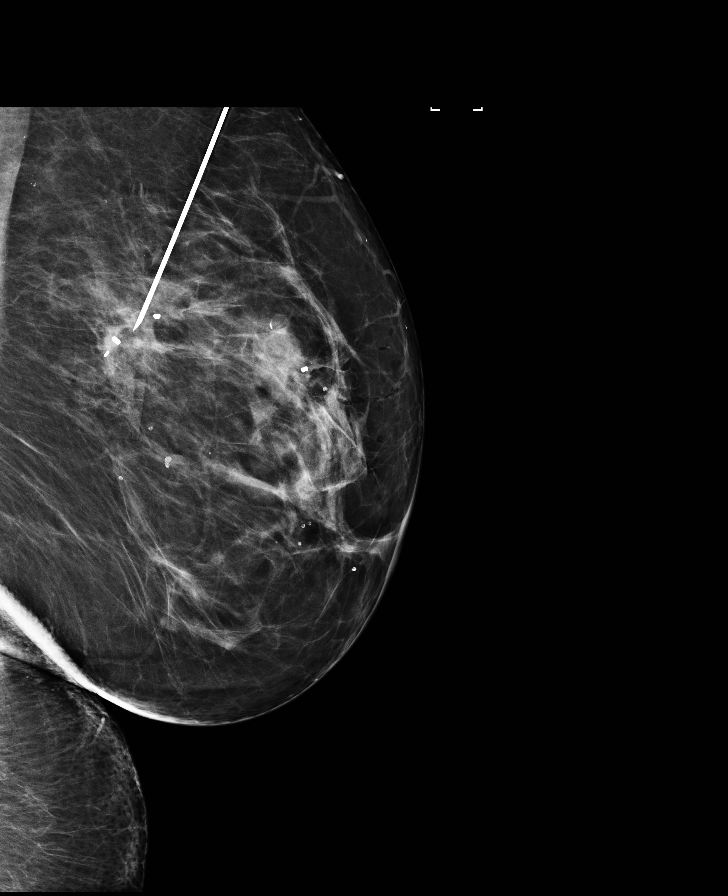

[8 of 9 positions shown; findings below may reference images not displayed]

FINDINGS: Patient presents for radioactive seed localization prior to left
breast lumpectomies. I met with the patient and we discussed the
procedure of seed localization including benefits and alternatives.
We discussed the high likelihood of a successful procedure. We
discussed the risks of the procedure including infection, bleeding,
tissue injury and further surgery. We discussed the low dose of
radioactivity involved in the procedure. Informed, written consent
was given.

The usual time-out protocol was performed immediately prior to the
procedure.

Using mammographic guidance, sterile technique with chlorhexidine as
skin antisepsis, 1% lidocaine as local anesthesia and an F-ITX
radioactive seed, the cylinder shaped tissue marker clip in the
upper outer quadrant was initially localized using a lateral
approach.

Subsequently, again using mammographic guidance, sterile technique
with chlorhexidine as skin antisepsis, 1% lidocaine as local
anesthesia and an F-ITX radioactive seed, the dumbbell-shaped tissue
marker clip in the upper inner quadrant was localized using a
superior approach.

The follow-up mammogram images confirm the seeds in the expected
locations and were marked for Dr. Henden.

Follow-up survey of the patient confirms the presence of the
radioactive seeds.

Order number of F-ITX seed (upper outer quadrant, cylinder clip):

Order number of I 125 seed (upper inner quadrant, dumbbell clip):

Total activity (upper outer quadrant, cylinder clip): 0.252 mCi
Reference Date: 07/26/2016

Total activity (upper inner quadrant, dumbbell clip): 0.248 mCi
Reference date: 08/05/2016

The patient tolerated the procedure well and was released from the
[REDACTED]. She was given instructions regarding seed removal.
IMPRESSION: Radioactive seed localization of 2 separate lesions in the left
breast as detailed above. No apparent complications.

## 2017-09-20 DIAGNOSIS — Z85828 Personal history of other malignant neoplasm of skin: Secondary | ICD-10-CM | POA: Diagnosis not present

## 2017-09-20 DIAGNOSIS — D0471 Carcinoma in situ of skin of right lower limb, including hip: Secondary | ICD-10-CM | POA: Diagnosis not present

## 2017-09-20 DIAGNOSIS — D0472 Carcinoma in situ of skin of left lower limb, including hip: Secondary | ICD-10-CM | POA: Diagnosis not present

## 2017-09-20 DIAGNOSIS — B0089 Other herpesviral infection: Secondary | ICD-10-CM | POA: Diagnosis not present

## 2017-10-21 ENCOUNTER — Other Ambulatory Visit: Payer: Self-pay | Admitting: Oncology

## 2017-11-25 DIAGNOSIS — R0989 Other specified symptoms and signs involving the circulatory and respiratory systems: Secondary | ICD-10-CM | POA: Diagnosis not present

## 2017-11-25 DIAGNOSIS — R062 Wheezing: Secondary | ICD-10-CM | POA: Diagnosis not present

## 2017-11-25 DIAGNOSIS — J208 Acute bronchitis due to other specified organisms: Secondary | ICD-10-CM | POA: Diagnosis not present

## 2017-12-01 ENCOUNTER — Telehealth: Payer: Self-pay | Admitting: Oncology

## 2017-12-01 NOTE — Telephone Encounter (Signed)
GM PAL 5/28. Per GM moved from [facility] to [facility] same date/time - confirmed with LC. Spoke with patient she is aware.

## 2017-12-05 ENCOUNTER — Encounter: Payer: Self-pay | Admitting: Adult Health

## 2017-12-05 ENCOUNTER — Other Ambulatory Visit: Payer: Self-pay | Admitting: Adult Health

## 2017-12-05 ENCOUNTER — Inpatient Hospital Stay: Payer: BLUE CROSS/BLUE SHIELD | Attending: Oncology | Admitting: Adult Health

## 2017-12-05 ENCOUNTER — Inpatient Hospital Stay: Payer: BLUE CROSS/BLUE SHIELD

## 2017-12-05 VITALS — BP 111/78 | HR 94 | Temp 98.3°F | Resp 18 | Ht 62.0 in | Wt 174.4 lb

## 2017-12-05 DIAGNOSIS — Z170 Estrogen receptor positive status [ER+]: Principal | ICD-10-CM

## 2017-12-05 DIAGNOSIS — R232 Flushing: Secondary | ICD-10-CM

## 2017-12-05 DIAGNOSIS — C50912 Malignant neoplasm of unspecified site of left female breast: Secondary | ICD-10-CM

## 2017-12-05 DIAGNOSIS — C50212 Malignant neoplasm of upper-inner quadrant of left female breast: Secondary | ICD-10-CM

## 2017-12-05 DIAGNOSIS — Z79810 Long term (current) use of selective estrogen receptor modulators (SERMs): Secondary | ICD-10-CM | POA: Diagnosis not present

## 2017-12-05 LAB — CBC WITH DIFFERENTIAL (CANCER CENTER ONLY)
BASOS ABS: 0.1 10*3/uL (ref 0.0–0.1)
BASOS PCT: 1 %
EOS PCT: 3 %
Eosinophils Absolute: 0.2 10*3/uL (ref 0.0–0.5)
HCT: 40.8 % (ref 34.8–46.6)
Hemoglobin: 13.8 g/dL (ref 11.6–15.9)
Lymphocytes Relative: 25 %
Lymphs Abs: 2 10*3/uL (ref 0.9–3.3)
MCH: 29.2 pg (ref 25.1–34.0)
MCHC: 33.8 g/dL (ref 31.5–36.0)
MCV: 86.5 fL (ref 79.5–101.0)
MONO ABS: 0.6 10*3/uL (ref 0.1–0.9)
Monocytes Relative: 7 %
Neutro Abs: 5 10*3/uL (ref 1.5–6.5)
Neutrophils Relative %: 64 %
PLATELETS: 335 10*3/uL (ref 145–400)
RBC: 4.72 MIL/uL (ref 3.70–5.45)
RDW: 13.2 % (ref 11.2–14.5)
WBC Count: 7.9 10*3/uL (ref 3.9–10.3)

## 2017-12-05 LAB — CMP (CANCER CENTER ONLY)
ALT: 33 U/L (ref 0–55)
AST: 27 U/L (ref 5–34)
Albumin: 3.7 g/dL (ref 3.5–5.0)
Alkaline Phosphatase: 66 U/L (ref 40–150)
Anion gap: 11 (ref 3–11)
BUN: 15 mg/dL (ref 7–26)
CHLORIDE: 101 mmol/L (ref 98–109)
CO2: 28 mmol/L (ref 22–29)
Calcium: 9.9 mg/dL (ref 8.4–10.4)
Creatinine: 0.76 mg/dL (ref 0.60–1.10)
GFR, Est AFR Am: >60 mL/min (ref >60)
GFR, Estimated: >60 mL/min (ref >60)
GLUCOSE: 95 mg/dL (ref 70–140)
POTASSIUM: 4.3 mmol/L (ref 3.5–5.1)
SODIUM: 140 mmol/L (ref 136–145)
Total Bilirubin: <0.2 mg/dL — ABNORMAL LOW (ref 0.2–1.2)
Total Protein: 7.2 g/dL (ref 6.4–8.3)

## 2017-12-05 MED ORDER — GABAPENTIN 100 MG PO CAPS: 100.0000 mg | ORAL_CAPSULE | Freq: Every day | ORAL | 0 refills | Status: DC

## 2017-12-05 NOTE — Progress Notes (Signed)
Felicia Hampton  [phone] [phone]     ID: Felicia Hampton DOB: [birth year]  MR#: [id number]  HEN#:[id number]  Patient Care Team: Patient, No Pcp Per as PCP - General (General Practice) Magrinat, Virgie Dad, MD as Consulting Physician (Oncology) Rolm Bookbinder, MD as Consulting Physician (General Surgery) Maisie Fus, MD as Consulting Physician (Obstetrics and Gynecology) Irene Limbo, MD as Consulting Physician (Plastic Surgery) Delice Bison, Charlestine Massed, NP as Nurse Practitioner (Hematology and Oncology) OTHER MD:  CHIEF COMPLAINT: Estrogen receptor positive breast cancer  CURRENT TREATMENT: Tamoxifen   BREAST CANCER HISTORY: From the original consult note:  Felicia Hampton had routine screening mammography at Dr. Verlon Au suggesting an abnormality in her left breast upper inner quadrant. Breast density was category B. She was referred for ultrasonography which did not identify the mass. It also found the axilla to be clear sonographically. Breast MRI 07/01/2016 found no abnormalities in the right breast. In the left breast central upper section there was a 0.8 cm enhancing mass associated with architectural distortion. There was a 6 cm area of non-masslike enhancement in the upper outer quadrant of the left breast 3 cm lateral to the other lesion. There were no abnormal appearing lymph nodes.  On 07/12/2016 she underwent MRI guided core biopsies of these 2 areas in question. The final pathology (SAA [id number]) showed in the upper inner quadrant, invasive ductal carcinoma, grade 1, estrogen receptor 100% positive, progesterone receptor 100% positive, both with strong staining intensity, with an MIB-1 of 2%, and no HER-2 amplification, the signals ratio being 1.89 and the number per cell 2.65. In the upper outer quadrant biopsy there was only atypical lobular hyperplasia.  The patient's subsequent history is as detailed below  INTERVAL HISTORY: Felicia Hampton returns  today for follow-up and treatment of her estrogen receptor positive breast cancer.  She continues on tamoxifen, with good tolerance.  She notes that the hot flashes she has been having have worsened over the past few weeks.    REVIEW OF SYSTEMS: Felicia Hampton is doing well other than the hot flashes.  She notes that she had bronchitis and was recently on a Prednisone taper.  She also notes that she has hand two squamous cells removed from her legs.  She is doing well otherwise today.  She has dry eyes and sees an eye doctor regularly for this.  She denies any new pain, headaches, breast changes, vaginal bleeding, or other concerns today.  A detailed ROS was conducted and is non contributory today.    PAST MEDICAL HISTORY: Past Medical History:  Diagnosis Date  . Adult ADHD   . Cancer [facility]) 2018   left breast  . Family history of breast cancer   . Family history of colon cancer   . Family history of ovarian cancer   . Genetic testing 08/02/2016   Negative; No pathogenic mutations detected. STAT Breast panel with reflex to Common Cancers panel  Genes tested were the 43 genes on [facility]'s Common Cancers panel (APC, ATM, AXIN2, BARD1, BMPR1A, BRCA1, BRCA2, BRIP1, CDH1, CDKN2A, CHEK2, DICER1, EPCAM, GREM1, HOXB13, KIT, MEN1, MLH1, MSH2, MSH6, MUTYH, NBN, NF1, PALB2, PDGFRA, PMS2, POLD1, POLE, PTEN, RAD50, RAD51C, RAD51D, SDHA, SDHB, SDHC, SDHD  . History of radiation therapy 10/10/16-11/24/16   left breast 50.4 Gy in 28 fractions, left breast boost 12 Gy in 6 fractions  . Hypercholesteremia   . PONV (postoperative nausea and vomiting)     PAST SURGICAL HISTORY: Past Surgical History:  Procedure Laterality Date  . ABDOMINAL  HYSTERECTOMY    . BREAST REDUCTION SURGERY Bilateral 08/23/2016   Procedure: BILATERAL ONCOPLASTIC BREAST REDUCTION;  Surgeon: Irene Limbo, MD;  Location: [facility];  Service: Plastics;  Laterality: Bilateral;  . KNEE ARTHROSCOPY     x3  . RADIOACTIVE SEED GUIDED  PARTIAL MASTECTOMY WITH AXILLARY SENTINEL LYMPH NODE BIOPSY Left 08/11/2016   Procedure: RADIOACTIVE SEED GUIDED LEFT BREAST LUMPECTOMY WITH AXILLARY SENTINEL LYMPH NODE BIOPSY;  Surgeon: Rolm Bookbinder, MD;  Location: [facility];  Service: General;  Laterality: Left;  . SHOULDER ARTHROSCOPY W/ ROTATOR CUFF REPAIR Right     FAMILY HISTORY Family History  Problem Relation Age of Onset  . Colon cancer Sister 63       deceased 32  . Melanoma Brother        on back; currently 60  . Ovarian cancer Maternal Aunt   . Colon cancer Paternal Grandmother 70       deceased  . Breast cancer Cousin 44       mat female cousin; daughter of unaffected mat aunt; deceased 40  . Prostate cancer Father        deceased 80  The patient's father died from prostate cancer at age 10. The patient's mother died from heart disease at age 66. The patient had 5 brothers, 6 sisters. A twin sister died at the age of 82 from colon cancer. A maternal cousin had breast cancer at age 68. A maternal aunt had ovarian cancer, age at diagnosis unknown. Another maternal aunt had kidney cancer. On the father's side A grandmother died at age 43 with colon cancer  GYNECOLOGIC HISTORY:  No LMP recorded. Patient is postmenopausal. Menarche age 50, first live birth age 79. The patient is GX P1. She Underwent hysterectomy in her late 48s and was on hormone replacement for approximately 10 years, stopping approximately 2016  SOCIAL HISTORY:  Felicia Hampton has an interesting athletic history and she swam the [place] channel to [place] and also did other long-term swims in her youth. She is originally from [place]. 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 He has children of his. The patient's son Felicia Hampton lives in [place] and is in Transport planner. The patient has 1 biological granddaughter, 84 months old as of January 2018.    ADVANCED DIRECTIVES:  Not in place   HEALTH MAINTENANCE: Social History   Tobacco Use  . Smoking status: Former Smoker    Packs/day: 0.50    Years: 10.00    Pack years: 5.00    Last attempt to quit: 08/02/1988    Years since quitting: 29.3  . Smokeless tobacco: Never Used  Substance Use Topics  . Alcohol use: Yes    Comment: social  . Drug use: No     Colonoscopy: July 2016  PAP: 2016  Bone density: 2015   Allergies  Allergen Reactions  . Ace Inhibitors Palpitations and Shortness Of Breath    COX-2 inhibitors  . Diclofenac     Other reaction(s): Hypotension (ALLERGY/intolerance)  . Morphine Nausea And Vomiting  . Morphine And Related Nausea And Vomiting  . Oxycodone Other (See Comments)    hallucinations    Current Outpatient Medications  Medication Sig Dispense Refill  . atorvastatin (LIPITOR) 20 MG tablet Take 20 mg by mouth daily at 6 PM.    . Cholecalciferol (VITAMIN D) 2000 units tablet Take by mouth.    Lowanda Foster (XIIDRA) 5 %  SOLN Apply 1 drop to eye 2 (two) times daily.    . Multiple Vitamin (MULTIVITAMIN) capsule Take by mouth.    . tamoxifen (NOLVADEX) 20 MG tablet TAKE 1 TABLET EVERY DAY 90 tablet 1  . venlafaxine XR (EFFEXOR-XR) 75 MG 24 hr capsule Take 2 capsules (150 mg total) by mouth daily with breakfast. 30 capsule 12  . zolpidem (AMBIEN) 10 MG tablet Take 10 mg by mouth at bedtime as needed for sleep.    Marland Kitchen gabapentin (NEURONTIN) 100 MG capsule Take 1-3 capsules (100-300 mg total) by mouth at bedtime. 90 capsule 0   No current facility-administered medications for this visit.     OBJECTIVE:   Vitals:   12/05/17 1449  BP: 111/78  Pulse: 94  Resp: 18  Temp: 98.3 F (36.8 C)  SpO2: 98%     Body mass index is 31.9 kg/m.    ECOG FS:1 - Symptomatic but completely ambulatory GENERAL: Patient is a well appearing female in no acute distress HEENT:  Sclerae anicteric.  Oropharynx clear and moist. No ulcerations or evidence of oropharyngeal candidiasis. Neck is  supple.  NODES:  No cervical, supraclavicular, or axillary lymphadenopathy palpated.  BREAST EXAM:  Left breast s/p lumpectomy, reduction, and radiation, no nodules or masses present, right breast s/p reduction without nodules or masses noted LUNGS:  Clear to auscultation bilaterally.  No wheezes or rhonchi. HEART:  Regular rate and rhythm. No murmur appreciated. ABDOMEN:  Soft, nontender.  Positive, normoactive bowel sounds. No organomegaly palpated. MSK:  No focal spinal tenderness to palpation. Full range of motion bilaterally in the upper extremities. EXTREMITIES:  No peripheral edema.   SKIN:  Clear with no obvious rashes or skin changes. No nail dyscrasia. NEURO:  Nonfocal. Well oriented.  Appropriate affect.     LAB RESULTS:  CMP     Component Value Date/Time   NA 139 06/07/2017 1344   K 3.7 06/07/2017 1344   CO2 26 06/07/2017 1344   GLUCOSE 79 06/07/2017 1344   BUN 13.2 06/07/2017 1344   CREATININE 0.7 06/07/2017 1344   CALCIUM 9.1 06/07/2017 1344   PROT 6.7 06/07/2017 1344   ALBUMIN 3.7 06/07/2017 1344   AST 21 06/07/2017 1344   ALT 20 06/07/2017 1344   ALKPHOS 64 06/07/2017 1344   BILITOT 0.29 06/07/2017 1344    INo results found for: SPEP, UPEP  Lab Results  Component Value Date   WBC 7.9 12/05/2017   NEUTROABS 5.0 12/05/2017   HGB 13.8 12/05/2017   HCT 40.8 12/05/2017   MCV 86.5 12/05/2017   PLT 335 12/05/2017      Chemistry      Component Value Date/Time   NA 139 06/07/2017 1344   K 3.7 06/07/2017 1344   CO2 26 06/07/2017 1344   BUN 13.2 06/07/2017 1344   CREATININE 0.7 06/07/2017 1344      Component Value Date/Time   CALCIUM 9.1 06/07/2017 1344   ALKPHOS 64 06/07/2017 1344   AST 21 06/07/2017 1344   ALT 20 06/07/2017 1344   BILITOT 0.29 06/07/2017 1344       No results found for: LABCA2  No components found for: LABCA125  No results for input(s): INR in the last 168 hours.  Urinalysis No results found for: COLORURINE, APPEARANCEUR,  LABSPEC, PHURINE, GLUCOSEU, HGBUR, BILIRUBINUR, KETONESUR, PROTEINUR, UROBILINOGEN, NITRITE, LEUKOCYTESUR   STUDIES:   ELIGIBLE FOR AVAILABLE RESEARCH PROTOCOL: no  ASSESSMENT: 62 y.o. [place] woman status post left breast upper inner quadrant biopsy 07/12/2016 for a  clinical T1b N0, stage IA invasive ductal carcinoma, grade 1 estrogen and progesterone receptor strongly positive, HER-2 not amplified, with an MIB-1 of 2%  (a) biopsy of an area of non-masslike enhancement in the upper outer quadrant 07/12/2016 showed atypical lobular hyperplasia  (1) genetics testing through the [facility]'s STAT breast panel 07/21/2016 found no deleterious mutations in ATM, BRCA1, BRCA2, CDH1, CHEK2, PALB2, PTEN, STK11, TP53. There were also no deleterious mutations in the 43 genes on [facility]'s Common Cancers panel (APC, ATM, AXIN2, BARD1, BMPR1A, BRCA1, BRCA2, BRIP1, CDH1, CDKN2A, CHEK2, DICER1, EPCAM, GREM1, HOXB13, KIT, MEN1, MLH1, MSH2, MSH6, MUTYH, NBN, NF1, PALB2, PDGFRA, PMS2, POLD1, POLE, PTEN, RAD50, RAD51C, RAD51D, SDHA, SDHB, SDHC, SDHD, SMAD4, SMARCA4, STK11, TP53, TSC1, TSC2, VHL  (2)  Status post left lumpectomy and sentinel lymph node sampling 08/11/2016 for a pT1a pN0, stage IA  Invasive ductal carcinoma, grade 1, repeat HER-2 testing again negative  (a)   Pathology from bilateral mammoplastic surgery 08/23/2016 was benign (including lumpectomy site)  (3) given the l tumor size, low grade and low proliferation fraction, chemotherapy is not indicated  (4) adjuvant radiation 10/10/16 - 11/24/16 : Left Breast treated to 50.4 Gy in 28 fractions, then Boosted an additional 12 Gy in 6 fractions  (5) started tamoxifen in late June 2018, hot flashes--on Effexor  PLAN: Goldia is doing moderately well today.  She has no clinical or radiographic sign of recurrence.  She will continue on Tamoxifen.  She continues to suffer from hot flashes.  Due to this, she is taking effexor daily.  I added gabapentin  100-372m QHS.  She will try this.  We reviewed non pharmacologic interventions including acupuncture.  She is going to consider this.  She also recently finished up a steroid taper which could have temporarily worsened these.    I counseled her in healthy diet and exercise today and wrote for compression bras and tank tops for her.    SJaselynwill return in 6 months for f/u with Dr. MJana Hakim  She knows to call for any problems that may develop before that visit.  A total of (30) minutes of face-to-face time was spent with this patient with greater than 50% of that time in counseling and care-coordination.  LWilber Bihari NP  12/05/17 3:33 PM Medical Oncology and Hematology [facility]AM[place] [id number]. [phone]   Fax. [phone]

## 2017-12-05 NOTE — Patient Instructions (Signed)
Gabapentin capsules or tablets What is this medicine? GABAPENTIN (GA ba pen tin) is used to control partial seizures in adults with epilepsy. It is also used to treat certain types of nerve pain. This medicine may be used for other purposes; ask your health care provider or pharmacist if you have questions. COMMON BRAND NAME(S): Active-PAC with Gabapentin, Gabarone, Neurontin What should I tell my health care provider before I take this medicine? They need to know if you have any of these conditions: -kidney disease -suicidal thoughts, plans, or attempt; a previous suicide attempt by you or a family member -an unusual or allergic reaction to gabapentin, other medicines, foods, dyes, or preservatives -pregnant or trying to get pregnant -breast-feeding How should I use this medicine? Take this medicine by mouth with a glass of water. Follow the directions on the prescription label. You can take it with or without food. If it upsets your stomach, take it with food.Take your medicine at regular intervals. Do not take it more often than directed. Do not stop taking except on your doctor's advice. If you are directed to break the 600 or 800 mg tablets in half as part of your dose, the extra half tablet should be used for the next dose. If you have not used the extra half tablet within 28 days, it should be thrown away. A special MedGuide will be given to you by the pharmacist with each prescription and refill. Be sure to read this information carefully each time. Talk to your pediatrician regarding the use of this medicine in children. Special care may be needed. Overdosage: If you think you have taken too much of this medicine contact a poison control center or emergency room at once. NOTE: This medicine is only for you. Do not share this medicine with others. What if I miss a dose? If you miss a dose, take it as soon as you can. If it is almost time for your next dose, take only that dose. Do not  take double or extra doses. What may interact with this medicine? Do not take this medicine with any of the following medications: -other gabapentin products This medicine may also interact with the following medications: -alcohol -antacids -antihistamines for allergy, cough and cold -certain medicines for anxiety or sleep -certain medicines for depression or psychotic disturbances -homatropine; hydrocodone -naproxen -narcotic medicines (opiates) for pain -phenothiazines like chlorpromazine, mesoridazine, prochlorperazine, thioridazine This list may not describe all possible interactions. Give your health care provider a list of all the medicines, herbs, non-prescription drugs, or dietary supplements you use. Also tell them if you smoke, drink alcohol, or use illegal drugs. Some items may interact with your medicine. What should I watch for while using this medicine? Visit your doctor or health care professional for regular checks on your progress. You may want to keep a record at home of how you feel your condition is responding to treatment. You may want to share this information with your doctor or health care professional at each visit. You should contact your doctor or health care professional if your seizures get worse or if you have any new types of seizures. Do not stop taking this medicine or any of your seizure medicines unless instructed by your doctor or health care professional. Stopping your medicine suddenly can increase your seizures or their severity. Wear a medical identification bracelet or chain if you are taking this medicine for seizures, and carry a card that lists all your medications. You may get drowsy, dizzy,   or have blurred vision. Do not drive, use machinery, or do anything that needs mental alertness until you know how this medicine affects you. To reduce dizzy or fainting spells, do not sit or stand up quickly, especially if you are an older patient. Alcohol can  increase drowsiness and dizziness. Avoid alcoholic drinks. Your mouth may get dry. Chewing sugarless gum or sucking hard candy, and drinking plenty of water will help. The use of this medicine may increase the chance of suicidal thoughts or actions. Pay special attention to how you are responding while on this medicine. Any worsening of mood, or thoughts of suicide or dying should be reported to your health care professional right away. Women who become pregnant while using this medicine may enroll in the [place] American Antiepileptic Drug Pregnancy Registry by calling 1-888-233-2334. This registry collects information about the safety of antiepileptic drug use during pregnancy. What side effects may I notice from receiving this medicine? Side effects that you should report to your doctor or health care professional as soon as possible: -allergic reactions like skin rash, itching or hives, swelling of the face, lips, or tongue -worsening of mood, thoughts or actions of suicide or dying Side effects that usually do not require medical attention (report to your doctor or health care professional if they continue or are bothersome): -constipation -difficulty walking or controlling muscle movements -dizziness -nausea -slurred speech -tiredness -tremors -weight gain This list may not describe all possible side effects. Call your doctor for medical advice about side effects. You may report side effects to FDA at 1-800-FDA-1088. Where should I keep my medicine? Keep out of reach of children. This medicine may cause accidental overdose and death if it taken by other adults, children, or pets. Mix any unused medicine with a substance like cat litter or coffee grounds. Then throw the medicine away in a sealed container like a sealed bag or a coffee can with a lid. Do not use the medicine after the expiration date. Store at room temperature between 15 and 30 degrees C (59 and 86 degrees F). NOTE: This  sheet is a summary. It may not cover all possible information. If you have questions about this medicine, talk to your doctor, pharmacist, or health care provider.  2018 Elsevier/Gold Standard (2013-08-23 15:26:50)  

## 2017-12-11 DIAGNOSIS — C50212 Malignant neoplasm of upper-inner quadrant of left female breast: Secondary | ICD-10-CM | POA: Diagnosis not present

## 2017-12-19 DIAGNOSIS — Z6831 Body mass index (BMI) 31.0-31.9, adult: Secondary | ICD-10-CM | POA: Diagnosis not present

## 2017-12-19 DIAGNOSIS — E559 Vitamin D deficiency, unspecified: Secondary | ICD-10-CM | POA: Diagnosis not present

## 2017-12-19 DIAGNOSIS — Z0000 Encounter for general adult medical examination without abnormal findings: Secondary | ICD-10-CM | POA: Diagnosis not present

## 2017-12-19 DIAGNOSIS — R232 Flushing: Secondary | ICD-10-CM | POA: Diagnosis not present

## 2017-12-19 DIAGNOSIS — E2839 Other primary ovarian failure: Secondary | ICD-10-CM | POA: Diagnosis not present

## 2017-12-21 ENCOUNTER — Other Ambulatory Visit: Payer: Self-pay | Admitting: Family Medicine

## 2017-12-21 DIAGNOSIS — E2839 Other primary ovarian failure: Secondary | ICD-10-CM

## 2018-01-01 ENCOUNTER — Other Ambulatory Visit: Payer: Self-pay

## 2018-01-01 DIAGNOSIS — R232 Flushing: Secondary | ICD-10-CM

## 2018-01-01 DIAGNOSIS — Z170 Estrogen receptor positive status [ER+]: Principal | ICD-10-CM

## 2018-01-01 DIAGNOSIS — C50212 Malignant neoplasm of upper-inner quadrant of left female breast: Secondary | ICD-10-CM

## 2018-01-01 MED ORDER — GABAPENTIN 100 MG PO CAPS: 100.0000 mg | ORAL_CAPSULE | Freq: Every day | ORAL | 0 refills | Status: DC

## 2018-01-23 ENCOUNTER — Other Ambulatory Visit: Payer: Self-pay | Admitting: Oncology

## 2018-01-23 DIAGNOSIS — Z170 Estrogen receptor positive status [ER+]: Principal | ICD-10-CM

## 2018-01-23 DIAGNOSIS — C50212 Malignant neoplasm of upper-inner quadrant of left female breast: Secondary | ICD-10-CM

## 2018-01-23 DIAGNOSIS — R232 Flushing: Secondary | ICD-10-CM

## 2018-02-02 ENCOUNTER — Ambulatory Visit
Admission: RE | Admit: 2018-02-02 | Discharge: 2018-02-02 | Disposition: A | Discharge status: Home / Self Care | Payer: BLUE CROSS/BLUE SHIELD | Source: Ambulatory Visit | Attending: Family Medicine | Admitting: Family Medicine

## 2018-02-02 DIAGNOSIS — E2839 Other primary ovarian failure: Secondary | ICD-10-CM

## 2018-02-02 DIAGNOSIS — M8589 Other specified disorders of bone density and structure, multiple sites: Secondary | ICD-10-CM | POA: Diagnosis not present

## 2018-02-02 DIAGNOSIS — Z780 Asymptomatic menopausal state: Secondary | ICD-10-CM | POA: Diagnosis not present

## 2018-03-14 DIAGNOSIS — H2513 Age-related nuclear cataract, bilateral: Secondary | ICD-10-CM | POA: Diagnosis not present

## 2018-03-23 DIAGNOSIS — L570 Actinic keratosis: Secondary | ICD-10-CM | POA: Diagnosis not present

## 2018-03-23 DIAGNOSIS — Z85828 Personal history of other malignant neoplasm of skin: Secondary | ICD-10-CM | POA: Diagnosis not present

## 2018-03-23 DIAGNOSIS — D0472 Carcinoma in situ of skin of left lower limb, including hip: Secondary | ICD-10-CM | POA: Diagnosis not present

## 2018-03-23 DIAGNOSIS — L814 Other melanin hyperpigmentation: Secondary | ICD-10-CM | POA: Diagnosis not present

## 2018-03-23 DIAGNOSIS — L821 Other seborrheic keratosis: Secondary | ICD-10-CM | POA: Diagnosis not present

## 2018-04-13 ENCOUNTER — Encounter: Payer: Self-pay | Admitting: *Deleted

## 2018-04-15 ENCOUNTER — Other Ambulatory Visit: Payer: Self-pay | Admitting: Oncology

## 2018-04-19 ENCOUNTER — Other Ambulatory Visit: Payer: Self-pay | Admitting: Oncology

## 2018-04-19 DIAGNOSIS — Z170 Estrogen receptor positive status [ER+]: Principal | ICD-10-CM

## 2018-04-19 DIAGNOSIS — R232 Flushing: Secondary | ICD-10-CM

## 2018-04-19 DIAGNOSIS — C50212 Malignant neoplasm of upper-inner quadrant of left female breast: Secondary | ICD-10-CM

## 2018-05-15 NOTE — Progress Notes (Signed)
[facility] [address] [place], [place] [address] Phone: [phone] Subjective:   Felicia Hampton, am serving as a scribe for Felicia Hampton.    CC: Left knee pain  FEO:[id number]  Felicia Hampton is a 62 y.o. female coming in with complaint of left knee pain. She hyperextended her knee during a fall in May. She said that 2 weeks ago her pain increased in the posterior lateral aspect of knee. Used Meloxicam for the past 7 days. Pain intermittent.. Pain with descending stairs. History of mensicus surgery. Does work out 5 days a week but has cut back due to injury.        Past Medical History:  Diagnosis Date  . Adult ADHD   . Cancer [facility]) 2018   left breast  . Family history of breast cancer   . Family history of colon cancer   . Family history of ovarian cancer   . Genetic testing 08/02/2016   Negative; Hampton pathogenic mutations detected. STAT Breast panel with reflex to Common Cancers panel  Genes tested were the 43 genes on [facility]'s Common Cancers panel (APC, ATM, AXIN2, BARD1, BMPR1A, BRCA1, BRCA2, BRIP1, CDH1, CDKN2A, CHEK2, DICER1, EPCAM, GREM1, HOXB13, KIT, MEN1, MLH1, MSH2, MSH6, MUTYH, NBN, NF1, PALB2, PDGFRA, PMS2, POLD1, POLE, PTEN, RAD50, RAD51C, RAD51D, SDHA, SDHB, SDHC, SDHD  . History of radiation therapy 10/10/16-11/24/16   left breast 50.4 Gy in 28 fractions, left breast boost 12 Gy in 6 fractions  . Hypercholesteremia   . PONV (postoperative nausea and vomiting)    Past Surgical History:  Procedure Laterality Date  . ABDOMINAL HYSTERECTOMY    . BREAST REDUCTION SURGERY Bilateral 08/23/2016   Procedure: BILATERAL ONCOPLASTIC BREAST REDUCTION;  Surgeon: Felicia Limbo, MD;  Location: [facility];  Service: Plastics;  Laterality: Bilateral;  . KNEE ARTHROSCOPY     x3  . RADIOACTIVE SEED GUIDED PARTIAL MASTECTOMY WITH AXILLARY SENTINEL LYMPH NODE BIOPSY Left 08/11/2016   Procedure: RADIOACTIVE SEED GUIDED LEFT BREAST  LUMPECTOMY WITH AXILLARY SENTINEL LYMPH NODE BIOPSY;  Surgeon: Felicia Bookbinder, MD;  Location: [facility];  Service: General;  Laterality: Left;  . SHOULDER ARTHROSCOPY W/ ROTATOR CUFF REPAIR Right    Social History   Socioeconomic History  . Marital status: Single    Spouse name: Not on file  . Number of children: 1  . Years of education: Not on file  . Highest education level: Not on file  Occupational History  . Occupation: Therapist, sports  Social Needs  . Financial resource strain: Not on file  . Food insecurity:    Worry: Not on file    Inability: Not on file  . Transportation needs:    Medical: Not on file    Non-medical: Not on file  Tobacco Use  . Smoking status: Former Smoker    Packs/day: 0.50    Years: 10.00    Pack years: 5.00    Last attempt to quit: 08/02/1988    Years since quitting: 29.8  . Smokeless tobacco: Never Used  Substance and Sexual Activity  . Alcohol use: Yes    Comment: social  . Drug use: Hampton  . Sexual activity: Not on file  Lifestyle  . Physical activity:    Days per week: Not on file    Minutes per session: Not on file  . Stress: Not on file  Relationships  . Social connections:    Talks on phone: Not on file  Gets together: Not on file    Attends religious service: Not on file    Active member of club or organization: Not on file    Attends meetings of clubs or organizations: Not on file    Relationship status: Not on file  Other Topics Concern  . Not on file  Social History Narrative  . Not on file   Allergies  Allergen Reactions  . Ace Inhibitors Palpitations and Shortness Of Breath    COX-2 inhibitors  . Diclofenac     Other reaction(s): Hypotension (ALLERGY/intolerance)  . Morphine Nausea And Vomiting  . Morphine And Related Nausea And Vomiting  . Oxycodone Other (See Comments)    hallucinations   Family History  Problem Relation Age of Onset  . Colon cancer Sister 66       deceased 46  . Melanoma Brother         on back; currently 28  . Ovarian cancer Maternal Aunt   . Colon cancer Paternal Grandmother 50       deceased  . Breast cancer Cousin 36       mat female cousin; daughter of unaffected mat aunt; deceased 46  . Prostate cancer Father        deceased 53     Current Outpatient Medications (Cardiovascular):  .  atorvastatin (LIPITOR) 20 MG tablet, Take 20 mg by mouth daily at 6 PM.     Current Outpatient Medications (Other):  Marland Kitchen  Cholecalciferol (VITAMIN D) 2000 units tablet, Take by mouth. Marland Kitchen  Lifitegrast (XIIDRA) 5 % SOLN, Apply 1 drop to eye 2 (two) times daily. .  Multiple Vitamin (MULTIVITAMIN) capsule, Take by mouth. .  tamoxifen (NOLVADEX) 20 MG tablet, TAKE 1 TABLET EVERY DAY .  venlafaxine XR (EFFEXOR-XR) 75 MG 24 hr capsule, Take 2 capsules (150 mg total) by mouth daily with breakfast. .  zolpidem (AMBIEN) 10 MG tablet, Take 10 mg by mouth at bedtime as needed for sleep. .  Diclofenac Sodium 2 % SOLN, Place 2 g onto the skin 2 (two) times daily.    Past medical history, social, surgical and family history all reviewed in electronic medical record.  Hampton pertanent information unless stated regarding to the chief complaint.   Review of Systems:  Hampton headache, visual changes, nausea, vomiting, diarrhea, constipation, dizziness, abdominal pain, skin rash, fevers, chills, night sweats, weight loss, swollen lymph nodes, body aches, joint swelling, muscle aches, chest pain, shortness of breath, mood changes.   Objective  Blood pressure 104/66, pulse 90, height 5' 2"  (1.575 m), weight 178 lb (80.7 kg), SpO2 98 %.    General: Hampton apparent distress alert and oriented x3 mood and affect normal, dressed appropriately.  HEENT: Pupils equal, extraocular movements intact  Respiratory: Patient's speak in full sentences and does not appear short of breath  Cardiovascular: Hampton lower extremity edema, non tender, Hampton erythema  Skin: Warm dry intact with Hampton signs of infection or rash on  extremities or on axial skeleton.  Abdomen: Soft nontender  Neuro: Cranial nerves II through XII are intact, neurovascularly intact in all extremities with 2+ DTRs and 2+ pulses.  Lymph: Hampton lymphadenopathy of posterior or anterior cervical chain or axillae bilaterally.  Gait normal with good balance and coordination.  MSK:  Non tender with full range of motion and good stability and symmetric strength and tone of shoulders, elbows, wrist, hip, nd ankles bilaterally.  Knee: Left Normal to inspection with Hampton erythema or effusion or obvious bony abnormalities. Palpation tender  over the medial patellofemoral joint ROM full in flexion and extension and lower leg rotation. Ligaments with solid consistent endpoints including ACL, PCL, LCL, MCL. Negative Mcmurray's, Apley's, and Thessalonian tests. Mild painful patellar compression. Patellar glide mild crepitus. Patellar and quadriceps tendons unremarkable. Hamstring and quadriceps strength is normal. Contralateral knee unremarkable  MSK US performed of: Left knee This study was ordered, performed, and interpreted by Charlann Boxer D.O.  Knee: Patient does have arthritic changes throughout the knee.  Moderate in severity.  Trace effusion noted.  Popliteal tendon does have a intrasubstance tear noted.  IMPRESSION: Moderate arthritic changes.  Popliteal tendon tear     Impression and Recommendations:     This case required medical decision making of moderate complexity. The above documentation has been reviewed and is accurate and complete Felicia Pulley, DO       Note: This dictation was prepared with Dragon dictation along with smaller phrase technology. Any transcriptional errors that result from this process are unintentional.

## 2018-05-16 ENCOUNTER — Ambulatory Visit: Payer: BLUE CROSS/BLUE SHIELD | Admitting: Family Medicine

## 2018-05-16 ENCOUNTER — Ambulatory Visit (INDEPENDENT_AMBULATORY_CARE_PROVIDER_SITE_OTHER)
Admission: RE | Admit: 2018-05-16 | Discharge: 2018-05-16 | Disposition: A | Discharge status: Home / Self Care | Payer: BLUE CROSS/BLUE SHIELD | Source: Ambulatory Visit | Attending: Family Medicine | Admitting: Family Medicine

## 2018-05-16 ENCOUNTER — Ambulatory Visit: Payer: Self-pay

## 2018-05-16 ENCOUNTER — Encounter: Payer: Self-pay | Admitting: Family Medicine

## 2018-05-16 VITALS — BP 104/66 | HR 90 | Ht 62.0 in | Wt 178.0 lb

## 2018-05-16 DIAGNOSIS — M25562 Pain in left knee: Secondary | ICD-10-CM | POA: Diagnosis not present

## 2018-05-16 DIAGNOSIS — M1712 Unilateral primary osteoarthritis, left knee: Secondary | ICD-10-CM | POA: Diagnosis not present

## 2018-05-16 DIAGNOSIS — G8929 Other chronic pain: Secondary | ICD-10-CM

## 2018-05-16 DIAGNOSIS — M76892 Other specified enthesopathies of left lower limb, excluding foot: Secondary | ICD-10-CM | POA: Diagnosis not present

## 2018-05-16 MED ORDER — DICLOFENAC SODIUM 2 % TD SOLN: 2.0000 g | Freq: Two times a day (BID) | TRANSDERMAL | 3 refills | Status: DC

## 2018-05-16 NOTE — Assessment & Plan Note (Signed)
Patient does have more of a popliteal tendinitis and appears to have a potential tear.  We are going to focus on strengthening of the hamstrings.  Underlying arthritic changes of the knee noted as well.  Patient will trace done secondary to patient's history of breast cancer.  We discussed possible compression, topical anti-inflammatories prescribed, discussed vitamin D and other over-the-counter supplementation that could be beneficial.  Follow-up with me again in 4 weeks

## 2018-05-16 NOTE — Patient Instructions (Addendum)
Good to see you Ice is your friend Ice 20 minutes 2 times daily. Usually after activity and before bed. Exercises 3 times a week.  Xray downstairs pennsaid pinkie amount topically 2 times daily as needed.  Over the counter get Tart cherry extract any dose at night Also start vitamin D 2000 Iu daily  Try biking, elliptical or swimming instead Consider a knee compression sleeve to keep any swelling down  See me again in 4-6 weeks

## 2018-05-16 NOTE — Assessment & Plan Note (Signed)
Discussed posture and ergonomics.  May need custom bracing at some point.  Discussed icing regimen.

## 2018-06-05 ENCOUNTER — Other Ambulatory Visit: Payer: Self-pay | Admitting: *Deleted

## 2018-06-05 DIAGNOSIS — Z170 Estrogen receptor positive status [ER+]: Principal | ICD-10-CM

## 2018-06-05 DIAGNOSIS — C50212 Malignant neoplasm of upper-inner quadrant of left female breast: Secondary | ICD-10-CM

## 2018-06-05 NOTE — Progress Notes (Signed)
[facility]  [phone] [phone]     ID: Felicia Hampton DOB: Jun 22, 1956  MR#: [id number]  XIP#:[id number]  Patient Care Team: Bernerd Limbo, MD as PCP - General (Family Medicine) Lyndzie Zentz, Virgie Dad, MD as Consulting Physician (Oncology) Rolm Bookbinder, MD as Consulting Physician (General Surgery) Maisie Fus, MD as Consulting Physician (Obstetrics and Gynecology) Irene Limbo, MD as Consulting Physician (Plastic Surgery) Delice Bison, Charlestine Massed, NP as Nurse Practitioner (Hematology and Oncology) Lyndal Pulley, DO as Consulting Physician (Family Medicine) OTHER MD:  CHIEF COMPLAINT: Estrogen receptor positive breast cancer  CURRENT TREATMENT: Tamoxifen   BREAST CANCER HISTORY: From the original consult note:  Felicia Hampton had routine screening mammography at Dr. Verlon Au suggesting an abnormality in her left breast upper inner quadrant. Breast density was category B. She was referred for ultrasonography which did not identify the mass. It also found the axilla to be clear sonographically. Breast MRI 07/01/2016 found no abnormalities in the right breast. In the left breast central upper section there was a 0.8 cm enhancing mass associated with architectural distortion. There was a 6 cm area of non-masslike enhancement in the upper outer quadrant of the left breast 3 cm lateral to the other lesion. There were no abnormal appearing lymph nodes.  On 07/12/2016 she underwent MRI guided core biopsies of these 2 areas in question. The final pathology (SAA [id number]) showed in the upper inner quadrant, invasive ductal carcinoma, grade 1, estrogen receptor 100% positive, progesterone receptor 100% positive, both with strong staining intensity, with an MIB-1 of 2%, and no HER-2 amplification, the signals ratio being 1.89 and the number per cell 2.65. In the upper outer quadrant biopsy there was only atypical lobular hyperplasia.  The patient's subsequent history  is as detailed below  INTERVAL HISTORY: Felicia Hampton returns today for follow-up and treatment of her estrogen receptor positive breast cancer.  The patient continues on tamoxifen, and is tolerating it well. She experiences bad hot flashes during both the day and the night. They have improved mildly since her last visit.  Since her last visit here, she underwent a Bone Density Scan on 02/02/2018, with a T-score of -1.8.   Also since her last visit here she had some knee studies which are detailed below.  Bilateral diagnostic mammography with tomography on 06/06/2018 showed Breast Center to be category B.  There was no evidence of malignancy.   REVIEW OF SYSTEMS: Felicia Hampton is doing well overall. She retired in September of 2018. She completed the [employer] program. She was walking about 7 miles a week until she began experiencing knee pain. She is currently seeing Dr. Tamala Julian at [place] for that pain, and is completing non-weight bearing exercises like stationary bike riding and yoga. She is currently taking eyedrops for dry eyes. She drinks plenty of fluids. The patient denies unusual headaches, visual changes, nausea, vomiting, or dizziness. There has been no unusual cough, phlegm production, or pleurisy. This been no change in bowel or bladder habits. The patient denies unexplained fatigue or unexplained weight loss, bleeding, rash, or fever. A detailed review of systems was otherwise noncontributory.    PAST MEDICAL HISTORY: Past Medical History:  Diagnosis Date  . Adult ADHD   . Cancer [facility]) 2018   left breast  . Family history of breast cancer   . Family history of colon cancer   . Family history of ovarian cancer   . Genetic testing 08/02/2016   Negative; No pathogenic mutations detected. STAT Breast panel  with reflex to Common Cancers panel  Genes tested were the 43 genes on [facility]'s Common Cancers panel (APC, ATM, AXIN2, BARD1, BMPR1A, BRCA1, BRCA2, BRIP1, CDH1, CDKN2A, CHEK2,  DICER1, EPCAM, GREM1, HOXB13, KIT, MEN1, MLH1, MSH2, MSH6, MUTYH, NBN, NF1, PALB2, PDGFRA, PMS2, POLD1, POLE, PTEN, RAD50, RAD51C, RAD51D, SDHA, SDHB, SDHC, SDHD  . History of radiation therapy 10/10/16-11/24/16   left breast 50.4 Gy in 28 fractions, left breast boost 12 Gy in 6 fractions  . Hypercholesteremia   . Personal history of radiation therapy   . PONV (postoperative nausea and vomiting)     PAST SURGICAL HISTORY: Past Surgical History:  Procedure Laterality Date  . ABDOMINAL HYSTERECTOMY    . BREAST LUMPECTOMY    . BREAST REDUCTION SURGERY Bilateral 08/23/2016   Procedure: BILATERAL ONCOPLASTIC BREAST REDUCTION;  Surgeon: Irene Limbo, MD;  Location: [place];  Service: Plastics;  Laterality: Bilateral;  . KNEE ARTHROSCOPY     x3  . RADIOACTIVE SEED GUIDED PARTIAL MASTECTOMY WITH AXILLARY SENTINEL LYMPH NODE BIOPSY Left 08/11/2016   Procedure: RADIOACTIVE SEED GUIDED LEFT BREAST LUMPECTOMY WITH AXILLARY SENTINEL LYMPH NODE BIOPSY;  Surgeon: Rolm Bookbinder, MD;  Location: [place];  Service: General;  Laterality: Left;  . REDUCTION MAMMAPLASTY    . SHOULDER ARTHROSCOPY W/ ROTATOR CUFF REPAIR Right     FAMILY HISTORY Family History  Problem Relation Age of Onset  . Colon cancer Sister 28       deceased 57  . Melanoma Brother        on back; currently 58  . Ovarian cancer Maternal Aunt   . Colon cancer Paternal Grandmother 102       deceased  . Breast cancer Cousin 4       mat female cousin; daughter of unaffected mat aunt; deceased 73  . Prostate cancer Father        deceased 84  The patient's father died from prostate cancer at age 21. The patient's mother died from heart disease at age 82. The patient had 5 brothers, 6 sisters. A twin sister died at the age of 71 from colon cancer. A maternal cousin had breast cancer at age 34. A maternal aunt had ovarian cancer, age at diagnosis unknown. Another maternal aunt had kidney cancer. On the  father's side A grandmother died at age 6 with colon cancer  GYNECOLOGIC HISTORY:  No LMP recorded. Patient is postmenopausal. Menarche age 71, first live birth age 72. The patient is GX P1. She Underwent hysterectomy in her late 17s and was on hormone replacement for approximately 10 years, stopping approximately 2016  SOCIAL HISTORY:  Felicia Hampton has an interesting athletic history and she swam the [place] channel to [place] and also did other long-term swims in her youth. She is originally from [place]. 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 He has children of his. The patient's son Rolla Plate lives in [place] and is in Transport planner. The patient has 1 biological granddaughter, 68 months old as of January 2018.    ADVANCED DIRECTIVES: Not in place   HEALTH MAINTENANCE: Social History   Tobacco Use  . Smoking status: Former Smoker    Packs/day: 0.50    Years: 10.00    Pack years: 5.00    Last attempt to quit: 08/02/1988    Years since quitting: 29.8  . Smokeless tobacco: Never Used  Substance Use Topics  . Alcohol use: Yes  Comment: social  . Drug use: No     Colonoscopy: July 2016  PAP: 2016  Bone density: 2015   Allergies  Allergen Reactions  . Ace Inhibitors Palpitations and Shortness Of Breath    COX-2 inhibitors  . Diclofenac     Other reaction(s): Hypotension (ALLERGY/intolerance)  . Morphine Nausea And Vomiting  . Morphine And Related Nausea And Vomiting  . Oxycodone Other (See Comments)    hallucinations    Current Outpatient Medications  Medication Sig Dispense Refill  . atorvastatin (LIPITOR) 20 MG tablet Take 20 mg by mouth daily at 6 PM.    . Cholecalciferol (VITAMIN D) 2000 units tablet Take by mouth.    . Diclofenac Sodium 2 % SOLN Place 2 g onto the skin 2 (two) times daily. 112 g 3  . Lifitegrast (XIIDRA) 5 % SOLN Apply 1 drop to eye 2 (two) times daily.    .  Multiple Vitamin (MULTIVITAMIN) capsule Take by mouth.    . tamoxifen (NOLVADEX) 20 MG tablet Take 1 tablet (20 mg total) by mouth daily. 90 tablet 4  . venlafaxine XR (EFFEXOR-XR) 75 MG 24 hr capsule Take 2 capsules (150 mg total) by mouth daily with breakfast. 30 capsule 12  . zolpidem (AMBIEN) 10 MG tablet Take 10 mg by mouth at bedtime as needed for sleep.     No current facility-administered medications for this visit.     OBJECTIVE: Middle-aged white woman in no acute distress  Vitals:   06/06/18 1314  BP: 125/84  Pulse: (!) 117  Resp: 18  Temp: 98.8 F (37.1 C)  SpO2: 98%     Body mass index is 32.7 kg/m.    ECOG FS:1 - Symptomatic but completely ambulatory  Sclerae unicteric, EOMs intact Oropharynx clear and moist No cervical or supraclavicular adenopathy Lungs no rales or rhonchi Heart regular rate and rhythm Abd soft, nontender, positive bowel sounds MSK no focal spinal tenderness, no upper extremity lymphedema Neuro: nonfocal, well oriented, appropriate affect Breasts: Status post bilateral reduction mammoplasty.  The right breast is otherwise unremarkable.  The left breast is status post lumpectomy and radiation.  There is no evidence of local recurrence.  Both axillae are benign.  LAB RESULTS:  CMP     Component Value Date/Time   NA 140 12/05/2017 1424   NA 139 06/07/2017 1344   K 4.3 12/05/2017 1424   K 3.7 06/07/2017 1344   CL 101 12/05/2017 1424   CO2 28 12/05/2017 1424   CO2 26 06/07/2017 1344   GLUCOSE 95 12/05/2017 1424   GLUCOSE 79 06/07/2017 1344   BUN 15 12/05/2017 1424   BUN 13.2 06/07/2017 1344   CREATININE 0.76 12/05/2017 1424   CREATININE 0.7 06/07/2017 1344   CALCIUM 9.9 12/05/2017 1424   CALCIUM 9.1 06/07/2017 1344   PROT 7.2 12/05/2017 1424   PROT 6.7 06/07/2017 1344   ALBUMIN 3.7 12/05/2017 1424   ALBUMIN 3.7 06/07/2017 1344   AST 27 12/05/2017 1424   AST 21 06/07/2017 1344   ALT 33 12/05/2017 1424   ALT 20 06/07/2017 1344    ALKPHOS 66 12/05/2017 1424   ALKPHOS 64 06/07/2017 1344   BILITOT <0.2 (L) 12/05/2017 1424   BILITOT 0.29 06/07/2017 1344   GFRNONAA >60 12/05/2017 1424   GFRAA >60 12/05/2017 1424    INo results found for: SPEP, UPEP  Lab Results  Component Value Date   WBC 5.7 06/06/2018   NEUTROABS 3.7 06/06/2018   HGB 13.7 06/06/2018   HCT  40.6 06/06/2018   MCV 85.1 06/06/2018   PLT 299 06/06/2018      Chemistry      Component Value Date/Time   NA 140 12/05/2017 1424   NA 139 06/07/2017 1344   K 4.3 12/05/2017 1424   K 3.7 06/07/2017 1344   CL 101 12/05/2017 1424   CO2 28 12/05/2017 1424   CO2 26 06/07/2017 1344   BUN 15 12/05/2017 1424   BUN 13.2 06/07/2017 1344   CREATININE 0.76 12/05/2017 1424   CREATININE 0.7 06/07/2017 1344      Component Value Date/Time   CALCIUM 9.9 12/05/2017 1424   CALCIUM 9.1 06/07/2017 1344   ALKPHOS 66 12/05/2017 1424   ALKPHOS 64 06/07/2017 1344   AST 27 12/05/2017 1424   AST 21 06/07/2017 1344   ALT 33 12/05/2017 1424   ALT 20 06/07/2017 1344   BILITOT <0.2 (L) 12/05/2017 1424   BILITOT 0.29 06/07/2017 1344       No results found for: LABCA2  No components found for: LABCA125  No results for input(s): INR in the last 168 hours.  Urinalysis No results found for: COLORURINE, APPEARANCEUR, LABSPEC, PHURINE, GLUCOSEU, HGBUR, BILIRUBINUR, KETONESUR, PROTEINUR, UROBILINOGEN, NITRITE, LEUKOCYTESUR   STUDIES: [place] Limited Joint Space Structures Low Left  Result Date: 05/23/2018 MSK US performed of: Left knee This study was ordered, performed, and interpreted by Charlann Boxer D.O. Knee: Patient does have arthritic changes throughout the knee. Moderate in severity. Trace effusion noted. Popliteal tendon does have a intrasubstance tear noted.   Moderate arthritic changes. Popliteal tendon tear   Dg Knee Complete 4 Views Left  Result Date: 05/17/2018 CLINICAL DATA:  LEFT knee pain for 3 weeks, no recent injury, but did have a hyperextension  injury of the LEFT knee in May with intermittent pain since, prior history of meniscal tear and repair EXAM: LEFT KNEE - COMPLETE 4+ VIEW COMPARISON:  None FINDINGS: Osseous mineralization low normal. Diffuse joint space narrowing. Scattered chondrocalcinosis. No acute fracture, dislocation, or bone destruction. No knee joint effusion. IMPRESSION: Mild scattered degenerative changes with chondrocalcinosis question CPPD. No acute abnormalities. Electronically Signed   By: Lavonia Dana M.D.   On: 05/17/2018 07:16   Mm Diag Breast Tomo Bilateral  Result Date: 06/06/2018 CLINICAL DATA:  History left breast cancer status post lumpectomy in 2018. EXAM: DIGITAL DIAGNOSTIC BILATERAL MAMMOGRAM WITH CAD AND TOMO COMPARISON:  Previous exam(s). ACR Breast Density Category b: There are scattered areas of fibroglandular density. FINDINGS: Stable lumpectomy changes are seen in the left breast. No suspicious mass or malignant type microcalcifications identified in either breast. Mammographic images were processed with CAD. IMPRESSION: No evidence of malignancy in either breast. RECOMMENDATION: Bilateral diagnostic mammogram in 1 year is recommended. I have discussed the findings and recommendations with the patient. Results were also provided in writing at the conclusion of the visit. If applicable, a reminder letter will be sent to the patient regarding the next appointment. BI-RADS CATEGORY  2: Benign. Electronically Signed   By: Lillia Mountain M.D.   On: 06/06/2018 11:02     ELIGIBLE FOR AVAILABLE RESEARCH PROTOCOL: no  ASSESSMENT: 62 y.o. [place] woman status post left breast upper inner quadrant biopsy 07/12/2016 for a clinical T1b N0, stage IA invasive ductal carcinoma, grade 1 estrogen and progesterone receptor strongly positive, HER-2 not amplified, with an MIB-1 of 2%  (a) biopsy of an area of non-masslike enhancement in the upper outer quadrant 07/12/2016 showed atypical lobular hyperplasia  (1) genetics  testing through the  [facility]'s STAT breast panel 07/21/2016 found no deleterious mutations in ATM, BRCA1, BRCA2, CDH1, CHEK2, PALB2, PTEN, STK11, TP53. There were also no deleterious mutations in the 43 genes on [facility]'s Common Cancers panel (APC, ATM, AXIN2, BARD1, BMPR1A, BRCA1, BRCA2, BRIP1, CDH1, CDKN2A, CHEK2, DICER1, EPCAM, GREM1, HOXB13, KIT, MEN1, MLH1, MSH2, MSH6, MUTYH, NBN, NF1, PALB2, PDGFRA, PMS2, POLD1, POLE, PTEN, RAD50, RAD51C, RAD51D, SDHA, SDHB, SDHC, SDHD, SMAD4, SMARCA4, STK11, TP53, TSC1, TSC2, VHL  (2)  Status post left lumpectomy and sentinel lymph node sampling 08/11/2016 for a pT1a pN0, stage IA  Invasive ductal carcinoma, grade 1, repeat HER-2 testing again negative  (a)   Pathology from bilateral mammoplastic surgery 08/23/2016 was benign (including lumpectomy site)  (3) given the l tumor size, low grade and low proliferation fraction, chemotherapy is not indicated  (4) adjuvant radiation 10/10/16 - 11/24/16 : Left Breast treated to 50.4 Gy in 28 fractions, then Boosted an additional 12 Gy in 6 fractions  (5) started tamoxifen in late June 2018, hot flashes--on Effexor  (6) osteopenia, with T score of -1.8 on bone density scan 02/02/2018  PLAN: Riot is now very close to 2 years out from definitive surgery for her breast cancer with no evidence of disease recurrence.  This is very favorable.  She is tolerating tamoxifen generally well.  She does have significant hot flashes.  These have improved, not thanks to the gabapentin which did not work for her.  She does get significant benefit from the venlafaxine.  This is also helping her deal with her husband's difficult situation.  I suggested she read "the 36-hour day"  She is interested in volunteering here and I gave her the appropriate information  She sees most of her doctors in December.  She sees her primary care doctor in April.  I am going to start seeing her in August on a yearly basis until she completes her 5  years of follow-up    Mylena Sedberry, Virgie Dad, MD  06/06/18 1:34 PM Medical Oncology and Hematology [facility] [place], [place] [address] Tel. [phone]    Fax. [phone]   I, Jacqualyn Posey am acting as a Education administrator for Chauncey Cruel, MD.   I, Lurline Del MD, have reviewed the above documentation for accuracy and completeness, and I agree with the above.

## 2018-06-06 ENCOUNTER — Inpatient Hospital Stay: Payer: BLUE CROSS/BLUE SHIELD | Attending: Oncology | Admitting: Oncology

## 2018-06-06 ENCOUNTER — Ambulatory Visit
Admission: RE | Admit: 2018-06-06 | Discharge: 2018-06-06 | Disposition: A | Discharge status: Home / Self Care | Payer: BLUE CROSS/BLUE SHIELD | Source: Ambulatory Visit | Attending: Adult Health | Admitting: Adult Health

## 2018-06-06 ENCOUNTER — Telehealth: Payer: Self-pay | Admitting: Oncology

## 2018-06-06 ENCOUNTER — Inpatient Hospital Stay: Payer: BLUE CROSS/BLUE SHIELD

## 2018-06-06 VITALS — BP 125/84 | HR 117 | Temp 98.8°F | Resp 18 | Ht 62.0 in | Wt 178.8 lb

## 2018-06-06 DIAGNOSIS — C50212 Malignant neoplasm of upper-inner quadrant of left female breast: Secondary | ICD-10-CM | POA: Diagnosis not present

## 2018-06-06 DIAGNOSIS — Z853 Personal history of malignant neoplasm of breast: Secondary | ICD-10-CM | POA: Diagnosis not present

## 2018-06-06 DIAGNOSIS — R928 Other abnormal and inconclusive findings on diagnostic imaging of breast: Secondary | ICD-10-CM | POA: Diagnosis not present

## 2018-06-06 DIAGNOSIS — Z170 Estrogen receptor positive status [ER+]: Principal | ICD-10-CM

## 2018-06-06 DIAGNOSIS — M8580 Other specified disorders of bone density and structure, unspecified site: Secondary | ICD-10-CM

## 2018-06-06 DIAGNOSIS — Z79810 Long term (current) use of selective estrogen receptor modulators (SERMs): Secondary | ICD-10-CM | POA: Diagnosis not present

## 2018-06-06 HISTORY — DX: Personal history of irradiation: Z92.3

## 2018-06-06 LAB — CBC WITH DIFFERENTIAL (CANCER CENTER ONLY)
ABS IMMATURE GRANULOCYTES: 0 10*3/uL (ref 0.00–0.07)
BASOS ABS: 0 10*3/uL (ref 0.0–0.1)
Basophils Relative: 1 %
EOS ABS: 0.1 10*3/uL (ref 0.0–0.5)
Eosinophils Relative: 1 %
HEMATOCRIT: 40.6 % (ref 36.0–46.0)
HEMOGLOBIN: 13.7 g/dL (ref 12.0–15.0)
IMMATURE GRANULOCYTES: 0 %
LYMPHS ABS: 1.6 10*3/uL (ref 0.7–4.0)
LYMPHS PCT: 28 %
MCH: 28.7 pg (ref 26.0–34.0)
MCHC: 33.7 g/dL (ref 30.0–36.0)
MCV: 85.1 fL (ref 80.0–100.0)
MONOS PCT: 7 %
Monocytes Absolute: 0.4 10*3/uL (ref 0.1–1.0)
NEUTROS PCT: 63 %
NRBC: 0 % (ref 0.0–0.2)
Neutro Abs: 3.7 10*3/uL (ref 1.7–7.7)
Platelet Count: 299 10*3/uL (ref 150–400)
RBC: 4.77 MIL/uL (ref 3.87–5.11)
RDW: 12.1 % (ref 11.5–15.5)
WBC Count: 5.7 10*3/uL (ref 4.0–10.5)

## 2018-06-06 LAB — CMP (CANCER CENTER ONLY)
ALBUMIN: 3.8 g/dL (ref 3.5–5.0)
ALK PHOS: 64 U/L (ref 38–126)
ALT: 22 U/L (ref 0–44)
ANION GAP: 10 (ref 5–15)
AST: 22 U/L (ref 15–41)
BUN: 11 mg/dL (ref 8–23)
CALCIUM: 9.5 mg/dL (ref 8.9–10.3)
CO2: 29 mmol/L (ref 22–32)
Chloride: 104 mmol/L (ref 98–111)
Creatinine: 0.74 mg/dL (ref 0.44–1.00)
GFR, Estimated: >60 mL/min (ref >60)
GLUCOSE: 141 mg/dL — AB (ref 70–99)
Potassium: 3.9 mmol/L (ref 3.5–5.1)
SODIUM: 143 mmol/L (ref 135–145)
Total Bilirubin: 0.4 mg/dL (ref 0.3–1.2)
Total Protein: 7.1 g/dL (ref 6.5–8.1)

## 2018-06-06 MED ORDER — TAMOXIFEN CITRATE 20 MG PO TABS: 20.0000 mg | ORAL_TABLET | Freq: Every day | ORAL | 4 refills | Status: DC

## 2018-06-06 MED ORDER — VENLAFAXINE HCL ER 150 MG PO CP24: 150.0000 mg | ORAL_CAPSULE | Freq: Every day | ORAL | 4 refills | Status: DC

## 2018-06-06 NOTE — Telephone Encounter (Signed)
Per 11/27 los.  Mailed calendar to patient for August 18th appt with Dr. Jana Hakim.

## 2018-06-20 NOTE — Progress Notes (Signed)
[facility] [address], [place] [address] Phone: [phone] Subjective:    Blair Promise PT, LAT, ATC, have served as Education administrator for [employer], DO.   CC: Left knee pain  GYI:[id number]  Felicia Hampton is a 62 y.o. female coming in with complaint of L knee pain.  She states that her symptoms are improving and feels about 75% improved.  However, she notes that she hasn't been doing as much of her typical activities (biking and yoga) and has tried to rest which is likely contributing to her improvement. She states that kneeling on her L knee aggravates her symptoms as well as weight-bearing activity. She notes that she had to take some Meloxicam when her symptoms flared up but has run out.  She has also run out of Pennsaid.  She intermittently uses her knee compression sleeve w/ activity.      Past Medical History:  Diagnosis Date  . Adult ADHD   . Cancer [facility]) 2018   left breast  . Family history of breast cancer   . Family history of colon cancer   . Family history of ovarian cancer   . Genetic testing 08/02/2016   Negative; No pathogenic mutations detected. STAT Breast panel with reflex to Common Cancers panel  Genes tested were the 43 genes on [facility]'s Common Cancers panel (APC, ATM, AXIN2, BARD1, BMPR1A, BRCA1, BRCA2, BRIP1, CDH1, CDKN2A, CHEK2, DICER1, EPCAM, GREM1, HOXB13, KIT, MEN1, MLH1, MSH2, MSH6, MUTYH, NBN, NF1, PALB2, PDGFRA, PMS2, POLD1, POLE, PTEN, RAD50, RAD51C, RAD51D, SDHA, SDHB, SDHC, SDHD  . History of radiation therapy 10/10/16-11/24/16   left breast 50.4 Gy in 28 fractions, left breast boost 12 Gy in 6 fractions  . Hypercholesteremia   . Personal history of radiation therapy   . PONV (postoperative nausea and vomiting)    Past Surgical History:  Procedure Laterality Date  . ABDOMINAL HYSTERECTOMY    . BREAST LUMPECTOMY    . BREAST REDUCTION SURGERY Bilateral 08/23/2016   Procedure: BILATERAL ONCOPLASTIC BREAST REDUCTION;   Surgeon: Irene Limbo, MD;  Location: [place];  Service: Plastics;  Laterality: Bilateral;  . KNEE ARTHROSCOPY     x3  . RADIOACTIVE SEED GUIDED PARTIAL MASTECTOMY WITH AXILLARY SENTINEL LYMPH NODE BIOPSY Left 08/11/2016   Procedure: RADIOACTIVE SEED GUIDED LEFT BREAST LUMPECTOMY WITH AXILLARY SENTINEL LYMPH NODE BIOPSY;  Surgeon: Rolm Bookbinder, MD;  Location: [place];  Service: General;  Laterality: Left;  . REDUCTION MAMMAPLASTY    . SHOULDER ARTHROSCOPY W/ ROTATOR CUFF REPAIR Right    Social History   Socioeconomic History  . Marital status: Single    Spouse name: Not on file  . Number of children: 1  . Years of education: Not on file  . Highest education level: Not on file  Occupational History  . Occupation: Therapist, sports  Social Needs  . Financial resource strain: Not on file  . Food insecurity:    Worry: Not on file    Inability: Not on file  . Transportation needs:    Medical: Not on file    Non-medical: Not on file  Tobacco Use  . Smoking status: Former Smoker    Packs/day: 0.50    Years: 10.00    Pack years: 5.00    Last attempt to quit: 08/02/1988    Years since quitting: 29.9  . Smokeless tobacco: Never Used  Substance and Sexual Activity  . Alcohol use: Yes    Comment: social  .  Drug use: No  . Sexual activity: Not on file  Lifestyle  . Physical activity:    Days per week: Not on file    Minutes per session: Not on file  . Stress: Not on file  Relationships  . Social connections:    Talks on phone: Not on file    Gets together: Not on file    Attends religious service: Not on file    Active member of club or organization: Not on file    Attends meetings of clubs or organizations: Not on file    Relationship status: Not on file  Other Topics Concern  . Not on file  Social History Narrative  . Not on file   Allergies  Allergen Reactions  . Ace Inhibitors Palpitations and Shortness Of Breath    COX-2 inhibitors  .  Diclofenac     Other reaction(s): Hypotension (ALLERGY/intolerance)  . Morphine Nausea And Vomiting  . Morphine And Related Nausea And Vomiting  . Oxycodone Other (See Comments)    hallucinations   Family History  Problem Relation Age of Onset  . Colon cancer Sister 31       deceased 28  . Melanoma Brother        on back; currently 48  . Ovarian cancer Maternal Aunt   . Colon cancer Paternal Grandmother 74       deceased  . Breast cancer Cousin 69       mat female cousin; daughter of unaffected mat aunt; deceased 66  . Prostate cancer Father        deceased 51     Current Outpatient Medications (Cardiovascular):  .  atorvastatin (LIPITOR) 20 MG tablet, Take 20 mg by mouth daily at 6 PM.   Current Outpatient Medications (Analgesics):  .  meloxicam (MOBIC) 15 MG tablet, Take 1 tablet (15 mg total) by mouth daily.   Current Outpatient Medications (Other):  Marland Kitchen  Cholecalciferol (VITAMIN D) 2000 units tablet, Take by mouth. .  Diclofenac Sodium 2 % SOLN, Place 2 g onto the skin 2 (two) times daily. Marland Kitchen  Lifitegrast (XIIDRA) 5 % SOLN, Apply 1 drop to eye 2 (two) times daily. .  Multiple Vitamin (MULTIVITAMIN) capsule, Take by mouth. .  tamoxifen (NOLVADEX) 20 MG tablet, Take 1 tablet (20 mg total) by mouth daily. Marland Kitchen  venlafaxine XR (EFFEXOR-XR) 150 MG 24 hr capsule, Take 1 capsule (150 mg total) by mouth daily with breakfast. .  zolpidem (AMBIEN) 10 MG tablet, Take 10 mg by mouth at bedtime as needed for sleep.    Past medical history, social, surgical and family history all reviewed in electronic medical record.  No pertanent information unless stated regarding to the chief complaint.   Review of Systems:  No headache, visual changes, nausea, vomiting, diarrhea, constipation, dizziness, abdominal pain, skin rash, fevers, chills, night sweats, weight loss, swollen lymph nodes, body aches, joint swelling,  chest pain, shortness of breath, mood changes.  Positive muscle  aches  Objective  Blood pressure 106/70, pulse 89, height _0  (1.575 m), weight 185 lb 12.8 oz (84.3 kg), SpO2 97 %.    General: No apparent distress alert and oriented x3 mood and affect normal, dressed appropriately.  HEENT: Pupils equal, extraocular movements intact  Respiratory: Patient's speak in full sentences and does not appear short of breath  Cardiovascular: No lower extremity edema, non tender, no erythema  Skin: Warm dry intact with no signs of infection or rash on extremities or on axial skeleton.  Abdomen: Soft moderately distended Neuro: Cranial nerves II through XII are intact, neurovascularly intact in all extremities with 2+ DTRs and 2+ pulses.  Lymph: No lymphadenopathy of posterior or anterior cervical chain or axillae bilaterally.  Gait normal with good balance and coordination.  MSK:  Non tender with full range of motion and good stability and symmetric strength and tone of shoulders, elbows, wrist, hip, and ankles bilaterally.  Left knee pain noted.  Patient still has some tenderness over the medial aspect of the knee.  Mild crepitus noted.  Improvement though with range of motion from previous exam.  No true findings of any effusion at the moment.     Impression and Recommendations:     . The above documentation has been reviewed and is accurate and complete Lyndal Pulley, DO       Note: This dictation was prepared with Dragon dictation along with smaller phrase technology. Any transcriptional errors that result from this process are unintentional.

## 2018-06-21 ENCOUNTER — Ambulatory Visit: Payer: BLUE CROSS/BLUE SHIELD | Admitting: Family Medicine

## 2018-06-21 ENCOUNTER — Encounter: Payer: Self-pay | Admitting: Family Medicine

## 2018-06-21 DIAGNOSIS — M76892 Other specified enthesopathies of left lower limb, excluding foot: Secondary | ICD-10-CM

## 2018-06-21 DIAGNOSIS — M1712 Unilateral primary osteoarthritis, left knee: Secondary | ICD-10-CM

## 2018-06-21 MED ORDER — MELOXICAM 15 MG PO TABS: 15.0000 mg | ORAL_TABLET | Freq: Every day | ORAL | 0 refills | Status: DC

## 2018-06-21 NOTE — Assessment & Plan Note (Signed)
Moderate OA with CPPD. Doing well overall mild instabiltiy Discussed HEP, icing which activities to do and which ones to avoid.  RTC in

## 2018-06-21 NOTE — Assessment & Plan Note (Signed)
Continue HEP, discussed compression sleeve.  Discussed avoiding hyperextension

## 2018-06-21 NOTE — Patient Instructions (Addendum)
Great to see you  Felicia Hampton is your friend Happy holidays!  Stay active and continue the exercises 2-3 times a week  Meloxicam daily for 3 days when flare See em again in 6-8 weeks if not perfect

## 2018-06-27 DIAGNOSIS — Z6832 Body mass index (BMI) 32.0-32.9, adult: Secondary | ICD-10-CM | POA: Diagnosis not present

## 2018-06-27 DIAGNOSIS — Z01419 Encounter for gynecological examination (general) (routine) without abnormal findings: Secondary | ICD-10-CM | POA: Diagnosis not present

## 2018-06-29 DIAGNOSIS — L821 Other seborrheic keratosis: Secondary | ICD-10-CM | POA: Diagnosis not present

## 2018-06-29 DIAGNOSIS — C50412 Malignant neoplasm of upper-outer quadrant of left female breast: Secondary | ICD-10-CM | POA: Diagnosis not present

## 2018-06-29 DIAGNOSIS — Z85828 Personal history of other malignant neoplasm of skin: Secondary | ICD-10-CM | POA: Diagnosis not present

## 2018-07-16 ENCOUNTER — Other Ambulatory Visit: Payer: Self-pay | Admitting: *Deleted

## 2018-07-19 ENCOUNTER — Other Ambulatory Visit: Payer: Self-pay | Admitting: Oncology

## 2018-07-20 ENCOUNTER — Other Ambulatory Visit: Payer: Self-pay | Admitting: *Deleted

## 2018-08-06 ENCOUNTER — Ambulatory Visit: Payer: BLUE CROSS/BLUE SHIELD | Admitting: Family Medicine

## 2018-08-16 ENCOUNTER — Other Ambulatory Visit: Payer: Self-pay | Admitting: Oncology

## 2018-09-16 ENCOUNTER — Other Ambulatory Visit: Payer: Self-pay | Admitting: Family Medicine

## 2018-11-15 ENCOUNTER — Other Ambulatory Visit: Payer: Self-pay | Admitting: Oncology

## 2018-12-13 ENCOUNTER — Other Ambulatory Visit: Payer: Self-pay | Admitting: Oncology

## 2019-02-24 NOTE — Progress Notes (Signed)
Felicia Hampton  [phone] [phone]     ID: Felicia Hampton DOB: [birth year]  MR#: [id number]  MCN#:[id number]  Patient Care Team: Bernerd Limbo, MD as PCP - General (Family Medicine) Kassia Demarinis, Virgie Dad, MD as Consulting Physician (Oncology) Rolm Bookbinder, MD as Consulting Physician (General Surgery) Maisie Fus, MD as Consulting Physician (Obstetrics and Gynecology) Irene Limbo, MD as Consulting Physician (Plastic Surgery) Delice Bison, Charlestine Massed, NP as Nurse Practitioner (Hematology and Oncology) Lyndal Pulley, DO as Consulting Physician (Family Medicine) OTHER MD:  CHIEF COMPLAINT: Estrogen receptor positive breast cancer  CURRENT TREATMENT: Tamoxifen   INTERVAL HISTORY: Felicia Hampton returns today for follow-up and treatment of her estrogen receptor positive breast cancer.  The patient continues on tamoxifen, and is tolerating it well except for hot flashes which she says occur 4 or 5 times a day and are "out of this world" even though she is on venlafaxine, which she says does help significantly.  Vaginal wetness is not an issue  Since her last visit, she has not undergone any additional studies. Her most recent mammogram was performed at [facility] on 06/06/2018.   REVIEW OF SYSTEMS: Aleathia tells me her husband now is needing 24-hour support because of seizures and other problems.  This really limits how much she can do outside of the house and she has not been able to walk as much as she would like to.  Of course the gyms are closed.  She does not have any equipment at home.  Aside from this a detailed review of systems today was entirely stable   BREAST CANCER HISTORY: From the original consult note:  Lorrane had routine screening mammography at Dr. Verlon Au suggesting an abnormality in her left breast upper inner quadrant. Breast density was category B. She was referred for ultrasonography which did not identify the mass.  It also found the axilla to be clear sonographically. Breast MRI 07/01/2016 found no abnormalities in the right breast. In the left breast central upper section there was a 0.8 cm enhancing mass associated with architectural distortion. There was a 6 cm area of non-masslike enhancement in the upper outer quadrant of the left breast 3 cm lateral to the other lesion. There were no abnormal appearing lymph nodes.  On 07/12/2016 she underwent MRI guided core biopsies of these 2 areas in question. The final pathology (SAA [id number]) showed in the upper inner quadrant, invasive ductal carcinoma, grade 1, estrogen receptor 100% positive, progesterone receptor 100% positive, both with strong staining intensity, with an MIB-1 of 2%, and no HER-2 amplification, the signals ratio being 1.89 and the number per cell 2.65. In the upper outer quadrant biopsy there was only atypical lobular hyperplasia.  The patient's subsequent history is as detailed below   PAST MEDICAL HISTORY: Past Medical History:  Diagnosis Date   Adult ADHD    Cancer ([place]) 2018   left breast   Family history of breast cancer    Family history of colon cancer    Family history of ovarian cancer    Genetic testing 08/02/2016   Negative; No pathogenic mutations detected. STAT Breast panel with reflex to Common Cancers panel  Genes tested were the 43 genes on [facility]'s Common Cancers panel (APC, ATM, AXIN2, BARD1, BMPR1A, BRCA1, BRCA2, BRIP1, CDH1, CDKN2A, CHEK2, DICER1, EPCAM, GREM1, HOXB13, KIT, MEN1, MLH1, MSH2, MSH6, MUTYH, NBN, NF1, PALB2, PDGFRA, PMS2, POLD1, POLE, PTEN, RAD50, RAD51C, RAD51D, SDHA, SDHB, SDHC, SDHD   History of radiation therapy  10/10/16-11/24/16   left breast 50.4 Gy in 28 fractions, left breast boost 12 Gy in 6 fractions   Hypercholesteremia    Personal history of radiation therapy    PONV (postoperative nausea and vomiting)     PAST SURGICAL HISTORY: Past Surgical History:  Procedure Laterality Date    ABDOMINAL HYSTERECTOMY     BREAST LUMPECTOMY     BREAST REDUCTION SURGERY Bilateral 08/23/2016   Procedure: BILATERAL ONCOPLASTIC BREAST REDUCTION;  Surgeon: Irene Limbo, MD;  Location: [facility];  Service: Plastics;  Laterality: Bilateral;   KNEE ARTHROSCOPY     x3   RADIOACTIVE SEED GUIDED PARTIAL MASTECTOMY WITH AXILLARY SENTINEL LYMPH NODE BIOPSY Left 08/11/2016   Procedure: RADIOACTIVE SEED GUIDED LEFT BREAST LUMPECTOMY WITH AXILLARY SENTINEL LYMPH NODE BIOPSY;  Surgeon: Rolm Bookbinder, MD;  Location: [facility];  Service: General;  Laterality: Left;   REDUCTION MAMMAPLASTY     SHOULDER ARTHROSCOPY W/ ROTATOR CUFF REPAIR Right     FAMILY HISTORY Family History  Problem Relation Age of Onset   Colon cancer Sister 78       deceased 34   Melanoma Brother        on back; currently 53   Ovarian cancer Maternal Aunt    Colon cancer Paternal Grandmother 96       deceased   Breast cancer Cousin 75       mat female cousin; daughter of unaffected mat aunt; deceased 1   Prostate cancer Father        deceased 84  The patient's father died from prostate cancer at age 66. The patient's mother died from heart disease at age 38. The patient had 5 brothers, 6 sisters. A twin sister died at the age of 55 from colon cancer. A maternal cousin had breast cancer at age 41. A maternal aunt had ovarian cancer, age at diagnosis unknown. Another maternal aunt had kidney cancer. On the father's side A grandmother died at age 52 with colon cancer   GYNECOLOGIC HISTORY:  No LMP recorded. Patient is postmenopausal. Menarche age 63, first live birth age 63. The patient is GX P1. She Underwent hysterectomy in her late 54s and was on hormone replacement for approximately 10 years, stopping approximately 2016   SOCIAL HISTORY:  Sanye has an interesting athletic history and she swam the [place] channel to [place] and also did other long-term swims in her youth.  She is originally from [place]. 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 He has children of his. The patient's son Rolla Plate lives in [place] and is in Transport planner. The patient has 1 biological granddaughter, 8 months old as of January 2018.    ADVANCED DIRECTIVES: Not in place   HEALTH MAINTENANCE: Social History   Tobacco Use   Smoking status: Former Smoker    Packs/day: 0.50    Years: 10.00    Pack years: 5.00    Quit date: 08/02/1988    Years since quitting: 30.5   Smokeless tobacco: Never Used  Substance Use Topics   Alcohol use: Yes    Comment: social   Drug use: No     Colonoscopy: July 2016  PAP: 2016  Bone density: 2015   Allergies  Allergen Reactions   Ace Inhibitors Palpitations and Shortness Of Breath    COX-2 inhibitors   Diclofenac     Other reaction(s): Hypotension (ALLERGY/intolerance)   Morphine Nausea And Vomiting  Morphine And Related Nausea And Vomiting   Oxycodone Other (See Comments)    hallucinations    Current Outpatient Medications  Medication Sig Dispense Refill   atorvastatin (LIPITOR) 20 MG tablet Take 20 mg by mouth daily at 6 PM.     Cholecalciferol (VITAMIN D) 2000 units tablet Take by mouth.     cloNIDine (CATAPRES-TTS-1) 0.1 mg/24hr patch Place 1 patch (0.1 mg total) onto the skin once a week. 4 patch 12   Lifitegrast (XIIDRA) 5 % SOLN Apply 1 drop to eye 2 (two) times daily.     meloxicam (MOBIC) 15 MG tablet TAKE 1 TABLET BY MOUTH EVERY DAY 90 tablet 0   Multiple Vitamin (MULTIVITAMIN) capsule Take by mouth.     tamoxifen (NOLVADEX) 20 MG tablet Take 1 tablet (20 mg total) by mouth daily. 90 tablet 4   venlafaxine XR (EFFEXOR-XR) 150 MG 24 hr capsule Take 1 capsule (150 mg total) by mouth daily with breakfast. 90 capsule 4   zolpidem (AMBIEN) 10 MG tablet Take 10 mg by mouth at bedtime as needed for sleep.     No current  facility-administered medications for this visit.     OBJECTIVE: Middle-aged white woman appears well  Vitals:   02/26/19 1118  BP: 133/85  Pulse: 82  Resp: 18  Temp: 97.8 F (36.6 C)  SpO2: 99%     Body mass index is 32.59 kg/m.    ECOG FS:1 - Symptomatic but completely ambulatory  Sclerae unicteric, EOMs intact Wearing a mask No cervical or supraclavicular adenopathy Lungs no rales or rhonchi Heart regular rate and rhythm Abd soft, nontender, positive bowel sounds MSK no focal spinal tenderness, no upper extremity lymphedema Neuro: nonfocal, well oriented, appropriate affect Breasts: The right breast is status post reduction mammoplasty.  It is otherwise unremarkable.  The left breast is status post lumpectomy and radiation as well as reduction mammoplasty.  There is no evidence of disease recurrence.  Both axillae are benign.   LAB RESULTS:  CMP     Component Value Date/Time   NA 143 06/06/2018 1257   NA 139 06/07/2017 1344   K 3.9 06/06/2018 1257   K 3.7 06/07/2017 1344   CL 104 06/06/2018 1257   CO2 29 06/06/2018 1257   CO2 26 06/07/2017 1344   GLUCOSE 141 (H) 06/06/2018 1257   GLUCOSE 79 06/07/2017 1344   BUN 11 06/06/2018 1257   BUN 13.2 06/07/2017 1344   CREATININE 0.74 06/06/2018 1257   CREATININE 0.7 06/07/2017 1344   CALCIUM 9.5 06/06/2018 1257   CALCIUM 9.1 06/07/2017 1344   PROT 7.1 06/06/2018 1257   PROT 6.7 06/07/2017 1344   ALBUMIN 3.8 06/06/2018 1257   ALBUMIN 3.7 06/07/2017 1344   AST 22 06/06/2018 1257   AST 21 06/07/2017 1344   ALT 22 06/06/2018 1257   ALT 20 06/07/2017 1344   ALKPHOS 64 06/06/2018 1257   ALKPHOS 64 06/07/2017 1344   BILITOT 0.4 06/06/2018 1257   BILITOT 0.29 06/07/2017 1344   GFRNONAA >60 06/06/2018 1257   GFRAA >60 06/06/2018 1257    INo results found for: SPEP, UPEP  Lab Results  Component Value Date   WBC 4.9 02/26/2019   NEUTROABS 3.1 02/26/2019   HGB 12.9 02/26/2019   HCT 39.0 02/26/2019   MCV 86.5  02/26/2019   PLT 270 02/26/2019      Chemistry      Component Value Date/Time   NA 143 06/06/2018 1257   NA 139 06/07/2017 1344  K 3.9 06/06/2018 1257   K 3.7 06/07/2017 1344   CL 104 06/06/2018 1257   CO2 29 06/06/2018 1257   CO2 26 06/07/2017 1344   BUN 11 06/06/2018 1257   BUN 13.2 06/07/2017 1344   CREATININE 0.74 06/06/2018 1257   CREATININE 0.7 06/07/2017 1344      Component Value Date/Time   CALCIUM 9.5 06/06/2018 1257   CALCIUM 9.1 06/07/2017 1344   ALKPHOS 64 06/06/2018 1257   ALKPHOS 64 06/07/2017 1344   AST 22 06/06/2018 1257   AST 21 06/07/2017 1344   ALT 22 06/06/2018 1257   ALT 20 06/07/2017 1344   BILITOT 0.4 06/06/2018 1257   BILITOT 0.29 06/07/2017 1344       No results found for: LABCA2  No components found for: LABCA125  No results for input(s): INR in the last 168 hours.  Urinalysis No results found for: COLORURINE, APPEARANCEUR, LABSPEC, PHURINE, GLUCOSEU, HGBUR, BILIRUBINUR, KETONESUR, PROTEINUR, UROBILINOGEN, NITRITE, LEUKOCYTESUR   STUDIES: No results found.   ELIGIBLE FOR AVAILABLE RESEARCH PROTOCOL: no  ASSESSMENT: 63 y.o. [place] woman status post left breast upper inner quadrant biopsy 07/12/2016 for a clinical T1b N0, stage IA invasive ductal carcinoma, grade 1 estrogen and progesterone receptor strongly positive, HER-2 not amplified, with an MIB-1 of 2%  (a) biopsy of an area of non-masslike enhancement in the upper outer quadrant 07/12/2016 showed atypical lobular hyperplasia  (1) genetics testing through the [facility]'s STAT breast panel 07/21/2016 found no deleterious mutations in ATM, BRCA1, BRCA2, CDH1, CHEK2, PALB2, PTEN, STK11, TP53. There were also no deleterious mutations in the 43 genes on [facility]'s Common Cancers panel (APC, ATM, AXIN2, BARD1, BMPR1A, BRCA1, BRCA2, BRIP1, CDH1, CDKN2A, CHEK2, DICER1, EPCAM, GREM1, HOXB13, KIT, MEN1, MLH1, MSH2, MSH6, MUTYH, NBN, NF1, PALB2, PDGFRA, PMS2, POLD1, POLE, PTEN, RAD50, RAD51C,  RAD51D, SDHA, SDHB, SDHC, SDHD, SMAD4, SMARCA4, STK11, TP53, TSC1, TSC2, VHL  (2)  Status post left lumpectomy and sentinel lymph node sampling 08/11/2016 for a pT1a pN0, stage IA  Invasive ductal carcinoma, grade 1, repeat HER-2 testing again negative  (a)   Pathology from bilateral mammoplastic surgery 08/23/2016 was benign (including lumpectomy site)  (3) given the l tumor size, low grade and low proliferation fraction, chemotherapy is not indicated  (4) adjuvant radiation 10/10/16 - 11/24/16 : Left Breast treated to 50.4 Gy in 28 fractions, then Boosted an additional 12 Gy in 6 fractions  (5) started tamoxifen in late June 2018, hot flashes--on Effexor  (6) osteopenia, with T score of -1.8 on bone density scan 02/02/2018  PLAN: Remy is now 2-1/2 years out from definitive surgery for her breast cancer with no evidence of disease recurrence.  This is very favorable.  She is tolerating tamoxifen well except for hot flashes.  She already is on maximum dose venlafaxine.  Today we discussed adding TTS-1.  She has a good understanding of the possible toxicities side effects and complications of this agent.  I went ahead and placed that in for her.  Hopefully it will help.  She will have her next mammogram late November of this year.  She will see me again in February 2021.  She will be 3 years out from surgery at that point.  She is taking appropriate pandemic precautions  She knows to call for any issue that may develop before the next visit.  Elick Aguilera, Virgie Dad, MD  02/26/19 11:39 AM Medical Oncology and Hematology [facility] [address] [place], [place] [address] Tel. [phone]  Fax. [phone]   I, Wilburn Mylar, am acting as scribe for Dr. Virgie Dad. Keenan Dimitrov.  I, Lurline Del MD, have reviewed the above documentation for accuracy and completeness, and I agree with the above.

## 2019-02-26 ENCOUNTER — Other Ambulatory Visit: Payer: Self-pay | Admitting: *Deleted

## 2019-02-26 ENCOUNTER — Other Ambulatory Visit: Payer: Self-pay

## 2019-02-26 ENCOUNTER — Inpatient Hospital Stay: Payer: Medicare HMO | Attending: Oncology

## 2019-02-26 ENCOUNTER — Inpatient Hospital Stay (HOSPITAL_BASED_OUTPATIENT_CLINIC_OR_DEPARTMENT_OTHER): Payer: Medicare HMO | Admitting: Oncology

## 2019-02-26 VITALS — BP 133/85 | HR 82 | Temp 97.8°F | Resp 18 | Wt 178.2 lb

## 2019-02-26 DIAGNOSIS — C50412 Malignant neoplasm of upper-outer quadrant of left female breast: Secondary | ICD-10-CM | POA: Diagnosis present

## 2019-02-26 DIAGNOSIS — C50212 Malignant neoplasm of upper-inner quadrant of left female breast: Secondary | ICD-10-CM

## 2019-02-26 DIAGNOSIS — M8580 Other specified disorders of bone density and structure, unspecified site: Secondary | ICD-10-CM | POA: Insufficient documentation

## 2019-02-26 DIAGNOSIS — Z170 Estrogen receptor positive status [ER+]: Secondary | ICD-10-CM

## 2019-02-26 DIAGNOSIS — N951 Menopausal and female climacteric states: Secondary | ICD-10-CM | POA: Diagnosis not present

## 2019-02-26 DIAGNOSIS — Z79810 Long term (current) use of selective estrogen receptor modulators (SERMs): Secondary | ICD-10-CM | POA: Insufficient documentation

## 2019-02-26 LAB — CBC WITH DIFFERENTIAL (CANCER CENTER ONLY)
Abs Immature Granulocytes: 0.01 10*3/uL (ref 0.00–0.07)
Basophils Absolute: 0.1 10*3/uL (ref 0.0–0.1)
Basophils Relative: 1 %
Eosinophils Absolute: 0.1 10*3/uL (ref 0.0–0.5)
Eosinophils Relative: 1 %
HCT: 39 % (ref 36.0–46.0)
Hemoglobin: 12.9 g/dL (ref 12.0–15.0)
Immature Granulocytes: 0 %
Lymphocytes Relative: 26 %
Lymphs Abs: 1.3 10*3/uL (ref 0.7–4.0)
MCH: 28.6 pg (ref 26.0–34.0)
MCHC: 33.1 g/dL (ref 30.0–36.0)
MCV: 86.5 fL (ref 80.0–100.0)
Monocytes Absolute: 0.4 10*3/uL (ref 0.1–1.0)
Monocytes Relative: 8 %
Neutro Abs: 3.1 10*3/uL (ref 1.7–7.7)
Neutrophils Relative %: 64 %
Platelet Count: 270 10*3/uL (ref 150–400)
RBC: 4.51 MIL/uL (ref 3.87–5.11)
RDW: 12.6 % (ref 11.5–15.5)
WBC Count: 4.9 10*3/uL (ref 4.0–10.5)
nRBC: 0 % (ref 0.0–0.2)

## 2019-02-26 LAB — CMP (CANCER CENTER ONLY)
ALT: 34 U/L (ref 0–44)
AST: 32 U/L (ref 15–41)
Albumin: 3.8 g/dL (ref 3.5–5.0)
Alkaline Phosphatase: 62 U/L (ref 38–126)
Anion gap: 9 (ref 5–15)
BUN: 14 mg/dL (ref 8–23)
CO2: 27 mmol/L (ref 22–32)
Calcium: 9.1 mg/dL (ref 8.9–10.3)
Chloride: 105 mmol/L (ref 98–111)
Creatinine: 0.71 mg/dL (ref 0.44–1.00)
GFR, Est AFR Am: >60 mL/min (ref >60)
GFR, Estimated: >60 mL/min (ref >60)
Glucose, Bld: 110 mg/dL — ABNORMAL HIGH (ref 70–99)
Potassium: 4.2 mmol/L (ref 3.5–5.1)
Sodium: 141 mmol/L (ref 135–145)
Total Bilirubin: 0.3 mg/dL (ref 0.3–1.2)
Total Protein: 6.7 g/dL (ref 6.5–8.1)

## 2019-02-26 MED ORDER — CLONIDINE 0.1 MG/24HR TD PTWK: 0.1000 mg | MEDICATED_PATCH | TRANSDERMAL | 12 refills | Status: DC

## 2019-02-26 MED ORDER — TAMOXIFEN CITRATE 20 MG PO TABS: 20.0000 mg | ORAL_TABLET | Freq: Every day | ORAL | 4 refills | Status: DC

## 2019-02-26 MED ORDER — VENLAFAXINE HCL ER 150 MG PO CP24: 150.0000 mg | ORAL_CAPSULE | Freq: Every day | ORAL | 4 refills | Status: DC

## 2019-02-28 ENCOUNTER — Telehealth: Payer: Self-pay | Admitting: Oncology

## 2019-02-28 NOTE — Telephone Encounter (Signed)
I talk with patient regarding schedule  

## 2019-05-29 IMAGING — DX DG KNEE COMPLETE 4+V*L*
4 series · 4 of 4 positions shown · non-contrast
Comparison: None

CLINICAL DATA: LEFT knee pain for 3 weeks, no recent injury, but
did have a hyperextension injury of the LEFT knee [REDACTED] with
intermittent pain since, prior history of meniscal tear and repair

EXAM:
LEFT KNEE - COMPLETE 4+ VIEW

[knee ap]
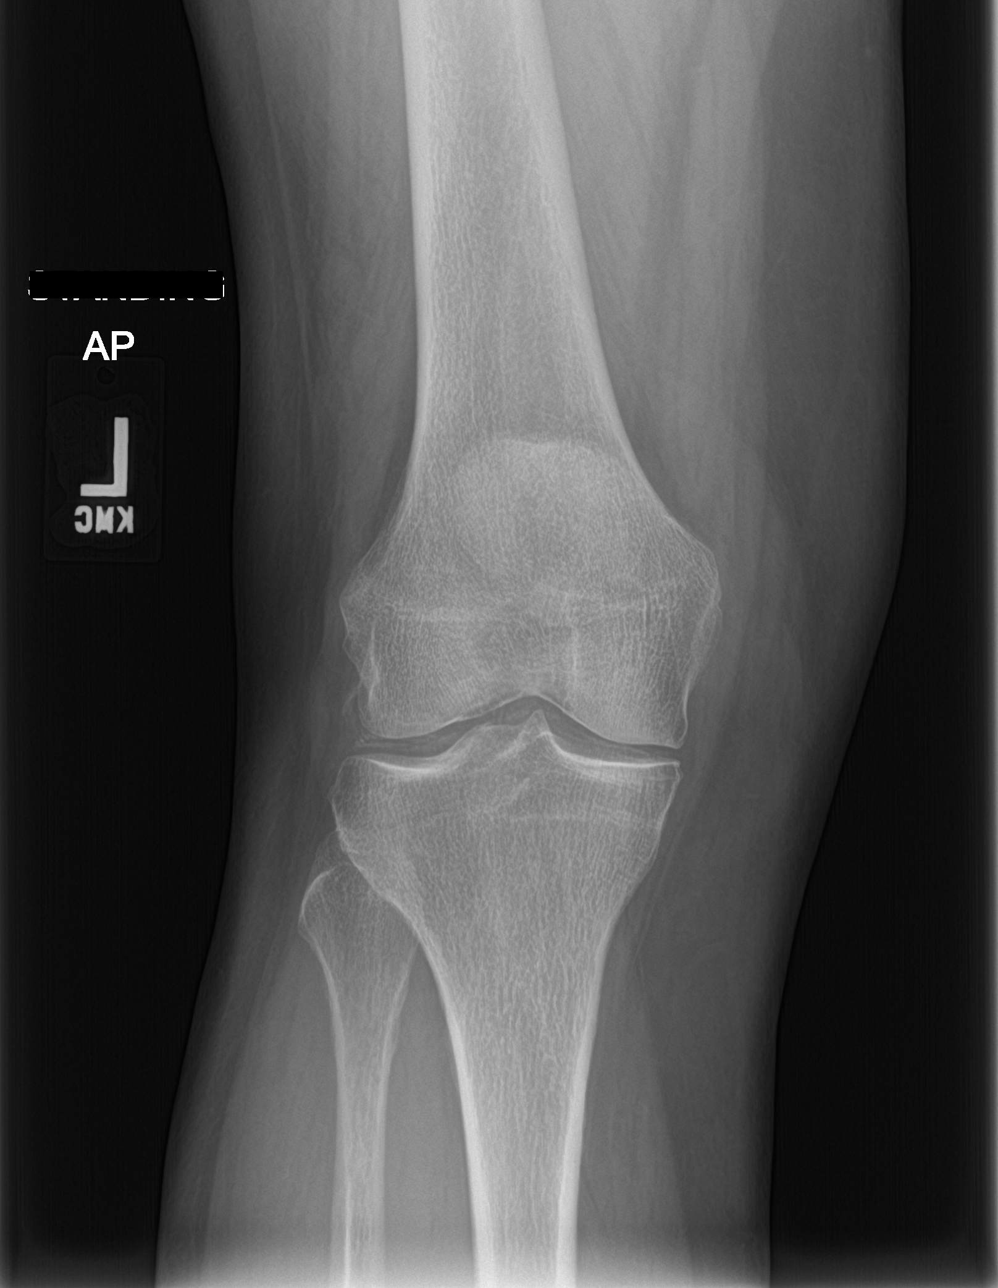

[knee tunnel]
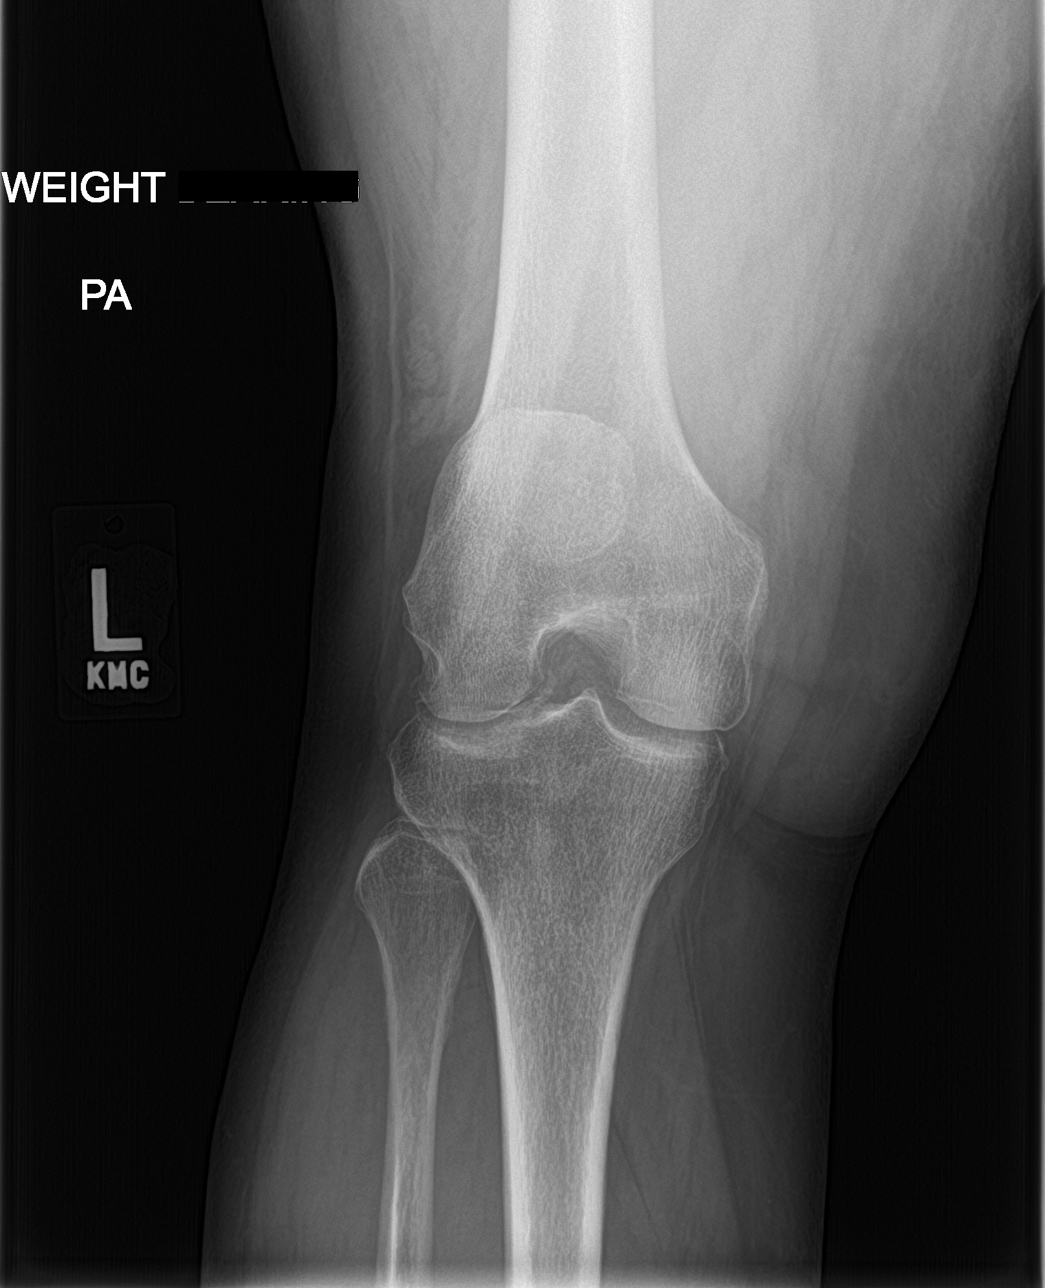

[knee lat]
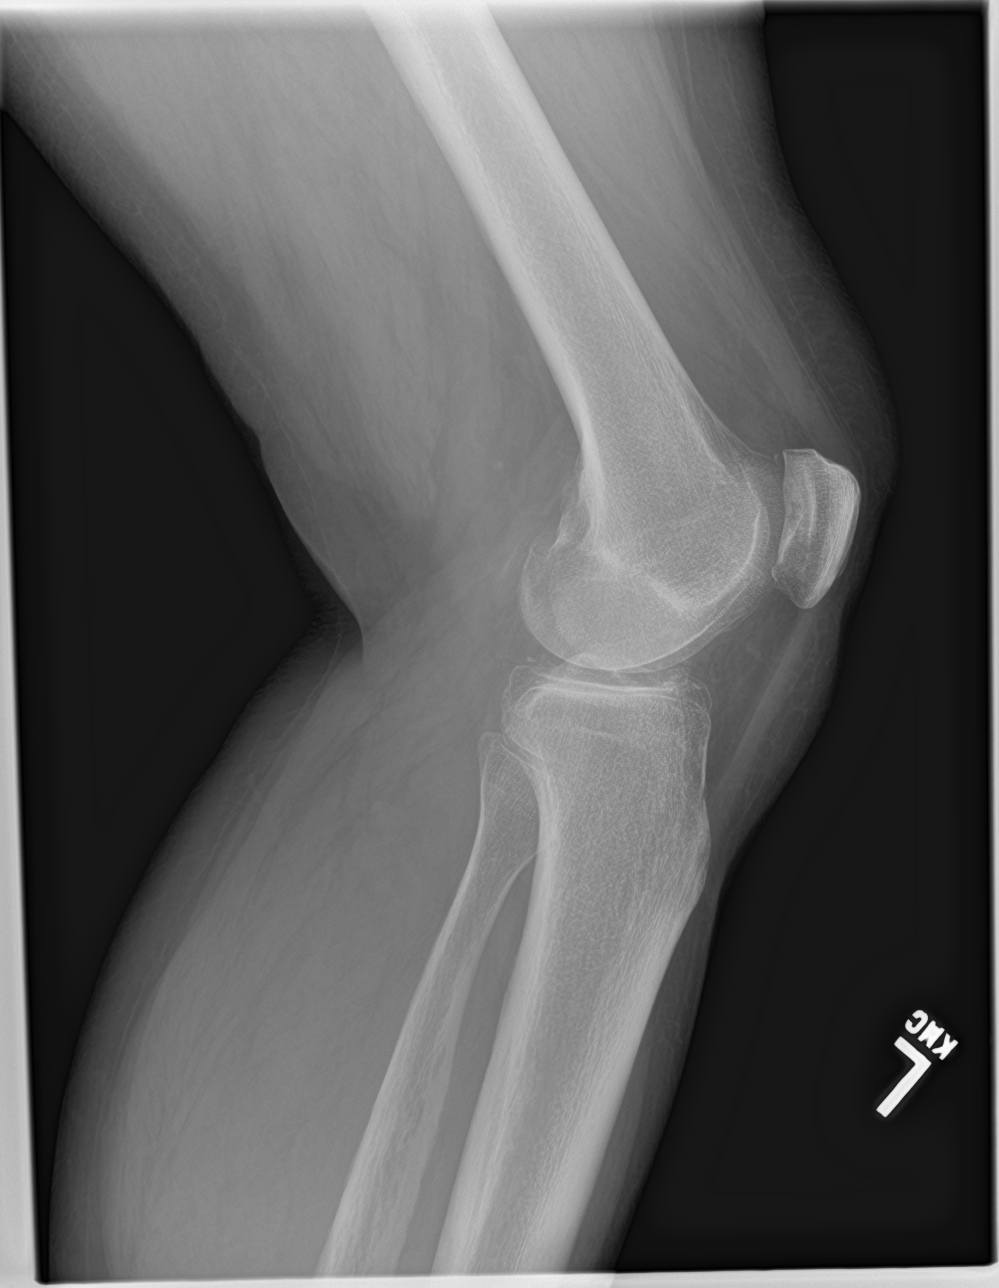

[sunrise]
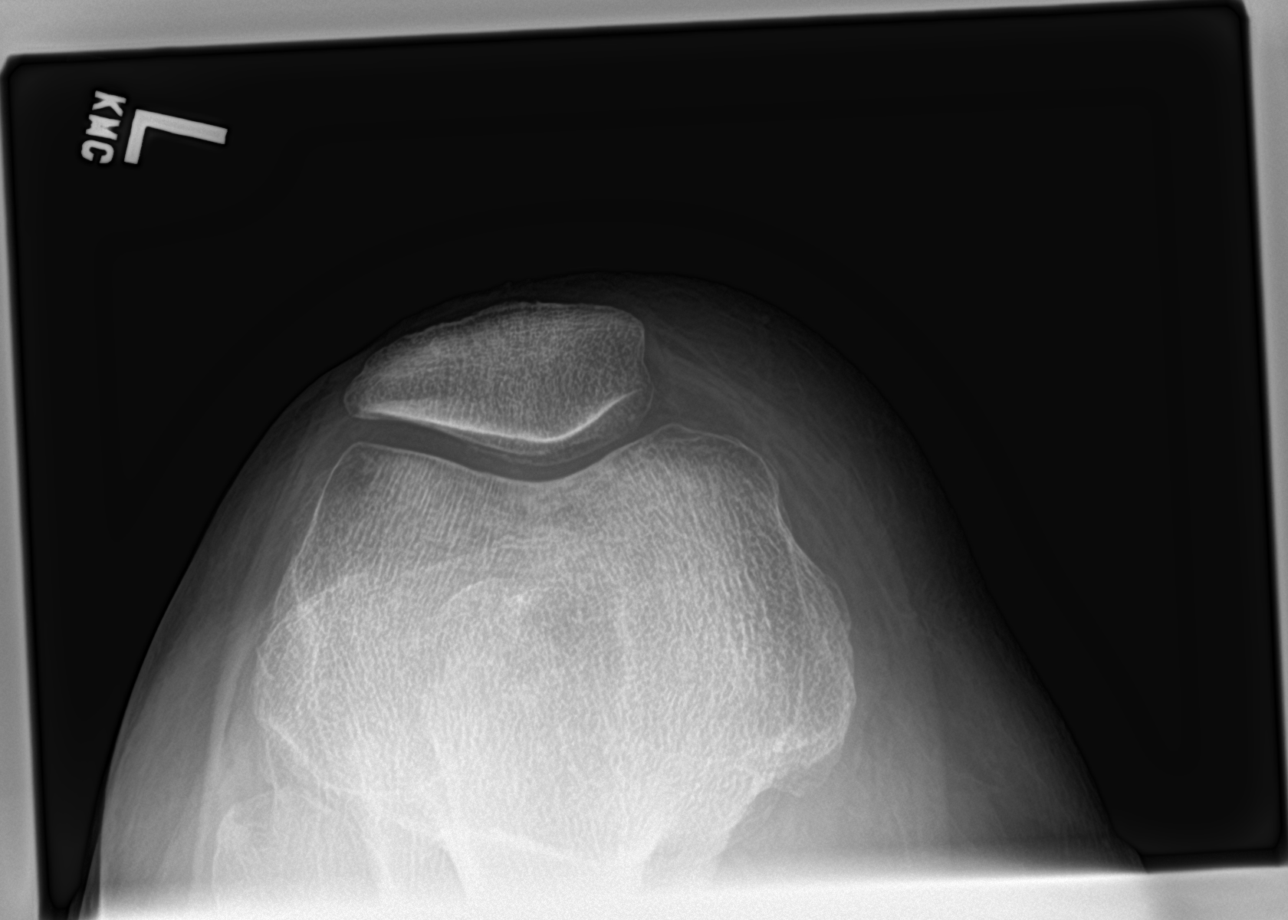

[4 of 4 positions shown; findings below may reference images not displayed]

FINDINGS: Osseous mineralization low normal.

Diffuse joint space narrowing.

Scattered chondrocalcinosis.

No acute fracture, dislocation, or bone destruction.

No knee joint effusion.
IMPRESSION: Mild scattered degenerative changes with chondrocalcinosis question
CPPD.

No acute abnormalities.

## 2019-06-10 ENCOUNTER — Ambulatory Visit
Admission: RE | Admit: 2019-06-10 | Discharge: 2019-06-10 | Disposition: A | Discharge status: Home / Self Care | Payer: Medicare HMO | Source: Ambulatory Visit | Attending: Oncology | Admitting: Oncology

## 2019-06-10 ENCOUNTER — Other Ambulatory Visit: Payer: Self-pay

## 2019-06-10 DIAGNOSIS — C50212 Malignant neoplasm of upper-inner quadrant of left female breast: Secondary | ICD-10-CM

## 2019-06-10 DIAGNOSIS — Z170 Estrogen receptor positive status [ER+]: Secondary | ICD-10-CM

## 2019-07-07 NOTE — Progress Notes (Signed)
[facility] [place] [address], [place] [address] Phone: [phone] Subjective:   I Felicia Hampton am serving as a Education administrator for Dr. Hulan Saas.  This visit occurred during the SARS-CoV-2 public health emergency.  Safety protocols were in place, including screening questions prior to the visit, additional usage of staff PPE, and extensive cleaning of exam room while observing appropriate contact time as indicated for disinfecting solutions.  CC: Left hand pain  [id number]   06/21/18 Moderate OA with CPPD. Doing well overall mild instabiltiy Discussed HEP, icing which activities to do and which ones to avoid.  RTC in   Continue HEP, discussed compression sleeve.  Discussed avoiding hyperextension   07/08/2019 Felicia Hampton is a 63 y.o. female coming in with complaint of left hand trigger finger. Patient is taking care of her husband and voices concern about her hand pain. Bilateral elbow pain as well. A lot of joint pain. Worse first thing in the morning and at night.   Onset- Chronic  Location - 1-3 and thumb, CMC  Character- tooth ache  Aggravating factors- ADL Reliving factors-  Therapies tried- splint, meloxicam  Severity-  7/10 at its worse      Past Medical History:  Diagnosis Date  . Adult ADHD   . Breast cancer ([place]) 2018   Left Breast Cancer  . Cancer [facility]) 2018   left breast  . Family history of breast cancer   . Family history of colon cancer   . Family history of ovarian cancer   . Genetic testing 08/02/2016   Negative; No pathogenic mutations detected. STAT Breast panel with reflex to Common Cancers panel  Genes tested were the 43 genes on [employer]'s Common Cancers panel (APC, ATM, AXIN2, BARD1, BMPR1A, BRCA1, BRCA2, BRIP1, CDH1, CDKN2A, CHEK2, DICER1, EPCAM, GREM1, HOXB13, KIT, MEN1, MLH1, MSH2, MSH6, MUTYH, NBN, NF1, PALB2, PDGFRA, PMS2, POLD1, POLE, PTEN, RAD50, RAD51C, RAD51D, SDHA, SDHB, SDHC, SDHD  . History of radiation  therapy 10/10/16-11/24/16   left breast 50.4 Gy in 28 fractions, left breast boost 12 Gy in 6 fractions  . Hypercholesteremia   . Personal history of radiation therapy 2018   Left Breast Cancer  . PONV (postoperative nausea and vomiting)    Past Surgical History:  Procedure Laterality Date  . ABDOMINAL HYSTERECTOMY    . BREAST LUMPECTOMY Left 08/11/2016  . BREAST REDUCTION SURGERY Bilateral 08/23/2016   Procedure: BILATERAL ONCOPLASTIC BREAST REDUCTION;  Surgeon: Irene Limbo, MD;  Location: [place];  Service: Plastics;  Laterality: Bilateral;  . KNEE ARTHROSCOPY     x3  . RADIOACTIVE SEED GUIDED PARTIAL MASTECTOMY WITH AXILLARY SENTINEL LYMPH NODE BIOPSY Left 08/11/2016   Procedure: RADIOACTIVE SEED GUIDED LEFT BREAST LUMPECTOMY WITH AXILLARY SENTINEL LYMPH NODE BIOPSY;  Surgeon: Rolm Bookbinder, MD;  Location: [place];  Service: General;  Laterality: Left;  . REDUCTION MAMMAPLASTY    . SHOULDER ARTHROSCOPY W/ ROTATOR CUFF REPAIR Right    Social History   Socioeconomic History  . Marital status: Single    Spouse name: Not on file  . Number of children: 1  . Years of education: Not on file  . Highest education level: Not on file  Occupational History  . Occupation: Therapist, sports  Tobacco Use  . Smoking status: Former Smoker    Packs/day: 0.50    Years: 10.00    Pack years: 5.00    Quit date: 08/02/1988    Years since quitting: 30.9  . Smokeless  tobacco: Never Used  Substance and Sexual Activity  . Alcohol use: Yes    Comment: social  . Drug use: No  . Sexual activity: Not on file  Other Topics Concern  . Not on file  Social History Narrative  . Not on file   Social Determinants of Health   Financial Resource Strain:   . Difficulty of Paying Living Expenses: Not on file  Food Insecurity:   . Worried About Charity fundraiser in the Last Year: Not on file  . Ran Out of Food in the Last Year: Not on file  Transportation Needs:   . Lack of  Transportation (Medical): Not on file  . Lack of Transportation (Non-Medical): Not on file  Physical Activity:   . Days of Exercise per Week: Not on file  . Minutes of Exercise per Session: Not on file  Stress:   . Feeling of Stress : Not on file  Social Connections:   . Frequency of Communication with Friends and Family: Not on file  . Frequency of Social Gatherings with Friends and Family: Not on file  . Attends Religious Services: Not on file  . Active Member of Clubs or Organizations: Not on file  . Attends Archivist Meetings: Not on file  . Marital Status: Not on file   Allergies  Allergen Reactions  . Ace Inhibitors Palpitations and Shortness Of Breath    COX-2 inhibitors  . Diclofenac     Other reaction(s): Hypotension (ALLERGY/intolerance)  . Morphine Nausea And Vomiting  . Morphine And Related Nausea And Vomiting  . Oxycodone Other (See Comments)    hallucinations   Family History  Problem Relation Age of Onset  . Colon cancer Sister 7       deceased 13  . Melanoma Brother        on back; currently 40  . Ovarian cancer Maternal Aunt   . Colon cancer Paternal Grandmother 8       deceased  . Breast cancer Cousin 85       mat female cousin; daughter of unaffected mat aunt; deceased 59  . Prostate cancer Father        deceased 26     Current Outpatient Medications (Cardiovascular):  .  atorvastatin (LIPITOR) 20 MG tablet, Take 20 mg by mouth daily at 6 PM. .  cloNIDine (CATAPRES-TTS-1) 0.1 mg/24hr patch, Place 1 patch (0.1 mg total) onto the skin once a week.   Current Outpatient Medications (Analgesics):  .  allopurinol (ZYLOPRIM) 100 MG tablet, Take 1 tablet (100 mg total) by mouth daily. .  meloxicam (MOBIC) 15 MG tablet, Take 1 tablet (15 mg total) by mouth daily.   Current Outpatient Medications (Other):  Marland Kitchen  Cholecalciferol (VITAMIN D) 2000 units tablet, Take by mouth. Marland Kitchen  Lifitegrast (XIIDRA) 5 % SOLN, Apply 1 drop to eye 2 (two) times  daily. .  Multiple Vitamin (MULTIVITAMIN) capsule, Take by mouth. .  tamoxifen (NOLVADEX) 20 MG tablet, Take 1 tablet (20 mg total) by mouth daily. Marland Kitchen  venlafaxine XR (EFFEXOR-XR) 150 MG 24 hr capsule, Take 1 capsule (150 mg total) by mouth daily with breakfast. .  zolpidem (AMBIEN) 10 MG tablet, Take 10 mg by mouth at bedtime as needed for sleep.    Past medical history, social, surgical and family history all reviewed in electronic medical record.  No pertanent information unless stated regarding to the chief complaint.   Review of Systems:  No headache, visual changes, nausea, vomiting, diarrhea,  constipation, dizziness, abdominal pain, skin rash, fevers, chills, night sweats, weight loss, swollen lymph nodes, body aches, joint swelling, muscle aches, chest pain, shortness of breath, mood changes.   Objective  Blood pressure 130/80, pulse 91, height _0  (1.575 m), weight 179 lb (81.2 kg), SpO2 98 %.    General: No apparent distress alert and oriented x3 mood and affect normal, dressed appropriately.  HEENT: Pupils equal, extraocular movements intact  Respiratory: Patient's speak in full sentences and does not appear short of breath  Cardiovascular: No lower extremity edema, non tender, no erythema  Skin: Warm dry intact with no signs of infection or rash on extremities or on axial skeleton.  Abdomen: Soft nontender  Neuro: Cranial nerves II through XII are intact, neurovascularly intact in all extremities with 2+ DTRs and 2+ pulses.  Lymph: No lymphadenopathy of posterior or anterior cervical chain or axillae bilaterally.  Gait normal with good balance and coordination.  MSK.  Mild arthritic changes of multiple joints Left hand exam shows the patient does have a tendency positive grind but also a trigger thumb noted.  Patient also has a middle finger trigger nodule noted at the A2 pulley.  Patient still has good grip strength. Mild discomfort and pain in multiple different  joints  Procedure: Real-time Ultrasound Guided Injection of left CMC joint Device: GE Logiq Q7 Ultrasound guided injection is preferred based studies that show increased duration, increased effect, greater accuracy, decreased procedural pain, increased response rate, and decreased cost with ultrasound guided versus blind injection.  Verbal informed consent obtained.  Time-out conducted.  Noted no overlying erythema, induration, or other signs of local infection.  Skin prepped in a sterile fashion.  Local anesthesia: Topical Ethyl chloride.  With sterile technique and under real time ultrasound guidance: With a 25-gauge half inch needle injected with 0.5 cc of 0.5% Marcaine and 0.5 cc of Kenalog 40 mg/mL Completed without difficulty  Pain immediately resolved suggesting accurate placement of the medication.  Advised to call if fevers/chills, erythema, induration, drainage, or persistent bleeding.  Images permanently stored and available for review in the ultrasound unit.  Impression: Technically successful ultrasound guided injection.  Procedure: Real-time Ultrasound Guided Injection of left flexor tendon sheath middle finger Device: GE Logiq Q7 Ultrasound guided injection is preferred based studies that show increased duration, increased effect, greater accuracy, decreased procedural pain, increased response rate, and decreased cost with ultrasound guided versus blind injection.  Verbal informed consent obtained.  Time-out conducted.  Noted no overlying erythema, induration, or other signs of local infection.  Skin prepped in a sterile fashion.  Local anesthesia: Topical Ethyl chloride.  With sterile technique and under real time ultrasound guidance: With a 25-gauge half inch needle injected with 0.5 cc of 0.5% Marcaine and 0.5 cc of Kenalog 40 mg/mL within the flexor tendon sheath Completed without difficulty  Pain immediately resolved suggesting accurate placement of the medication.   Advised to call if fevers/chills, erythema, induration, drainage, or persistent bleeding.  Images permanently stored and available for review in the ultrasound unit.  Impression: Technically successful ultrasound guided injection. Impression and Recommendations:     This case required medical decision making of moderate complexity. The above documentation has been reviewed and is accurate and complete Lyndal Pulley, DO       Note: This dictation was prepared with Dragon dictation along with smaller phrase technology. Any transcriptional errors that result from this process are unintentional.

## 2019-07-08 ENCOUNTER — Encounter: Payer: Self-pay | Admitting: Family Medicine

## 2019-07-08 ENCOUNTER — Other Ambulatory Visit: Payer: Self-pay

## 2019-07-08 ENCOUNTER — Ambulatory Visit: Payer: Medicare HMO | Admitting: Family Medicine

## 2019-07-08 ENCOUNTER — Ambulatory Visit (INDEPENDENT_AMBULATORY_CARE_PROVIDER_SITE_OTHER): Payer: Medicare HMO

## 2019-07-08 VITALS — BP 130/80 | HR 91 | Ht 62.0 in | Wt 179.0 lb

## 2019-07-08 DIAGNOSIS — M65312 Trigger thumb, left thumb: Secondary | ICD-10-CM

## 2019-07-08 DIAGNOSIS — M79642 Pain in left hand: Secondary | ICD-10-CM

## 2019-07-08 DIAGNOSIS — M65332 Trigger finger, left middle finger: Secondary | ICD-10-CM | POA: Diagnosis not present

## 2019-07-08 DIAGNOSIS — Z8739 Personal history of other diseases of the musculoskeletal system and connective tissue: Secondary | ICD-10-CM | POA: Insufficient documentation

## 2019-07-08 DIAGNOSIS — G8929 Other chronic pain: Secondary | ICD-10-CM | POA: Diagnosis not present

## 2019-07-08 MED ORDER — ALLOPURINOL 100 MG PO TABS: 100.0000 mg | ORAL_TABLET | Freq: Every day | ORAL | 3 refills | Status: DC

## 2019-07-08 MED ORDER — MELOXICAM 15 MG PO TABS: 15.0000 mg | ORAL_TABLET | Freq: Every day | ORAL | 0 refills | Status: DC

## 2019-07-08 NOTE — Patient Instructions (Addendum)
Allopurinol sent in 2 injections in the hand today Follow up in 6 weeks

## 2019-07-08 NOTE — Assessment & Plan Note (Signed)
Injection given, tolerated procedure well, discussed icing regimen and home exercise, which activities to do which wants to avoid.  Follow-up again in 4 to 8 weeks

## 2019-07-08 NOTE — Assessment & Plan Note (Signed)
Patient given injection, tolerated procedure well, discussed icing regimen.  Normal middle finger also given injection today.  Discussed with regular activity.  Patient is not a primary care doctor her husband [name] has been started allopurinol for the CPPD.  Follow-up with me again in 6 weeks

## 2019-07-31 NOTE — Telephone Encounter (Signed)
No entry 

## 2019-08-01 ENCOUNTER — Ambulatory Visit: Payer: Medicare HMO | Admitting: Family Medicine

## 2019-08-12 ENCOUNTER — Ambulatory Visit: Payer: Medicare HMO | Attending: Internal Medicine

## 2019-08-12 DIAGNOSIS — Z20822 Contact with and (suspected) exposure to covid-19: Secondary | ICD-10-CM

## 2019-08-13 LAB — NOVEL CORONAVIRUS, NAA: SARS-CoV-2, NAA: NOT DETECTED

## 2019-08-16 ENCOUNTER — Ambulatory Visit: Payer: Medicare HMO | Admitting: Family Medicine

## 2019-08-16 ENCOUNTER — Other Ambulatory Visit: Payer: Self-pay

## 2019-08-16 ENCOUNTER — Ambulatory Visit (INDEPENDENT_AMBULATORY_CARE_PROVIDER_SITE_OTHER): Payer: Medicare HMO

## 2019-08-16 ENCOUNTER — Encounter: Payer: Self-pay | Admitting: Family Medicine

## 2019-08-16 VITALS — BP 110/70 | HR 82 | Ht 62.0 in | Wt 180.0 lb

## 2019-08-16 DIAGNOSIS — M65312 Trigger thumb, left thumb: Secondary | ICD-10-CM

## 2019-08-16 DIAGNOSIS — G8929 Other chronic pain: Secondary | ICD-10-CM

## 2019-08-16 DIAGNOSIS — M79645 Pain in left finger(s): Secondary | ICD-10-CM | POA: Diagnosis not present

## 2019-08-16 NOTE — Patient Instructions (Signed)
So sorry for your loss.  I am around if you need me Tried the trigger thumb this time and should do well  Ice is a good idea Consider bracing at night See me again in 6 weeks if you need me

## 2019-08-16 NOTE — Assessment & Plan Note (Addendum)
Injected today again.  We talked doing very well.  Discussed icing regimen and home exercise, which activities to do which wants to avoid.  Discussed further imaging if it continues to be the problem but hopefully patient will do well follow-up again in 4 to 8 weeks chronic problem with exacerbation.  Patient also has social determinants of health with recent loss of loved 1.

## 2019-08-16 NOTE — Progress Notes (Signed)
[facility] [address] [place] Phone: [phone] Subjective:   I Felicia Hampton am serving as a Education administrator for Dr. Hulan Saas.  This visit occurred during the SARS-CoV-2 public health emergency.  Safety protocols were in place, including screening questions prior to the visit, additional usage of staff PPE, and extensive cleaning of exam room while observing appropriate contact time as indicated for disinfecting solutions.   I'm seeing this patient by the request  of:  Bernerd Limbo, MD  CC: Left thumb pain follow-up  [id number]   07/08/2019 Patient given injection, tolerated procedure well, discussed icing regimen.  Normal middle finger also given injection today.  Discussed with regular activity.  Patient is not a primary care doctor her husband [name] has been started allopurinol for the CPPD.  Follow-up with me again in 6 weeks  Injection given, tolerated procedure well, discussed icing regimen and home exercise, which activities to do which wants to avoid.  Follow-up again in 4 to 8 weeks  Update 08/16/2019 Felicia Hampton is a 64 y.o. female coming in with complaint of left thumb pain.  We injected the [facility] joint last time.  Starting to have no more of the triggering of the thumb itself.  Recently did have husband passed away who was on hospice well..  States that it can be significantly painful.  Has woken her up at night given.  More pain now in the mornings     Past Medical History:  Diagnosis Date  . Adult ADHD   . Breast cancer ([place]) 2018   Left Breast Cancer  . Cancer [facility]) 2018   left breast  . Family history of breast cancer   . Family history of colon cancer   . Family history of ovarian cancer   . Genetic testing 08/02/2016   Negative; No pathogenic mutations detected. STAT Breast panel with reflex to Common Cancers panel  Genes tested were the 43 genes on [employer]'s Common Cancers panel (APC, ATM, AXIN2, BARD1,  BMPR1A, BRCA1, BRCA2, BRIP1, CDH1, CDKN2A, CHEK2, DICER1, EPCAM, GREM1, HOXB13, KIT, MEN1, MLH1, MSH2, MSH6, MUTYH, NBN, NF1, PALB2, PDGFRA, PMS2, POLD1, POLE, PTEN, RAD50, RAD51C, RAD51D, SDHA, SDHB, SDHC, SDHD  . History of radiation therapy 10/10/16-11/24/16   left breast 50.4 Gy in 28 fractions, left breast boost 12 Gy in 6 fractions  . Hypercholesteremia   . Personal history of radiation therapy 2018   Left Breast Cancer  . PONV (postoperative nausea and vomiting)    Past Surgical History:  Procedure Laterality Date  . ABDOMINAL HYSTERECTOMY    . BREAST LUMPECTOMY Left 08/11/2016  . BREAST REDUCTION SURGERY Bilateral 08/23/2016   Procedure: BILATERAL ONCOPLASTIC BREAST REDUCTION;  Surgeon: Irene Limbo, MD;  Location: [place];  Service: Plastics;  Laterality: Bilateral;  . KNEE ARTHROSCOPY     x3  . RADIOACTIVE SEED GUIDED PARTIAL MASTECTOMY WITH AXILLARY SENTINEL LYMPH NODE BIOPSY Left 08/11/2016   Procedure: RADIOACTIVE SEED GUIDED LEFT BREAST LUMPECTOMY WITH AXILLARY SENTINEL LYMPH NODE BIOPSY;  Surgeon: Rolm Bookbinder, MD;  Location: [place];  Service: General;  Laterality: Left;  . REDUCTION MAMMAPLASTY    . SHOULDER ARTHROSCOPY W/ ROTATOR CUFF REPAIR Right    Social History   Socioeconomic History  . Marital status: Single    Spouse name: Not on file  . Number of children: 1  . Years of education: Not on file  . Highest education level: Not on file  Occupational History  .  Occupation: Therapist, sports  Tobacco Use  . Smoking status: Former Smoker    Packs/day: 0.50    Years: 10.00    Pack years: 5.00    Quit date: 08/02/1988    Years since quitting: 31.0  . Smokeless tobacco: Never Used  Substance and Sexual Activity  . Alcohol use: Yes    Comment: social  . Drug use: No  . Sexual activity: Not on file  Other Topics Concern  . Not on file  Social History Narrative  . Not on file   Social Determinants of Health   Financial Resource  Strain:   . Difficulty of Paying Living Expenses: Not on file  Food Insecurity:   . Worried About Charity fundraiser in the Last Year: Not on file  . Ran Out of Food in the Last Year: Not on file  Transportation Needs:   . Lack of Transportation (Medical): Not on file  . Lack of Transportation (Non-Medical): Not on file  Physical Activity:   . Days of Exercise per Week: Not on file  . Minutes of Exercise per Session: Not on file  Stress:   . Feeling of Stress : Not on file  Social Connections:   . Frequency of Communication with Friends and Family: Not on file  . Frequency of Social Gatherings with Friends and Family: Not on file  . Attends Religious Services: Not on file  . Active Member of Clubs or Organizations: Not on file  . Attends Archivist Meetings: Not on file  . Marital Status: Not on file   Allergies  Allergen Reactions  . Ace Inhibitors Palpitations and Shortness Of Breath    COX-2 inhibitors  . Diclofenac     Other reaction(s): Hypotension (ALLERGY/intolerance)  . Morphine Nausea And Vomiting  . Morphine And Related Nausea And Vomiting  . Oxycodone Other (See Comments)    hallucinations   Family History  Problem Relation Age of Onset  . Colon cancer Sister 64       deceased 10  . Melanoma Brother        on back; currently 69  . Ovarian cancer Maternal Aunt   . Colon cancer Paternal Grandmother 60       deceased  . Breast cancer Cousin 6       mat female cousin; daughter of unaffected mat aunt; deceased 81  . Prostate cancer Father        deceased 13     Current Outpatient Medications (Cardiovascular):  .  atorvastatin (LIPITOR) 20 MG tablet, Take 20 mg by mouth daily at 6 PM. .  cloNIDine (CATAPRES-TTS-1) 0.1 mg/24hr patch, Place 1 patch (0.1 mg total) onto the skin once a week.   Current Outpatient Medications (Analgesics):  .  allopurinol (ZYLOPRIM) 100 MG tablet, Take 1 tablet (100 mg total) by mouth daily. .  meloxicam (MOBIC) 15 MG  tablet, Take 1 tablet (15 mg total) by mouth daily.   Current Outpatient Medications (Other):  Marland Kitchen  Cholecalciferol (VITAMIN D) 2000 units tablet, Take by mouth. Marland Kitchen  Lifitegrast (XIIDRA) 5 % SOLN, Apply 1 drop to eye 2 (two) times daily. .  Multiple Vitamin (MULTIVITAMIN) capsule, Take by mouth. .  tamoxifen (NOLVADEX) 20 MG tablet, Take 1 tablet (20 mg total) by mouth daily. Marland Kitchen  venlafaxine XR (EFFEXOR-XR) 150 MG 24 hr capsule, Take 1 capsule (150 mg total) by mouth daily with breakfast. .  zolpidem (AMBIEN) 10 MG tablet, Take 10 mg by mouth at bedtime as needed  for sleep.   Reviewed prior external information including notes and imaging from  primary care provider As well as notes that were available from care everywhere and other healthcare systems.  Past medical history, social, surgical and family history all reviewed in electronic medical record.  No pertanent information unless stated regarding to the chief complaint.   Review of Systems:  No headache, visual changes, nausea, vomiting, diarrhea, constipation, dizziness, abdominal pain, skin rash, fevers, chills, night sweats, weight loss, swollen lymph nodes, body aches, joint swelling, chest pain, shortness of breath, mood changes. POSITIVE muscle aches  Objective  Blood pressure 110/70, pulse 82, height 5' 2"  (1.575 m), weight 180 lb (81.6 kg), SpO2 94 %.   General: No apparent distress alert and oriented x3 mood and affect normal, dressed appropriately.  HEENT: Pupils equal, extraocular movements intact  Respiratory: Patient's speak in full sentences and does not appear short of breath  Cardiovascular: No lower extremity edema, non tender, no erythema  Skin: Warm dry intact with no signs of infection or rash on extremities or on axial skeleton.  Abdomen: Soft nontender  Neuro: Cranial nerves II through XII are intact, neurovascularly intact in all extremities with 2+ DTRs and 2+ pulses.  Lymph: No lymphadenopathy of posterior or  anterior cervical chain or axillae bilaterally.  Gait normal with good balance and coordination.  MSK:  Non tender with full range of motion and good stability and symmetric strength and tone of shoulders, elbows, wrist, hip, knee and ankles bilaterally.  Left thumb exam shows the patient does have triggering at the A2 pulley.  Mild at the A1 pulley.  Patient though [facility] is significantly improved from previous exam with a negative grind test.  Less swelling noted as well  Procedure: Real-time Ultrasound Guided Injection of left thumb flexor tendon sheath Device: GE Logiq Q7 Ultrasound guided injection is preferred based studies that show increased duration, increased effect, greater accuracy, decreased procedural pain, increased response rate, and decreased cost with ultrasound guided versus blind injection.  Verbal informed consent obtained.  Time-out conducted.  Noted no overlying erythema, induration, or other signs of local infection.  Skin prepped in a sterile fashion.  Local anesthesia: Topical Ethyl chloride.  With sterile technique and under real time ultrasound guidance: With a 25-gauge half inch needle injected with 0.5 cc of 0.5% Marcaine and 0.5 cc of Kenalog 40 mg/mL Completed without difficulty  Pain immediately resolved suggesting accurate placement of the medication.  Advised to call if fevers/chills, erythema, induration, drainage, or persistent bleeding.  Images permanently stored and available for review in the ultrasound unit.  Impression: Technically successful ultrasound guided injection.   Impression and Recommendations:     This case required medical decision making of moderate complexity. The above documentation has been reviewed and is accurate and complete Lyndal Pulley, DO       Note: This dictation was prepared with Dragon dictation along with smaller phrase technology. Any transcriptional errors that result from this process are unintentional.

## 2019-08-25 NOTE — Progress Notes (Signed)
Felicia Hampton  [phone] Fax:([phone]) [phone]     ID: Felicia Hampton DOB: 19-Jul-1955  MR#: [id number]  IOX#:[id number]  Patient Care Team: Felicia Limbo, MD as PCP - General (Family Medicine) Felicia Hampton, Felicia Dad, MD as Consulting Physician (Oncology) Felicia Bookbinder, MD as Consulting Physician (General Surgery) Felicia Fus, MD as Consulting Physician (Obstetrics and Gynecology) Felicia Limbo, MD as Consulting Physician (Plastic Surgery) Felicia Hampton, Felicia Massed, NP as Nurse Practitioner (Hematology and Oncology) Felicia Pulley, DO as Consulting Physician (Family Medicine) OTHER MD:  CHIEF COMPLAINT: Estrogen receptor positive breast cancer  CURRENT TREATMENT: Tamoxifen   INTERVAL HISTORY:  Felicia Hampton returns today for follow-up of her estrogen receptor positive breast cancer.  She is tolerating tamoxifen generally well.  Aside from moderate hot flashes there are no significant complications or side effects  Since her last visit, she underwent bilateral diagnostic mammography with tomography at [facility] on 06/10/2019 showing: breast density category C; no evidence of malignancy in either breast.  She has been followed by Dr. Tamala Hampton in orthopedics for chronic left hand pain. She received steroid injections on 07/08/2019 and 08/16/2019.   REVIEW OF SYSTEMS: Felicia Hampton's husband Felicia Hampton died about 3 weeks ago.  This was not entirely unexpected but of course it remains a difficult blow.  She is doing the best she can, grieving appropriately, not making any major decisions regarding moves, etc., and is planning to volunteer in [place], where she has some friends, 3 days a week.  She has not been able to exercise regularly but wants to start getting back into that routine.  She is taking appropriate pandemic precautions.  A detailed review of systems today was otherwise stable.   BREAST CANCER HISTORY: From the original consult note:  Felicia Hampton had routine  screening mammography at Felicia Hampton suggesting an abnormality in her left breast upper inner quadrant. Breast density was category B. She was referred for ultrasonography which did not identify the mass. It also found the axilla to be clear sonographically. Breast MRI 07/01/2016 found no abnormalities in the right breast. In the left breast central upper section there was a 0.8 cm enhancing mass associated with architectural distortion. There was a 6 cm area of non-masslike enhancement in the upper outer quadrant of the left breast 3 cm lateral to the other lesion. There were no abnormal appearing lymph nodes.  On 07/12/2016 she underwent MRI guided core biopsies of these 2 areas in question. The final pathology (SAA [id number]) showed in the upper inner quadrant, invasive ductal carcinoma, grade 1, estrogen receptor 100% positive, progesterone receptor 100% positive, both with strong staining intensity, with an MIB-1 of 2%, and no HER-2 amplification, the signals ratio being 1.89 and the number per cell 2.65. In the upper outer quadrant biopsy there was only atypical lobular hyperplasia.  The patient's subsequent history is as detailed below   PAST MEDICAL HISTORY: Past Medical History:  Diagnosis Date  . Adult ADHD   . Breast cancer (Felicia Hampton) 2018   Left Breast Cancer  . Cancer [facility]) 2018   left breast  . Family history of breast cancer   . Family history of colon cancer   . Family history of ovarian cancer   . Genetic testing 08/02/2016   Negative; No pathogenic mutations detected. STAT Breast panel with reflex to Common Cancers panel  Genes tested were the 43 genes on [facility]'s Common Cancers panel (APC, ATM, AXIN2, BARD1, BMPR1A, BRCA1, BRCA2, BRIP1, CDH1, CDKN2A, CHEK2, DICER1, EPCAM, GREM1, HOXB13, KIT,  MEN1, MLH1, MSH2, MSH6, MUTYH, NBN, NF1, PALB2, PDGFRA, PMS2, POLD1, POLE, PTEN, RAD50, RAD51C, RAD51D, SDHA, SDHB, SDHC, SDHD  . History of radiation therapy 10/10/16-11/24/16   left breast 50.4 Gy in  28 fractions, left breast boost 12 Gy in 6 fractions  . Hypercholesteremia   . Personal history of radiation therapy 23-Sep-2016   Left Breast Cancer  . PONV (postoperative nausea and vomiting)     PAST SURGICAL HISTORY: Past Surgical History:  Procedure Laterality Date  . ABDOMINAL HYSTERECTOMY    . BREAST LUMPECTOMY Left 08/11/2016  . BREAST REDUCTION SURGERY Bilateral 08/23/2016   Procedure: BILATERAL ONCOPLASTIC BREAST REDUCTION;  Surgeon: Felicia Limbo, MD;  Location: [place];  Service: Plastics;  Laterality: Bilateral;  . KNEE ARTHROSCOPY     x3  . RADIOACTIVE SEED GUIDED PARTIAL MASTECTOMY WITH AXILLARY SENTINEL LYMPH NODE BIOPSY Left 08/11/2016   Procedure: RADIOACTIVE SEED GUIDED LEFT BREAST LUMPECTOMY WITH AXILLARY SENTINEL LYMPH NODE BIOPSY;  Surgeon: Felicia Bookbinder, MD;  Location: [facility];  Service: General;  Laterality: Left;  . REDUCTION MAMMAPLASTY    . SHOULDER ARTHROSCOPY W/ ROTATOR CUFF REPAIR Right     FAMILY HISTORY Family History  Problem Relation Age of Onset  . Colon cancer Sister 50       deceased 61  . Melanoma Brother        on back; currently 63  . Ovarian cancer Maternal Aunt   . Colon cancer Paternal Grandmother 31       deceased  . Breast cancer Cousin 70       mat female cousin; daughter of unaffected mat aunt; deceased 105  . Prostate cancer Father        deceased 35  The patient's father died from prostate cancer at age 15. The patient's mother died from heart disease at age 29. The patient had 5 brothers, 6 sisters. A twin sister died at the age of 20 from colon cancer. A maternal cousin had breast cancer at age 69. A maternal aunt had ovarian cancer, age at diagnosis unknown. Another maternal aunt had kidney cancer. On the father's side A grandmother died at age 69 with colon cancer   GYNECOLOGIC HISTORY:  No LMP recorded. Patient is postmenopausal. Menarche age 32, first live birth age 27. The patient is GX  P1. She Underwent hysterectomy in her late 22s and was on hormone replacement for approximately 10 years, stopping approximately 23-Sep-2014   SOCIAL HISTORY:  Felicia Hampton has an interesting athletic history and she swam the [place] channel to [place] and also did other long-term swims in her youth. She is originally from [place]. 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 Her husband Felicia Hampton, who passed away in early 2019/09/24, was retired from Insurance underwriter. The patient's son Rolla Plate lives in [place] and is in Transport planner. The patient has 1 biological granddaughter, 40 months old as of January 2018.    ADVANCED DIRECTIVES: Not in place   HEALTH MAINTENANCE: Social History   Tobacco Use  . Smoking status: Former Smoker    Packs/day: 0.50    Years: 10.00    Pack years: 5.00    Quit date: 08/02/1988    Years since quitting: 31.0  . Smokeless tobacco: Never Used  Substance Use Topics  . Alcohol use: Yes    Comment: social  . Drug use: No     Colonoscopy: July 2016  PAP: 2014-09-23  Bone density: 2013-09-23   Allergies  Allergen Reactions  . Ace Inhibitors Palpitations and Shortness Of Breath    COX-2 inhibitors  . Diclofenac     Other reaction(s): Hypotension (ALLERGY/intolerance)  . Morphine Nausea And Vomiting  . Morphine And Related Nausea And Vomiting  . Oxycodone Other (See Comments)    hallucinations    Current Outpatient Medications  Medication Sig Dispense Refill  . allopurinol (ZYLOPRIM) 100 MG tablet Take 1 tablet (100 mg total) by mouth daily. 30 tablet 3  . atorvastatin (LIPITOR) 20 MG tablet Take 20 mg by mouth daily at 6 PM.    . Cholecalciferol (VITAMIN D) 2000 units tablet Take by mouth.    . cloNIDine (CATAPRES-TTS-1) 0.1 mg/24hr patch Place 1 patch (0.1 mg total) onto the skin once a week. 4 patch 12  . Lifitegrast (XIIDRA) 5 % SOLN Apply 1 drop to eye 2 (two) times daily.    . meloxicam (MOBIC) 15 MG tablet Take 1 tablet (15 mg total) by mouth  daily. 90 tablet 0  . Multiple Vitamin (MULTIVITAMIN) capsule Take by mouth.    . tamoxifen (NOLVADEX) 20 MG tablet Take 1 tablet (20 mg total) by mouth daily. 90 tablet 4  . venlafaxine XR (EFFEXOR-XR) 150 MG 24 hr capsule Take 1 capsule (150 mg total) by mouth daily with breakfast. 90 capsule 4  . zolpidem (AMBIEN) 10 MG tablet Take 10 mg by mouth at bedtime as needed for sleep.     No current facility-administered medications for this visit.    OBJECTIVE: Middle-aged white woman in no acute distress  Vitals:   08/26/19 1127  BP: 131/78  Pulse: 98  Resp: 19  Temp: 97.8 F (36.6 C)  SpO2: 97%     Body mass index is 32.21 kg/m.    ECOG FS:1 - Symptomatic but completely ambulatory  Sclerae unicteric, EOMs intact Wearing a mask No cervical or supraclavicular adenopathy Lungs no rales or rhonchi Heart regular rate and rhythm Abd soft, nontender, positive bowel sounds MSK no focal spinal tenderness, no upper extremity lymphedema Neuro: nonfocal, well oriented, appropriate affect Breasts: The right breast is status post reduction mammoplasty.  It is otherwise benign.  The left breast is status post lumpectomy, radiation and also reduction mammoplasty.  There is no evidence of disease recurrence.  Both axillae are benign.  LAB RESULTS:  CMP     Component Value Date/Time   NA 142 08/26/2019 1105   NA 139 06/07/2017 1344   K 3.8 08/26/2019 1105   K 3.7 06/07/2017 1344   CL 105 08/26/2019 1105   CO2 29 08/26/2019 1105   CO2 26 06/07/2017 1344   GLUCOSE 60 (L) 08/26/2019 1105   GLUCOSE 79 06/07/2017 1344   BUN 14 08/26/2019 1105   BUN 13.2 06/07/2017 1344   CREATININE 0.71 08/26/2019 1105   CREATININE 0.71 02/26/2019 1111   CREATININE 0.7 06/07/2017 1344   CALCIUM 9.2 08/26/2019 1105   CALCIUM 9.1 06/07/2017 1344   PROT 6.9 08/26/2019 1105   PROT 6.7 06/07/2017 1344   ALBUMIN 3.7 08/26/2019 1105   ALBUMIN 3.7 06/07/2017 1344   AST 19 08/26/2019 1105   AST 32 02/26/2019  1111   AST 21 06/07/2017 1344   ALT 21 08/26/2019 1105   ALT 34 02/26/2019 1111   ALT 20 06/07/2017 1344   ALKPHOS 55 08/26/2019 1105   ALKPHOS 64 06/07/2017 1344   BILITOT <0.2 (L) 08/26/2019 1105   BILITOT 0.3 02/26/2019 1111   BILITOT 0.29 06/07/2017 1344   GFRNONAA >60 08/26/2019  [place] 02/26/2019 1111   GFRAA >60 08/26/2019 1105   GFRAA >60 02/26/2019 1111    INo results found for: SPEP, UPEP  Lab Results  Component Value Date   WBC 4.9 08/26/2019   NEUTROABS 3.1 08/26/2019   HGB 13.3 08/26/2019   HCT 39.6 08/26/2019   MCV 87.0 08/26/2019   PLT 304 08/26/2019      Chemistry      Component Value Date/Time   NA 142 08/26/2019 1105   NA 139 06/07/2017 1344   K 3.8 08/26/2019 1105   K 3.7 06/07/2017 1344   CL 105 08/26/2019 1105   CO2 29 08/26/2019 1105   CO2 26 06/07/2017 1344   BUN 14 08/26/2019 1105   BUN 13.2 06/07/2017 1344   CREATININE 0.71 08/26/2019 1105   CREATININE 0.71 02/26/2019 1111   CREATININE 0.7 06/07/2017 1344      Component Value Date/Time   CALCIUM 9.2 08/26/2019 1105   CALCIUM 9.1 06/07/2017 1344   ALKPHOS 55 08/26/2019 1105   ALKPHOS 64 06/07/2017 1344   AST 19 08/26/2019 1105   AST 32 02/26/2019 1111   AST 21 06/07/2017 1344   ALT 21 08/26/2019 1105   ALT 34 02/26/2019 1111   ALT 20 06/07/2017 1344   BILITOT <0.2 (L) 08/26/2019 1105   BILITOT 0.3 02/26/2019 1111   BILITOT 0.29 06/07/2017 1344       No results found for: LABCA2  No components found for: LABCA125  No results for input(s): INR in the last 168 hours.  Urinalysis No results found for: COLORURINE, APPEARANCEUR, LABSPEC, PHURINE, GLUCOSEU, HGBUR, BILIRUBINUR, KETONESUR, PROTEINUR, UROBILINOGEN, NITRITE, LEUKOCYTESUR   STUDIES: [place] LIMITED JOINT SPACE STRUCTURES UP LEFT  Result Date: 08/25/2019 Procedure: Real-time Ultrasound Guided Injection of left thumb flexor tendon sheath Device: GE Logiq Q7 Ultrasound guided injection is preferred based  studies that show increased duration, increased effect, greater accuracy, decreased procedural pain, increased response rate, and decreased cost with ultrasound guided versus blind injection. Verbal informed consent obtained. Time-out conducted. Noted no overlying erythema, induration, or other signs of local infection. Skin prepped in a sterile fashion. Local anesthesia: Topical Ethyl chloride. With sterile technique and under real time ultrasound guidance: With a 25-gauge half inch needle injected with 0.5 cc of 0.5% Marcaine and 0.5 cc of Kenalog 40 mg/mL Completed without difficulty Pain immediately resolved suggesting accurate placement of the medication. Advised to call if fevers/chills, erythema, induration, drainage, or persistent bleeding. Images permanently stored and available for review in the ultrasound unit. Impression: Technically successful ultrasound guided injection.    ELIGIBLE FOR AVAILABLE RESEARCH PROTOCOL: no  ASSESSMENT: 64 y.o. [place] woman status post left breast upper inner quadrant biopsy 07/12/2016 for a clinical T1b N0, stage IA invasive ductal carcinoma, grade 1 estrogen and progesterone receptor strongly positive, HER-2 not amplified, with an MIB-1 of 2%  (a) biopsy of an area of non-masslike enhancement in the upper outer quadrant 07/12/2016 showed atypical lobular hyperplasia  (1) genetics testing through the [facility]'s STAT breast panel 07/21/2016 found no deleterious mutations in ATM, BRCA1, BRCA2, CDH1, CHEK2, PALB2, PTEN, STK11, TP53. There were also no deleterious mutations in the 43 genes on [facility]'s Common Cancers panel (APC, ATM, AXIN2, BARD1, BMPR1A, BRCA1, BRCA2, BRIP1, CDH1, CDKN2A, CHEK2, DICER1, EPCAM, GREM1, HOXB13, KIT, MEN1, MLH1, MSH2, MSH6, MUTYH, NBN, NF1, PALB2, PDGFRA, PMS2, POLD1, POLE, PTEN, RAD50, RAD51C, RAD51D, SDHA, SDHB, SDHC, SDHD, SMAD4, SMARCA4, STK11, TP53, TSC1, TSC2, VHL  (2)  Status post left lumpectomy  and sentinel lymph node  sampling 08/11/2016 for a pT1a pN0, stage IA  Invasive ductal carcinoma, grade 1, repeat HER-2 testing again negative  (a)   Pathology from bilateral mammoplastic surgery 08/23/2016 was benign (including lumpectomy site)  (3) given the tumor size, low grade and low proliferation fraction, chemotherapy is not indicated  (4) adjuvant radiation 10/10/16 - 11/24/16 : Left Breast treated to 50.4 Gy in 28 fractions, then Boosted an additional 12 Gy in 6 fractions  (5) started tamoxifen June 2018  (6) osteopenia, with T score of -1.8 on bone density scan 02/02/2018   PLAN: Hoa is now 3 years out from definitive surgery for her breast cancer with no evidence of disease recurrence.  This is very favorable.  I offered her our sympathy on her husband's loss.  We discussed what happens next and she has very appropriate plans, making some minor changes in the house but not planning a major move or any other major decisions.  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 [phone]    Fax. [phone]   I, Wilburn Mylar, am acting as scribe for Dr. Virgie Hampton. Mikhaela Zaugg.  I, Lurline Del MD, have reviewed the above documentation for accuracy and completeness, and I agree with the above.   *Total Encounter Time as defined by the Centers for Medicare and Medicaid Services includes, in addition to the face-to-face time of a patient visit (documented in the  note above) non-face-to-face time: obtaining and reviewing outside history, ordering and reviewing medications, tests or procedures, care coordination (communications with other health care professionals or caregivers) and documentation in the medical record.

## 2019-08-26 ENCOUNTER — Telehealth: Payer: Self-pay | Admitting: Oncology

## 2019-08-26 ENCOUNTER — Inpatient Hospital Stay: Payer: Medicare HMO | Attending: Oncology | Admitting: Oncology

## 2019-08-26 ENCOUNTER — Other Ambulatory Visit: Payer: Self-pay

## 2019-08-26 ENCOUNTER — Inpatient Hospital Stay: Payer: Medicare HMO

## 2019-08-26 VITALS — BP 131/78 | HR 98 | Temp 97.8°F | Resp 19 | Ht 62.0 in | Wt 176.1 lb

## 2019-08-26 DIAGNOSIS — M8580 Other specified disorders of bone density and structure, unspecified site: Secondary | ICD-10-CM | POA: Diagnosis not present

## 2019-08-26 DIAGNOSIS — C50412 Malignant neoplasm of upper-outer quadrant of left female breast: Secondary | ICD-10-CM | POA: Diagnosis not present

## 2019-08-26 DIAGNOSIS — Z170 Estrogen receptor positive status [ER+]: Secondary | ICD-10-CM

## 2019-08-26 DIAGNOSIS — Z8739 Personal history of other diseases of the musculoskeletal system and connective tissue: Secondary | ICD-10-CM | POA: Diagnosis not present

## 2019-08-26 DIAGNOSIS — M1712 Unilateral primary osteoarthritis, left knee: Secondary | ICD-10-CM | POA: Diagnosis not present

## 2019-08-26 DIAGNOSIS — Z79810 Long term (current) use of selective estrogen receptor modulators (SERMs): Secondary | ICD-10-CM | POA: Diagnosis not present

## 2019-08-26 DIAGNOSIS — C50212 Malignant neoplasm of upper-inner quadrant of left female breast: Secondary | ICD-10-CM | POA: Diagnosis not present

## 2019-08-26 LAB — COMPREHENSIVE METABOLIC PANEL
ALT: 21 U/L (ref 0–44)
AST: 19 U/L (ref 15–41)
Albumin: 3.7 g/dL (ref 3.5–5.0)
Alkaline Phosphatase: 55 U/L (ref 38–126)
Anion gap: 8 (ref 5–15)
BUN: 14 mg/dL (ref 8–23)
CO2: 29 mmol/L (ref 22–32)
Calcium: 9.2 mg/dL (ref 8.9–10.3)
Chloride: 105 mmol/L (ref 98–111)
Creatinine, Ser: 0.71 mg/dL (ref 0.44–1.00)
GFR calc Af Amer: >60 mL/min (ref >60)
GFR calc non Af Amer: >60 mL/min (ref >60)
Glucose, Bld: 60 mg/dL — ABNORMAL LOW (ref 70–99)
Potassium: 3.8 mmol/L (ref 3.5–5.1)
Sodium: 142 mmol/L (ref 135–145)
Total Bilirubin: <0.2 mg/dL — ABNORMAL LOW (ref 0.3–1.2)
Total Protein: 6.9 g/dL (ref 6.5–8.1)

## 2019-08-26 LAB — CBC WITH DIFFERENTIAL/PLATELET
Abs Immature Granulocytes: 0.02 10*3/uL (ref 0.00–0.07)
Basophils Absolute: 0.1 10*3/uL (ref 0.0–0.1)
Basophils Relative: 1 %
Eosinophils Absolute: 0.1 10*3/uL (ref 0.0–0.5)
Eosinophils Relative: 1 %
HCT: 39.6 % (ref 36.0–46.0)
Hemoglobin: 13.3 g/dL (ref 12.0–15.0)
Immature Granulocytes: 0 %
Lymphocytes Relative: 26 %
Lymphs Abs: 1.3 10*3/uL (ref 0.7–4.0)
MCH: 29.2 pg (ref 26.0–34.0)
MCHC: 33.6 g/dL (ref 30.0–36.0)
MCV: 87 fL (ref 80.0–100.0)
Monocytes Absolute: 0.4 10*3/uL (ref 0.1–1.0)
Monocytes Relative: 8 %
Neutro Abs: 3.1 10*3/uL (ref 1.7–7.7)
Neutrophils Relative %: 64 %
Platelets: 304 10*3/uL (ref 150–400)
RBC: 4.55 MIL/uL (ref 3.87–5.11)
RDW: 12.8 % (ref 11.5–15.5)
WBC: 4.9 10*3/uL (ref 4.0–10.5)
nRBC: 0 % (ref 0.0–0.2)

## 2019-08-26 MED ORDER — CLONIDINE 0.1 MG/24HR TD PTWK: 0.1000 mg | MEDICATED_PATCH | TRANSDERMAL | 12 refills | Status: DC

## 2019-08-26 MED ORDER — TAMOXIFEN CITRATE 20 MG PO TABS: 20.0000 mg | ORAL_TABLET | Freq: Every day | ORAL | 4 refills | Status: DC

## 2019-08-26 NOTE — Telephone Encounter (Signed)
I talk with patient regarding schedule  

## 2019-09-25 ENCOUNTER — Telehealth: Payer: Self-pay | Admitting: Oncology

## 2019-09-25 NOTE — Telephone Encounter (Signed)
R/s appt per 3/17 sch msg - pt aware

## 2019-09-27 ENCOUNTER — Other Ambulatory Visit: Payer: Self-pay

## 2019-09-27 ENCOUNTER — Encounter: Payer: Self-pay | Admitting: Family Medicine

## 2019-09-27 ENCOUNTER — Ambulatory Visit: Payer: Medicare HMO | Admitting: Family Medicine

## 2019-09-27 DIAGNOSIS — M65312 Trigger thumb, left thumb: Secondary | ICD-10-CM | POA: Diagnosis not present

## 2019-09-27 MED ORDER — MELOXICAM 15 MG PO TABS: 15.0000 mg | ORAL_TABLET | Freq: Every day | ORAL | 0 refills | Status: DC

## 2019-09-27 NOTE — Assessment & Plan Note (Signed)
Discussed potential splinting at night.  Discussed home exercise, icing regimen, which activities to doing which wants to avoid.  Patient is to increase activity slowly over the course the next several weeks.  Patient knows that there is a chance that this will be a chronic problem with repetitive necessary injections.  Follow-up with me more on an as-needed basis at this time

## 2019-09-27 NOTE — Patient Instructions (Signed)
Happy you are doing so well Enjoy the week See me when you need me but dont hesitate to call

## 2019-09-27 NOTE — Progress Notes (Signed)
[place] Phone: [phone] Subjective:   Felicia Hampton, am serving as a scribe for Dr. Hulan Saas. This visit occurred during the SARS-CoV-2 public health emergency.  Safety protocols were in place, including screening questions prior to the visit, additional usage of staff PPE, and extensive cleaning of exam room while observing appropriate contact time as indicated for disinfecting solutions.  I'm seeing this patient by the request  of:  Bernerd Limbo, MD  CC: Left thumb follow-up  [id number]   08/16/2019 Injected today again.  We talked doing very well.  Discussed icing regimen and home exercise, which activities to do which wants to avoid.  Discussed further imaging if it continues to be the problem but hopefully patient will do well follow-up again in 4 to 8 weeks chronic problem with exacerbation.  Patient also has social determinants of health with recent loss of loved 1.  Update 09/27/2019 Felicia Hampton is a 64 y.o. female coming in with complaint of left hand pain. Patient states that the injection did help her thumb pain and triggering.  Patient has been doing really well overall.  Patient does have Hampton triggering at the moment.  Feels 100% better.     Past Medical History:  Diagnosis Date  . Adult ADHD   . Breast cancer ([place]) 2018   Left Breast Cancer  . Cancer [facility]) 2018   left breast  . Family history of breast cancer   . Family history of colon cancer   . Family history of ovarian cancer   . Genetic testing 08/02/2016   Negative; Hampton pathogenic mutations detected. STAT Breast panel with reflex to Common Cancers panel  Genes tested were the 43 genes on [facility]'s Common Cancers panel (APC, ATM, AXIN2, BARD1, BMPR1A, BRCA1, BRCA2, BRIP1, CDH1, CDKN2A, CHEK2, DICER1, EPCAM, GREM1, HOXB13, KIT, MEN1, MLH1, MSH2, MSH6, MUTYH, NBN, NF1, PALB2, PDGFRA, PMS2, POLD1, POLE, PTEN, RAD50, RAD51C, RAD51D, SDHA,  SDHB, SDHC, SDHD  . History of radiation therapy 10/10/16-11/24/16   left breast 50.4 Gy in 28 fractions, left breast boost 12 Gy in 6 fractions  . Hypercholesteremia   . Personal history of radiation therapy 2018   Left Breast Cancer  . PONV (postoperative nausea and vomiting)    Past Surgical History:  Procedure Laterality Date  . ABDOMINAL HYSTERECTOMY    . BREAST LUMPECTOMY Left 08/11/2016  . BREAST REDUCTION SURGERY Bilateral 08/23/2016   Procedure: BILATERAL ONCOPLASTIC BREAST REDUCTION;  Surgeon: Irene Limbo, MD;  Location: [place];  Service: Plastics;  Laterality: Bilateral;  . KNEE ARTHROSCOPY     x3  . RADIOACTIVE SEED GUIDED PARTIAL MASTECTOMY WITH AXILLARY SENTINEL LYMPH NODE BIOPSY Left 08/11/2016   Procedure: RADIOACTIVE SEED GUIDED LEFT BREAST LUMPECTOMY WITH AXILLARY SENTINEL LYMPH NODE BIOPSY;  Surgeon: Rolm Bookbinder, MD;  Location: [place];  Service: General;  Laterality: Left;  . REDUCTION MAMMAPLASTY    . SHOULDER ARTHROSCOPY W/ ROTATOR CUFF REPAIR Right    Social History   Socioeconomic History  . Marital status: Single    Spouse name: Not on file  . Number of children: 1  . Years of education: Not on file  . Highest education level: Not on file  Occupational History  . Occupation: Therapist, sports  Tobacco Use  . Smoking status: Former Smoker    Packs/day: 0.50    Years: 10.00    Pack years: 5.00    Quit date: 08/02/1988  Years since quitting: 31.1  . Smokeless tobacco: Never Used  Substance and Sexual Activity  . Alcohol use: Yes    Comment: social  . Drug use: Hampton  . Sexual activity: Not on file  Other Topics Concern  . Not on file  Social History Narrative  . Not on file   Social Determinants of Health   Financial Resource Strain:   . Difficulty of Paying Living Expenses:   Food Insecurity:   . Worried About Charity fundraiser in the Last Year:   . Arboriculturist in the Last Year:   Transportation Needs:   .  Film/video editor (Medical):   Marland Kitchen Lack of Transportation (Non-Medical):   Physical Activity:   . Days of Exercise per Week:   . Minutes of Exercise per Session:   Stress:   . Feeling of Stress :   Social Connections:   . Frequency of Communication with Friends and Family:   . Frequency of Social Gatherings with Friends and Family:   . Attends Religious Services:   . Active Member of Clubs or Organizations:   . Attends Archivist Meetings:   Marland Kitchen Marital Status:    Allergies  Allergen Reactions  . Ace Inhibitors Palpitations and Shortness Of Breath    COX-2 inhibitors  . Diclofenac     Other reaction(s): Hypotension (ALLERGY/intolerance)  . Morphine Nausea And Vomiting  . Morphine And Related Nausea And Vomiting  . Oxycodone Other (See Comments)    hallucinations   Family History  Problem Relation Age of Onset  . Colon cancer Sister 63       deceased 33  . Melanoma Brother        on back; currently 60  . Ovarian cancer Maternal Aunt   . Colon cancer Paternal Grandmother 50       deceased  . Breast cancer Cousin 20       mat female cousin; daughter of unaffected mat aunt; deceased 69  . Prostate cancer Father        deceased 49     Current Outpatient Medications (Cardiovascular):  .  atorvastatin (LIPITOR) 20 MG tablet, Take 20 mg by mouth daily at 6 PM. .  cloNIDine (CATAPRES-TTS-1) 0.1 mg/24hr patch, Place 1 patch (0.1 mg total) onto the skin once a week.   Current Outpatient Medications (Analgesics):  .  allopurinol (ZYLOPRIM) 100 MG tablet, Take 1 tablet (100 mg total) by mouth daily. .  meloxicam (MOBIC) 15 MG tablet, Take 1 tablet (15 mg total) by mouth daily.   Current Outpatient Medications (Other):  Marland Kitchen  Cholecalciferol (VITAMIN D) 2000 units tablet, Take by mouth. Marland Kitchen  Lifitegrast (XIIDRA) 5 % SOLN, Apply 1 drop to eye 2 (two) times daily. .  Multiple Vitamin (MULTIVITAMIN) capsule, Take by mouth. .  tamoxifen (NOLVADEX) 20 MG tablet, Take 1  tablet (20 mg total) by mouth daily. Marland Kitchen  venlafaxine XR (EFFEXOR-XR) 150 MG 24 hr capsule, Take 1 capsule (150 mg total) by mouth daily with breakfast. .  zolpidem (AMBIEN) 10 MG tablet, Take 10 mg by mouth at bedtime as needed for sleep.   Reviewed prior external information including notes and imaging from  primary care provider As well as notes that were available from care everywhere and other healthcare systems.  Past medical history, social, surgical and family history all reviewed in electronic medical record.  Hampton pertanent information unless stated regarding to the chief complaint.   Review of Systems:  Hampton  headache, visual changes, nausea, vomiting, diarrhea, constipation, dizziness, abdominal pain, skin rash, fevers, chills, night sweats, weight loss, swollen lymph nodes, body aches, joint swelling, chest pain, shortness of breath, mood changes. POSITIVE muscle aches  Objective  There were Hampton vitals taken for this visit.   General: Hampton apparent distress alert and oriented x3 mood and affect normal, dressed appropriately.  HEENT: Pupils equal, extraocular movements intact  Gait normal with good balance and coordination.  MSK: Arthritic changes of multiple joints left thumb exam note does not show any triggering, trigger nodule is noted there ago but is nontender.  Full grip strength.    Impression and Recommendations:     The above documentation has been reviewed and is accurate and complete Felicia Hampton       Note: This dictation was prepared with Dragon dictation along with smaller phrase technology. Any transcriptional errors that result from this process are unintentional.

## 2019-09-28 ENCOUNTER — Other Ambulatory Visit: Payer: Self-pay | Admitting: Family Medicine

## 2019-10-05 ENCOUNTER — Other Ambulatory Visit: Payer: Self-pay | Admitting: Family Medicine

## 2019-12-31 ENCOUNTER — Other Ambulatory Visit: Payer: Self-pay | Admitting: Family Medicine

## 2020-03-08 ENCOUNTER — Other Ambulatory Visit: Payer: Self-pay | Admitting: Oncology

## 2020-03-31 ENCOUNTER — Other Ambulatory Visit: Payer: Self-pay | Admitting: Family Medicine

## 2020-04-01 ENCOUNTER — Other Ambulatory Visit: Payer: Self-pay | Admitting: Family Medicine

## 2020-05-07 ENCOUNTER — Ambulatory Visit: Payer: Medicare HMO | Attending: Internal Medicine

## 2020-05-07 ENCOUNTER — Other Ambulatory Visit: Payer: Self-pay | Admitting: Oncology

## 2020-05-07 DIAGNOSIS — Z9889 Other specified postprocedural states: Secondary | ICD-10-CM

## 2020-05-07 DIAGNOSIS — Z23 Encounter for immunization: Secondary | ICD-10-CM

## 2020-05-07 NOTE — Progress Notes (Signed)
   Covid-19 Vaccination Clinic  Name:  Felicia Hampton    MRN: [id number] DOB: [birth year]  05/07/2020  Felicia Hampton was observed post Covid-19 immunization for 15 minutes without incident. She was provided with Vaccine Information Sheet and instruction to access the V-Safe system.   Felicia Hampton was instructed to call 911 with any severe reactions post vaccine: Marland Kitchen Difficulty breathing  . Swelling of face and throat  . A fast heartbeat  . A bad rash all over body  . Dizziness and weakness

## 2020-06-15 NOTE — Progress Notes (Unsigned)
[facility] [address] [place] Phone: [phone] Subjective:   I Felicia Hampton am serving as a Education administrator for Dr. Hulan Saas.  This visit occurred during the SARS-CoV-2 public health emergency.  Safety protocols were in place, including screening questions prior to the visit, additional usage of staff PPE, and extensive cleaning of exam room while observing appropriate contact time as indicated for disinfecting solutions.   I'm seeing this patient by the request  of:  Bernerd Limbo, MD  CC: Left hand pain  VQX:[id number]   09/27/2019 Discussed potential splinting at night.  Discussed home exercise, icing regimen, which activities to doing which wants to avoid.  Patient is to increase activity slowly over the course the next several weeks.  Patient knows that there is a chance that this will be a chronic problem with repetitive necessary injections.  Follow-up with me more on an as-needed basis at this time  Update 06/17/2020 Felicia Hampton is a 64 y.o. female coming in with complaint of trigger finger, left thumb. Patient states the hand is not doing well. Would like injections.  Patient has had 1 left thumb and seems to be more in the joint. Patient's last time though did have more of a trigger thumb. Has been 9 months since we have done the injections. Worsening symptoms over the last several weeks.     Past Medical History:  Diagnosis Date  . Adult ADHD   . Breast cancer ([place]) 2018   Left Breast Cancer  . Cancer [facility]) 2018   left breast  . Family history of breast cancer   . Family history of colon cancer   . Family history of ovarian cancer   . Genetic testing 08/02/2016   Negative; No pathogenic mutations detected. STAT Breast panel with reflex to Common Cancers panel  Genes tested were the 43 genes on [facility]'s Common Cancers panel (APC, ATM, AXIN2, BARD1, BMPR1A, BRCA1, BRCA2, BRIP1, CDH1, CDKN2A, CHEK2, DICER1, EPCAM, GREM1,  HOXB13, KIT, MEN1, MLH1, MSH2, MSH6, MUTYH, NBN, NF1, PALB2, PDGFRA, PMS2, POLD1, POLE, PTEN, RAD50, RAD51C, RAD51D, SDHA, SDHB, SDHC, SDHD  . History of radiation therapy 10/10/16-11/24/16   left breast 50.4 Gy in 28 fractions, left breast boost 12 Gy in 6 fractions  . Hypercholesteremia   . Personal history of radiation therapy 2018   Left Breast Cancer  . PONV (postoperative nausea and vomiting)    Past Surgical History:  Procedure Laterality Date  . ABDOMINAL HYSTERECTOMY    . BREAST LUMPECTOMY Left 08/11/2016  . BREAST REDUCTION SURGERY Bilateral 08/23/2016   Procedure: BILATERAL ONCOPLASTIC BREAST REDUCTION;  Surgeon: Irene Limbo, MD;  Location: [facility];  Service: Plastics;  Laterality: Bilateral;  . KNEE ARTHROSCOPY     x3  . RADIOACTIVE SEED GUIDED PARTIAL MASTECTOMY WITH AXILLARY SENTINEL LYMPH NODE BIOPSY Left 08/11/2016   Procedure: RADIOACTIVE SEED GUIDED LEFT BREAST LUMPECTOMY WITH AXILLARY SENTINEL LYMPH NODE BIOPSY;  Surgeon: Rolm Bookbinder, MD;  Location: [facility];  Service: General;  Laterality: Left;  . REDUCTION MAMMAPLASTY    . SHOULDER ARTHROSCOPY W/ ROTATOR CUFF REPAIR Right    Social History   Socioeconomic History  . Marital status: Single    Spouse name: Not on file  . Number of children: 1  . Years of education: Not on file  . Highest education level: Not on file  Occupational History  . Occupation: Therapist, sports  Tobacco Use  . Smoking status: Former Smoker  Packs/day: 0.50    Years: 10.00    Pack years: 5.00    Quit date: 08/02/1988    Years since quitting: 31.8  . Smokeless tobacco: Never Used  Substance and Sexual Activity  . Alcohol use: Yes    Comment: social  . Drug use: No  . Sexual activity: Not on file  Other Topics Concern  . Not on file  Social History Narrative  . Not on file   Social Determinants of Health   Financial Resource Strain:   . Difficulty of Paying Living Expenses: Not on file  Food  Insecurity:   . Worried About Charity fundraiser in the Last Year: Not on file  . Ran Out of Food in the Last Year: Not on file  Transportation Needs:   . Lack of Transportation (Medical): Not on file  . Lack of Transportation (Non-Medical): Not on file  Physical Activity:   . Days of Exercise per Week: Not on file  . Minutes of Exercise per Session: Not on file  Stress:   . Feeling of Stress : Not on file  Social Connections:   . Frequency of Communication with Friends and Family: Not on file  . Frequency of Social Gatherings with Friends and Family: Not on file  . Attends Religious Services: Not on file  . Active Member of Clubs or Organizations: Not on file  . Attends Archivist Meetings: Not on file  . Marital Status: Not on file   Allergies  Allergen Reactions  . Ace Inhibitors Palpitations and Shortness Of Breath    COX-2 inhibitors  . Diclofenac     Other reaction(s): Hypotension (ALLERGY/intolerance)  . Morphine Nausea And Vomiting  . Morphine And Related Nausea And Vomiting  . Oxycodone Other (See Comments)    hallucinations   Family History  Problem Relation Age of Onset  . Colon cancer Sister 92       deceased 51  . Melanoma Brother        on back; currently 72  . Ovarian cancer Maternal Aunt   . Colon cancer Paternal Grandmother 45       deceased  . Breast cancer Cousin 72       mat female cousin; daughter of unaffected mat aunt; deceased 54  . Prostate cancer Father        deceased 104     Current Outpatient Medications (Cardiovascular):  .  atorvastatin (LIPITOR) 20 MG tablet, Take 20 mg by mouth daily at 6 PM. .  cloNIDine (CATAPRES-TTS-1) 0.1 mg/24hr patch, Place 1 patch (0.1 mg total) onto the skin once a week.   Current Outpatient Medications (Analgesics):  .  allopurinol (ZYLOPRIM) 100 MG tablet, TAKE 1 TABLET BY MOUTH EVERY DAY .  meloxicam (MOBIC) 15 MG tablet, TAKE 1 TABLET BY MOUTH EVERY DAY   Current Outpatient Medications  (Other):  Marland Kitchen  Cholecalciferol (VITAMIN D) 2000 units tablet, Take by mouth. Marland Kitchen  Lifitegrast (XIIDRA) 5 % SOLN, Apply 1 drop to eye 2 (two) times daily. .  Multiple Vitamin (MULTIVITAMIN) capsule, Take by mouth. .  tamoxifen (NOLVADEX) 20 MG tablet, Take 1 tablet (20 mg total) by mouth daily. Marland Kitchen  venlafaxine XR (EFFEXOR-XR) 150 MG 24 hr capsule, TAKE 1 CAPSULE (150 MG TOTAL) BY MOUTH DAILY WITH BREAKFAST. Marland Kitchen  zolpidem (AMBIEN) 10 MG tablet, Take 10 mg by mouth at bedtime as needed for sleep.   Reviewed prior external information including notes and imaging from  primary care provider As  well as notes that were available from care everywhere and other healthcare systems.  Past medical history, social, surgical and family history all reviewed in electronic medical record.  No pertanent information unless stated regarding to the chief complaint.   Review of Systems:  No headache, visual changes, nausea, vomiting, diarrhea, constipation, dizziness, abdominal pain, skin rash, fevers, chills, night sweats, weight loss, swollen lymph nodes, body aches, joint swelling, chest pain, shortness of breath, mood changes. POSITIVE muscle aches  Objective  Blood pressure 130/84, pulse 84, height 5' 2"  (1.575 m), weight 176 lb (79.8 kg), SpO2 97 %.   General: No apparent distress alert and oriented x3 mood and affect normal, dressed appropriately.  HEENT: Pupils equal, extraocular movements intact  Respiratory: Patient's speak in full sentences and does not appear short of breath  Cardiovascular: No lower extremity edema, non tender, no erythema  Neuro: Cranial nerves II through XII are intact, neurovascularly intact in all extremities with 2+ DTRs and 2+ pulses.  Gait normal with good balance and coordination.  MSK: Left hand exam patient has some mild swelling noted over the [facility] joint. Positive grind test. Mild triggering of the thumb but more intermittent at the A2 pulley of the third finger. Patient does  have good grip strength still noted.  Procedure: Real-time Ultrasound Guided Injection of left CMC joint Device: GE Logiq Q7 Ultrasound guided injection is preferred based studies that show increased duration, increased effect, greater accuracy, decreased procedural pain, increased response rate, and decreased cost with ultrasound guided versus blind injection.  Verbal informed consent obtained.  Time-out conducted.  Noted no overlying erythema, induration, or other signs of local infection.  Skin prepped in a sterile fashion.  Local anesthesia: Topical Ethyl chloride.  With sterile technique and under real time ultrasound guidance: With a 25-gauge half inch needle injected into the [facility] joint from the superior approach with a total of 0.5 cc of 0.5% Marcaine and 0.5 cc of Kenalog 40 mg/mL Completed without difficulty  Pain immediately resolved suggesting accurate placement of the medication.  Advised to call if fevers/chills, erythema, induration, drainage, or persistent bleeding.  Impression: Technically successful ultrasound guided injection.  Procedure: Real-time Ultrasound Guided Injection of left third finger tendon flexor sheath Device: GE Logiq Q7 Ultrasound guided injection is preferred based studies that show increased duration, increased effect, greater accuracy, decreased procedural pain, increased response rate, and decreased cost with ultrasound guided versus blind injection.  Verbal informed consent obtained.  Time-out conducted.  Noted no overlying erythema, induration, or other signs of local infection.  Skin prepped in a sterile fashion.  Local anesthesia: Topical Ethyl chloride.  With sterile technique and under real time ultrasound guidance: With a 25-gauge half inch needle injected into the flexor tendon sheath with a total of 0.5 cc of 0.5% Marcaine and 0.5 cc of Kenalog 40 mg/mL Completed without difficulty  Pain immediately resolved suggesting accurate placement of  the medication.  Advised to call if fevers/chills, erythema, induration, drainage, or persistent bleeding.  Impression: Technically successful ultrasound guided injection.    Impression and Recommendations:     The above documentation has been reviewed and is accurate and complete Lyndal Pulley, DO

## 2020-06-17 ENCOUNTER — Ambulatory Visit: Payer: Self-pay

## 2020-06-17 ENCOUNTER — Encounter: Payer: Self-pay | Admitting: Family Medicine

## 2020-06-17 ENCOUNTER — Other Ambulatory Visit: Payer: Self-pay

## 2020-06-17 ENCOUNTER — Ambulatory Visit: Payer: Medicare HMO | Admitting: Family Medicine

## 2020-06-17 VITALS — BP 130/84 | HR 84 | Ht 62.0 in | Wt 176.0 lb

## 2020-06-17 DIAGNOSIS — M65332 Trigger finger, left middle finger: Secondary | ICD-10-CM

## 2020-06-17 DIAGNOSIS — M79642 Pain in left hand: Secondary | ICD-10-CM | POA: Diagnosis not present

## 2020-06-17 DIAGNOSIS — M1812 Unilateral primary osteoarthritis of first carpometacarpal joint, left hand: Secondary | ICD-10-CM | POA: Insufficient documentation

## 2020-06-17 NOTE — Assessment & Plan Note (Signed)
Patient admitted 1 year.  Discussed with patient about bracing at night.  Discussed home exercise and icing regimen.  Discussed avoiding certain activities.  Follow-up again 6 to 12 weeks meloxicam subjective pain

## 2020-06-17 NOTE — Assessment & Plan Note (Signed)
Patient given injection today.  Discussed icing regimen.  Does have meloxicam.  Patient does have usually a trigger thumb as well and we will monitor but no significant discomfort.  This is the first joint.  Would like to space these out to every 9 to 18 months if necessary.  Patient will do topical anti-inflammatories.  Will consider hand x-rays at follow-up.  Follow-up again 6 weeks

## 2020-06-17 NOTE — Patient Instructions (Addendum)
Good to see you Splint to sleep in for 2 weeks Rubber ball and rubberband exercises Happy Holidays! I am here when you need me

## 2020-06-23 ENCOUNTER — Ambulatory Visit
Admission: RE | Admit: 2020-06-23 | Discharge: 2020-06-23 | Disposition: A | Discharge status: Home / Self Care | Payer: Medicare HMO | Source: Ambulatory Visit | Attending: Oncology | Admitting: Oncology

## 2020-06-23 ENCOUNTER — Other Ambulatory Visit: Payer: Self-pay

## 2020-06-23 DIAGNOSIS — Z9889 Other specified postprocedural states: Secondary | ICD-10-CM

## 2020-07-24 DIAGNOSIS — C50412 Malignant neoplasm of upper-outer quadrant of left female breast: Secondary | ICD-10-CM | POA: Diagnosis not present

## 2020-08-25 ENCOUNTER — Ambulatory Visit: Payer: Medicare HMO | Admitting: Oncology

## 2020-08-25 ENCOUNTER — Other Ambulatory Visit: Payer: Medicare HMO

## 2020-08-27 DIAGNOSIS — C50912 Malignant neoplasm of unspecified site of left female breast: Secondary | ICD-10-CM | POA: Diagnosis not present

## 2020-08-27 DIAGNOSIS — Z9011 Acquired absence of right breast and nipple: Secondary | ICD-10-CM | POA: Diagnosis not present

## 2020-09-01 DIAGNOSIS — Z0100 Encounter for examination of eyes and vision without abnormal findings: Secondary | ICD-10-CM | POA: Diagnosis not present

## 2020-09-01 DIAGNOSIS — H5203 Hypermetropia, bilateral: Secondary | ICD-10-CM | POA: Diagnosis not present

## 2020-09-30 ENCOUNTER — Other Ambulatory Visit: Payer: Self-pay | Admitting: Family Medicine

## 2020-10-08 DIAGNOSIS — E559 Vitamin D deficiency, unspecified: Secondary | ICD-10-CM | POA: Diagnosis not present

## 2020-10-08 DIAGNOSIS — M810 Age-related osteoporosis without current pathological fracture: Secondary | ICD-10-CM | POA: Diagnosis not present

## 2020-10-08 DIAGNOSIS — Z0000 Encounter for general adult medical examination without abnormal findings: Secondary | ICD-10-CM | POA: Diagnosis not present

## 2020-10-08 DIAGNOSIS — Z1231 Encounter for screening mammogram for malignant neoplasm of breast: Secondary | ICD-10-CM | POA: Diagnosis not present

## 2020-10-08 DIAGNOSIS — Z23 Encounter for immunization: Secondary | ICD-10-CM | POA: Diagnosis not present

## 2020-10-08 DIAGNOSIS — R0683 Snoring: Secondary | ICD-10-CM | POA: Diagnosis not present

## 2020-10-08 DIAGNOSIS — E785 Hyperlipidemia, unspecified: Secondary | ICD-10-CM | POA: Diagnosis not present

## 2020-10-21 DIAGNOSIS — M8589 Other specified disorders of bone density and structure, multiple sites: Secondary | ICD-10-CM | POA: Diagnosis not present

## 2020-10-21 DIAGNOSIS — M85851 Other specified disorders of bone density and structure, right thigh: Secondary | ICD-10-CM | POA: Diagnosis not present

## 2020-10-21 DIAGNOSIS — M8588 Other specified disorders of bone density and structure, other site: Secondary | ICD-10-CM | POA: Diagnosis not present

## 2020-11-02 ENCOUNTER — Other Ambulatory Visit: Payer: Self-pay | Admitting: Oncology

## 2020-11-05 DIAGNOSIS — L570 Actinic keratosis: Secondary | ICD-10-CM | POA: Diagnosis not present

## 2020-11-05 DIAGNOSIS — L814 Other melanin hyperpigmentation: Secondary | ICD-10-CM | POA: Diagnosis not present

## 2020-11-05 DIAGNOSIS — Z85828 Personal history of other malignant neoplasm of skin: Secondary | ICD-10-CM | POA: Diagnosis not present

## 2020-12-12 DIAGNOSIS — G4700 Insomnia, unspecified: Secondary | ICD-10-CM | POA: Diagnosis not present

## 2020-12-12 DIAGNOSIS — G8929 Other chronic pain: Secondary | ICD-10-CM | POA: Diagnosis not present

## 2020-12-12 DIAGNOSIS — Z809 Family history of malignant neoplasm, unspecified: Secondary | ICD-10-CM | POA: Diagnosis not present

## 2020-12-12 DIAGNOSIS — Z8249 Family history of ischemic heart disease and other diseases of the circulatory system: Secondary | ICD-10-CM | POA: Diagnosis not present

## 2020-12-12 DIAGNOSIS — Z79810 Long term (current) use of selective estrogen receptor modulators (SERMs): Secondary | ICD-10-CM | POA: Diagnosis not present

## 2020-12-12 DIAGNOSIS — M1990 Unspecified osteoarthritis, unspecified site: Secondary | ICD-10-CM | POA: Diagnosis not present

## 2020-12-12 DIAGNOSIS — C50919 Malignant neoplasm of unspecified site of unspecified female breast: Secondary | ICD-10-CM | POA: Diagnosis not present

## 2020-12-12 DIAGNOSIS — Z85828 Personal history of other malignant neoplasm of skin: Secondary | ICD-10-CM | POA: Diagnosis not present

## 2020-12-12 DIAGNOSIS — E785 Hyperlipidemia, unspecified: Secondary | ICD-10-CM | POA: Diagnosis not present

## 2020-12-12 DIAGNOSIS — E669 Obesity, unspecified: Secondary | ICD-10-CM | POA: Diagnosis not present

## 2021-01-06 ENCOUNTER — Encounter: Payer: Self-pay | Admitting: Neurology

## 2021-01-07 ENCOUNTER — Ambulatory Visit: Payer: Medicare HMO | Admitting: Neurology

## 2021-01-07 ENCOUNTER — Encounter: Payer: Self-pay | Admitting: Neurology

## 2021-01-07 VITALS — BP 123/78 | HR 101 | Ht 62.0 in | Wt 181.0 lb

## 2021-01-07 DIAGNOSIS — R0683 Snoring: Secondary | ICD-10-CM

## 2021-01-07 DIAGNOSIS — G4733 Obstructive sleep apnea (adult) (pediatric): Secondary | ICD-10-CM

## 2021-01-07 DIAGNOSIS — G4719 Other hypersomnia: Secondary | ICD-10-CM | POA: Diagnosis not present

## 2021-01-07 DIAGNOSIS — G4734 Idiopathic sleep related nonobstructive alveolar hypoventilation: Secondary | ICD-10-CM

## 2021-01-07 NOTE — Progress Notes (Signed)
SLEEP MEDICINE CLINIC    Provider:  Larey Seat, MD  Primary Care Physician:  Bernerd Limbo, MD [place] [facility] [place] [id number]     Referring Provider: Bernerd Limbo, [place],  [place] [address]          Chief Complaint according to patient   Patient presents with:     New Patient (Initial Visit)           HISTORY OF PRESENT ILLNESS:  Felicia Hampton is a 65  year old Caucasian female patient and is seen here upon referral on 01/07/2021 from PCP. Her late husband had often mentioned her snoring. Recently she went to a nursing conference and shared a room with colleagues, and she was made aware of her apnea.  She has some sleepiness in daytime, when inactive.    I have the pleasure of seeing Felicia Hampton today, a left-handed Caucasian female with a possible sleep disorder.  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 Her husband died in 08/18/19.    She has a  has a past medical history of Adult ADHD, Breast cancer ([place]) 08-17-2016), Family history of breast cancer,  Family history of colon cancer,   Genetic testing (08/02/2016), History of radiation therapy (10/10/16-11/24/16), Hypercholesteremia, Personal history of radiation therapy 08-17-2016), and PONV (postoperative nausea and vomiting).      Sleep relevant medical history: Family medical /sleep history: no  other family member diagnosed with OSA.  Social history:  Patient is working as an Haematologist- returned to part time.  She lives in a household alone. No pets,  1 biological child.  The patient currently works daytime -Tobacco use: quit 32 years ago, was a 1 pack per week.   ETOH use: socially, Caffeine intake in form of Coffee( 1 cup of coffee ) Soda( /) Tea ( /) or energy drinks. Regular exercise in form of walking.  Hobbies : reading- rarely cooking now, bible study.       Sleep habits are as follows: The patient's dinner time is between 6-7 PM. The patient goes  to bed at 10-10.30 PM and continues to sleep for 6 hours, wakes for one bathroom breaks, the first time at 5 AM.   The preferred sleep position is side and prone , with the support of 2 pillows. Sleep number bed- adjustable. Dreams are reportedly rare- and often nightmares.  She often wakes up at an undesired time.  6  AM is the usual rise time. The patient wakes up spontaneously. He/ She reports not feeling refreshed or restored in AM, with symptoms such as dry mouth, morning residual fatigue. Naps are taken frequently, not in bed, lasting from 15 to 30 minutes and are more refreshing than nocturnal sleep.    Review of Systems: Out of a complete 14 system review, the patient complains of only the following symptoms, and all other reviewed systems are negative.:  Fatigue, sleepiness , snoring, fragmented sleep, Insomnia - early arousal.  Tamoxifen , effexor for hot flushes.  Breast cancer survivor.    How likely are you to doze in the following situations: 0 = not likely, 1 = slight chance, 2 = moderate chance, 3 = high chance   Sitting and Reading? Watching Television? Sitting inactive in a public place (theater or meeting)? As a passenger in a car for an hour without a break? Lying down in the afternoon when circumstances  permit? Sitting and talking to someone? Sitting quietly after lunch without alcohol? In a car, while stopped for a few minutes in traffic?   Total = 14/ 24 points  with one or two naps a day,    FSS endorsed at 27/ 63 points.  GDS:  8 / 15   Social History   Socioeconomic History   Marital status: Single    Spouse name: Not on file   Number of children: 1   Years of education: Not on file   Highest education level: Not on file  Occupational History   Occupation: RN  Tobacco Use   Smoking status: Former    Packs/day: 0.50    Years: 10.00    Pack years: 5.00    Types: Cigarettes    Quit date: 08/02/1988    Years since quitting: 32.4   Smokeless tobacco:  Never  Substance and Sexual Activity   Alcohol use: Yes    Comment: social   Drug use: No   Sexual activity: Not on file  Other Topics Concern   Not on file  Social History Narrative   Not on file   Social Determinants of Health   Financial Resource Strain: Not on file  Food Insecurity: Not on file  Transportation Needs: Not on file  Physical Activity: Not on file  Stress: Not on file  Social Connections: Not on file    Family History  Problem Relation Age of Onset   Colon cancer Sister 48       deceased 34   Melanoma Brother        on back; currently 48   Ovarian cancer Maternal Aunt    Colon cancer Paternal Grandmother 96       deceased   Breast cancer Cousin 72       mat female cousin; daughter of unaffected mat aunt; deceased 88   Prostate cancer Father        deceased 64    Past Medical History:  Diagnosis Date   Adult ADHD    Breast cancer ([place]) 2018   Left Breast Cancer   Cancer ([place]) 2018   left breast   Family history of breast cancer    Family history of colon cancer    Family history of ovarian cancer    Genetic testing 08/02/2016   Negative; No pathogenic mutations detected. STAT Breast panel with reflex to Common Cancers panel  Genes tested were the 43 genes on [facility]'s Common Cancers panel (APC, ATM, AXIN2, BARD1, BMPR1A, BRCA1, BRCA2, BRIP1, CDH1, CDKN2A, CHEK2, DICER1, EPCAM, GREM1, HOXB13, KIT, MEN1, MLH1, MSH2, MSH6, MUTYH, NBN, NF1, PALB2, PDGFRA, PMS2, POLD1, POLE, PTEN, RAD50, RAD51C, RAD51D, SDHA, SDHB, SDHC, SDHD   History of radiation therapy 10/10/16-11/24/16   left breast 50.4 Gy in 28 fractions, left breast boost 12 Gy in 6 fractions   Hypercholesteremia    Personal history of radiation therapy 2018   Left Breast Cancer   PONV (postoperative nausea and vomiting)     Past Surgical History:  Procedure Laterality Date   ABDOMINAL HYSTERECTOMY     BREAST LUMPECTOMY Left 08/11/2016   BREAST REDUCTION SURGERY Bilateral 08/23/2016    Procedure: BILATERAL ONCOPLASTIC BREAST REDUCTION;  Surgeon: Irene Limbo, MD;  Location: [facility];  Service: Plastics;  Laterality: Bilateral;   KNEE ARTHROSCOPY     x3   RADIOACTIVE SEED GUIDED PARTIAL MASTECTOMY WITH AXILLARY SENTINEL LYMPH NODE BIOPSY Left 08/11/2016   Procedure: RADIOACTIVE SEED GUIDED LEFT BREAST LUMPECTOMY WITH  AXILLARY SENTINEL LYMPH NODE BIOPSY;  Surgeon: Rolm Bookbinder, MD;  Location: [facility];  Service: General;  Laterality: Left;   REDUCTION MAMMAPLASTY     SHOULDER ARTHROSCOPY W/ ROTATOR CUFF REPAIR Right      Current Outpatient Medications on File Prior to Visit  Medication Sig Dispense Refill   allopurinol (ZYLOPRIM) 100 MG tablet TAKE 1 TABLET BY MOUTH EVERY DAY 90 tablet 1   atorvastatin (LIPITOR) 20 MG tablet Take 20 mg by mouth daily at 6 PM.     Cholecalciferol (VITAMIN D) 2000 units tablet Take by mouth.     meloxicam (MOBIC) 15 MG tablet TAKE 1 TABLET BY MOUTH EVERY DAY (Patient taking differently: daily as needed.) 90 tablet 0   Multiple Vitamin (MULTIVITAMIN) capsule Take by mouth.     tamoxifen (NOLVADEX) 20 MG tablet TAKE 1 TABLET BY MOUTH EVERY DAY 90 tablet 4   tamoxifen (NOLVADEX) 20 MG tablet Take by mouth.     venlafaxine XR (EFFEXOR-XR) 150 MG 24 hr capsule TAKE 1 CAPSULE (150 MG TOTAL) BY MOUTH DAILY WITH BREAKFAST. 90 capsule 4   Vitamin D, Ergocalciferol, (DRISDOL) 1.25 MG (50000 UNIT) CAPS capsule Take 50,000 Units by mouth once a week.     zolpidem (AMBIEN) 10 MG tablet Take 10 mg by mouth at bedtime as needed for sleep.     No current facility-administered medications on file prior to visit.    Allergies  Allergen Reactions   Ace Inhibitors Palpitations and Shortness Of Breath    COX-2 inhibitors   Diclofenac     Other reaction(s): Hypotension (ALLERGY/intolerance)   Morphine Nausea And Vomiting   Morphine And Related Nausea And Vomiting   Oxycodone Other (See Comments)    hallucinations     Physical exam:  Today's Vitals   01/07/21 1306  BP: 123/78  Pulse: (!) 101  Weight: 181 lb (82.1 kg)  Height: _0  (1.575 m)   Body mass index is 33.11 kg/m.   Wt Readings from Last 3 Encounters:  01/07/21 181 lb (82.1 kg)  06/17/20 176 lb (79.8 kg)  09/27/19 177 lb (80.3 kg)     Ht Readings from Last 3 Encounters:  01/07/21 _1  (1.575 m)  06/17/20 _2  (1.575 m)  09/27/19 _3  (1.575 m)      General: The patient is awake, alert and appears not in acute distress. The patient is well groomed. Head: Normocephalic, atraumatic. Neck is supple.  Mallampati 1,  neck circumference:16 inches . Nasal airflow  patent.  the patient is diaphoretic.  Trunk: The patient's posture is erect.   Neurologic exam : The patient is awake and alert, oriented to place and time.   Memory subjective described as intact.  Attention span & concentration ability appears normal.  Speech is fluent,  without  dysarthria, dysphonia or aphasia.  Mood and affect are appropriate.   Cranial nerves: no loss of smell or taste reported  Pupils are equal and briskly reactive to light. Funduscopic exam deferred. .  Extraocular movements in vertical and horizontal planes were intact and without nystagmus. No Diplopia. Visual fields by finger perimetry are intact. Hearing was intact to soft voice and finger rubbing.    Facial sensation intact to fine touch.  Facial motor strength is symmetric and tongue and uvula move midline.  Neck ROM : rotation, tilt and flexion extension were normal for age and shoulder shrug was symmetrical.    Motor exam:  Symmetric bulk, tone and ROM.  Normal tone without cog -wheeling, right sided reduced grip strength . Sensory:  Fine touch, pinprick and vibration were  normal.  Proprioception tested in the upper extremities was normal. Coordination: Rapid alternating movements in the fingers/hands were of normal speed. Arthritis at the thumb joint.  The Finger-to-nose  maneuver was intact without evidence of ataxia, dysmetria or tremor. Gait and station: Patient could rise unassisted from a seated position, walked without assistive device.  Stance is of normal width/ base and the patient turned with 3 steps.  Toe and heel walk were deferred.  Deep tendon reflexes: in the  upper and lower extremities are symmetric and intact.  Babinski response was deferred /.     After spending a total time of 45 minutes face to face and additional time for physical and neurologic examination, review of laboratory studies,  personal review of imaging studies, reports and results of other testing and review of referral information / records as far as provided in visit, I have established the following assessments:  I have the pleasure of meeting today nurse [place] who has been diagnosed with breast cancer in the year 2020 and has undergone surgery as well as radiation and is now on antihormonal therapy with tamoxifen.  This has given her some hot flashes kind of aggressive menopausal symptoms and these also affect the way she sleeps.  She lost her husband in 2021 who struggled with Alzheimer's dementia, she has begun to work again at least part-time as an Haematologist and she is socially well connected as her son and 3 young grandchildren are living close by.  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  She has not smoked in over 30 years she is a very moderate alcohol drinker her BMI is certainly too high at this point it is just below 35 her exercise routine consist of walking mostly she she does report an excessive daytime sleepiness and early morning arousals that are not desired at the time they occur.  I think that is the witnessed apnea as reported through a colleague and friend of hers was definitely a point that makes screening for obstructive sleep apnea necessary.  I would be happy to treat any kind of insomnia and sleep fragmentation at a later point as well but  first I want to make sure what the organic sleep disorder contributed to her current daytime sleepiness.  She has been taking as needed Ambien 10 mg tablets, she is on Effexor she has been taking tamoxifen and she is on allopurinol and Mobic.   I usually prefer trazodone as a sleep inducing medication that is not addictive over Ambien but this can interfere with his Effexor or other serotonin reuptake inhibitors so we will not change anything here today.  Headaches are not a primary feature of her sleep disorder.  I am looking forward to invite her to a home sleep test if we can get a split-night study approved by her insurance that may be the quickest way to have both the diagnosis and the initiation of therapy so I will order both studies and see what the insurance will be allowing.     My Plan is to proceed with:  1)SPLIT and HST ordered.    I would like to thank Bernerd Limbo, MD and Bernerd Limbo, [address] [place],  [place] [address] for allowing me to meet with and to take care of this pleasant patient.   In short, Felicia  H Hampton is presenting with EDS and diaphoresis , loud snoring, obesity. , a symptom that can be attributed to OSA.  I plan to follow up either personally or through our NP within 2-4 month.   CC: I will share my notes with PCP, Dr Coletta Memos.  Electronically signed by: Larey Seat, MD 01/07/2021 1:15 PM  [facility] Sleep Board certified by The [employer] of Sleep Medicine and Diplomate of the [employer] of Sleep Medicine. Board certified In Neurology through the [employer], Fellow of the [employer] of Neurology. Medical Director of [employer].

## 2021-01-07 NOTE — Patient Instructions (Signed)
Sleep Apnea Sleep apnea affects breathing during sleep. It causes breathing to stop for 10 seconds or more, or to become shallow. People with sleep apnea usually snoreloudly. It can also increase the risk of: Heart attack. Stroke. Being very overweight (obese). Diabetes. Heart failure. Irregular heartbeat. High blood pressure. The goal of treatment is to help you breathe normally again. What are the causes?  The most common cause of this condition is a collapsed or blocked airway. There are three kinds of sleep apnea: Obstructive sleep apnea. This is caused by a blocked or collapsed airway. Central sleep apnea. This happens when the brain does not send the right signals to the muscles that control breathing. Mixed sleep apnea. This is a combination of obstructive and central sleep apnea. What increases the risk? Being overweight. Smoking. Having a small airway. Being older. Being female. Drinking alcohol. Taking medicines to calm yourself (sedatives or tranquilizers). Having family members with the condition. Having a tongue or tonsils that are larger than normal. What are the signs or symptoms? Trouble staying asleep. Loud snoring. Headaches in the morning. Waking up gasping. Dry mouth or sore throat in the morning. Being sleepy or tired during the day. If you are sleepy or tired during the day, you may also: Not be able to focus your mind (concentrate). Forget things. Get angry a lot and have mood swings. Feel sad (depressed). Have changes in your personality. Have less interest in sex, if you are female. Be unable to have an erection, if you are female. How is this treated?  Sleeping on your side. Using a medicine to get rid of mucus in your nose (decongestant). Avoiding the use of alcohol, medicines to help you relax, or certain pain medicines (narcotics). Losing weight, if needed. Changing your diet. Quitting smoking. Using a machine to open your airway while you  sleep, such as: An oral appliance. This is a mouthpiece that shifts your lower jaw forward. A CPAP device. This device blows air through a mask when you breathe out (exhale). An EPAP device. This has valves that you put in each nostril. A BPAP device. This device blows air through a mask when you breathe in (inhale) and breathe out. Having surgery if other treatments do not work. Follow these instructions at home: Lifestyle Make changes that your doctor recommends. Eat a healthy diet. Lose weight if needed. Avoid alcohol, medicines to help you relax, and some pain medicines. Do not smoke or use any products that contain nicotine or tobacco. If you need help quitting, ask your doctor. General instructions Take over-the-counter and prescription medicines only as told by your doctor. If you were given a machine to use while you sleep, use it only as told by your doctor. If you are having surgery, make sure to tell your doctor you have sleep apnea. You may need to bring your device with you. Keep all follow-up visits. Contact a doctor if: The machine that you were given to use during sleep bothers you or does not seem to be working. You do not get better. You get worse. Get help right away if: Your chest hurts. You have trouble breathing in enough air. You have an uncomfortable feeling in your back, arms, or stomach. You have trouble talking. One side of your body feels weak. A part of your face is hanging down. These symptoms may be an emergency. Get help right away. Call your local emergency services (911 in the [place] Do not wait to see if the symptoms   will go away. Do not drive yourself to the hospital. Summary This condition affects breathing during sleep. The most common cause is a collapsed or blocked airway. The goal of treatment is to help you breathe normally while you sleep. This information is not intended to replace advice given to you by your health care provider. Make  sure you discuss any questions you have with your healthcare provider. Document Revised: 06/05/2020 Document Reviewed: 06/05/2020 Elsevier Patient Education  2022 [employer] Inc[employer]  

## 2021-02-17 ENCOUNTER — Telehealth: Payer: Self-pay

## 2021-02-17 NOTE — Telephone Encounter (Signed)
LVM for pt to call the sleep lab back with updated or new insurance card information

## 2021-03-16 ENCOUNTER — Ambulatory Visit (INDEPENDENT_AMBULATORY_CARE_PROVIDER_SITE_OTHER): Payer: Medicare HMO | Admitting: Neurology

## 2021-03-16 DIAGNOSIS — G4733 Obstructive sleep apnea (adult) (pediatric): Secondary | ICD-10-CM

## 2021-03-16 DIAGNOSIS — R0683 Snoring: Secondary | ICD-10-CM

## 2021-03-24 DIAGNOSIS — G4733 Obstructive sleep apnea (adult) (pediatric): Secondary | ICD-10-CM | POA: Insufficient documentation

## 2021-03-24 DIAGNOSIS — L821 Other seborrheic keratosis: Secondary | ICD-10-CM | POA: Diagnosis not present

## 2021-03-24 DIAGNOSIS — L237 Allergic contact dermatitis due to plants, except food: Secondary | ICD-10-CM | POA: Diagnosis not present

## 2021-03-24 DIAGNOSIS — R0683 Snoring: Secondary | ICD-10-CM | POA: Insufficient documentation

## 2021-03-24 DIAGNOSIS — L570 Actinic keratosis: Secondary | ICD-10-CM | POA: Diagnosis not present

## 2021-03-24 NOTE — Progress Notes (Signed)
[facility]  [phone] [phone]     ID: Felicia Hampton DOB: 07/15/1955  MR#: [id number]  CSN#:[id number]  Patient Care Team: Bouska, David, MD as PCP - General (Family Medicine) Magrinat, Gustav C, MD as Consulting Physician (Oncology) Wakefield, Matthew, MD as Consulting Physician (General Surgery) Neal, W Ronald, MD as Consulting Physician (Obstetrics and Gynecology) Thimmappa, Brinda, MD as Consulting Physician (Plastic Surgery) Causey, Lindsey Cornetto, NP as Nurse Practitioner (Hematology and Oncology) Smith, Zachary M, DO as Consulting Physician (Family Medicine) OTHER MD:  CHIEF COMPLAINT: Estrogen receptor positive breast cancer  CURRENT TREATMENT: Tamoxifen   INTERVAL HISTORY:  Felicia Hampton returns today for follow-up of her estrogen receptor positive breast cancer.  She is tolerating tamoxifen moderately well, with continuing hot flashes but no significant vaginal discharge or other side effects.  Since her last visit, she underwent bilateral diagnostic mammography with tomography at [facility] on 06/23/2020 showing: breast density category B; no evidence of malignancy in either breast.   REVIEW OF SYSTEMS: Felicia Hampton is not swimming for exercise but she does go to the gym and does quite a bit of gardening.  Recall her husband died January 2021, with problems related to dementia.  She is still grieving appropriately from this.  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  She got into some poison sumac in her yard and had a Medrol Dosepak.  A detailed review of systems today was otherwise stable   COVID 19 VACCINATION STATUS: Pfizer x3 as of September 2022   BREAST CANCER HISTORY: From the original consult note:  Tashya had routine screening mammography at Dr. Neal's suggesting an abnormality in her left breast upper  inner quadrant. Breast density was category B. She was referred for ultrasonography which did not identify the mass. It also found the axilla to be clear sonographically. Breast MRI 07/01/2016 found no abnormalities in the right breast. In the left breast central upper section there was a 0.8 cm enhancing mass associated with architectural distortion. There was a 6 cm area of non-masslike enhancement in the upper outer quadrant of the left breast 3 cm lateral to the other lesion. There were no abnormal appearing lymph nodes.  On 07/12/2016 she underwent MRI guided core biopsies of these 2 areas in question. The final pathology (SAA [id number]) showed in the upper inner quadrant, invasive ductal carcinoma, grade 1, estrogen receptor 100% positive, progesterone receptor 100% positive, both with strong staining intensity, with an MIB-1 of 2%, and no HER-2 amplification, the signals ratio being 1.89 and the number per cell 2.65. In the upper outer quadrant biopsy there was only atypical lobular hyperplasia.  The patient's subsequent history is as detailed below   PAST MEDICAL HISTORY: Past Medical History:  Diagnosis Date   Adult ADHD    Breast cancer (HCC) 2018   Left Breast Cancer   Cancer (HCC) 2018   left breast   Family history of breast cancer    Family history of colon cancer    Family history of ovarian cancer    Genetic testing 08/02/2016   Negative; No pathogenic mutations detected. STAT Breast panel with reflex to Common Cancers panel  Genes tested were the 43 genes on [facility]'s Common Cancers panel (APC, ATM, AXIN2, BARD1, BMPR1A, BRCA1, BRCA2, BRIP1, CDH1, CDKN2A, CHEK2, DICER1, EPCAM, GREM1, HOXB13, KIT, MEN1, MLH1, MSH2, MSH6, MUTYH,   NBN, NF1, PALB2, PDGFRA, PMS2, POLD1, POLE, PTEN, RAD50, RAD51C, RAD51D, SDHA, SDHB, SDHC, SDHD   History of radiation therapy 10/10/16-11/24/16   left breast 50.4 Gy in 28 fractions, left breast boost 12 Gy in 6 fractions   Hypercholesteremia    Personal history  of radiation therapy 2016/08/15   Left Breast Cancer   PONV (postoperative nausea and vomiting)     PAST SURGICAL HISTORY: Past Surgical History:  Procedure Laterality Date   ABDOMINAL HYSTERECTOMY     BREAST LUMPECTOMY Left 08/11/2016   BREAST REDUCTION SURGERY Bilateral 08/23/2016   Procedure: BILATERAL ONCOPLASTIC BREAST REDUCTION;  Surgeon: Irene Limbo, MD;  Location: [facility];  Service: Plastics;  Laterality: Bilateral;   KNEE ARTHROSCOPY     x3   RADIOACTIVE SEED GUIDED PARTIAL MASTECTOMY WITH AXILLARY SENTINEL LYMPH NODE BIOPSY Left 08/11/2016   Procedure: RADIOACTIVE SEED GUIDED LEFT BREAST LUMPECTOMY WITH AXILLARY SENTINEL LYMPH NODE BIOPSY;  Surgeon: Rolm Bookbinder, MD;  Location: [facility];  Service: General;  Laterality: Left;   REDUCTION MAMMAPLASTY     SHOULDER ARTHROSCOPY W/ ROTATOR CUFF REPAIR Right     FAMILY HISTORY Family History  Problem Relation Age of Onset   Colon cancer Sister 62       deceased 81   Melanoma Brother        on back; currently 55   Ovarian cancer Maternal Aunt    Colon cancer Paternal Grandmother 96       deceased   Breast cancer Cousin 1       mat female cousin; daughter of unaffected mat aunt; deceased 37   Prostate cancer Father        deceased 37  The patient's father died from prostate cancer at age 72. The patient's mother died from heart disease at age 61. The patient had 5 brothers, 6 sisters. A twin sister died at the age of 12 from colon cancer. A maternal cousin had breast cancer at age 65. A maternal aunt had ovarian cancer, age at diagnosis unknown. Another maternal aunt had kidney cancer. On the father's side A grandmother died at age 46 with colon cancer   GYNECOLOGIC HISTORY:  No LMP recorded. Patient is postmenopausal. Menarche age 73, first live birth age 56. The patient is GX P1. She Underwent hysterectomy in her late 1s and was on hormone replacement for approximately 10 years, stopping  approximately 08/15/2014   SOCIAL HISTORY:  Felicia Hampton has an interesting athletic history and she swam the [place] channel to [place] and also did other long-term swims in her youth. She is originally from [place]. 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 Her husband Herbie Baltimore, who passed away in early August 16, 2019, was retired from Insurance underwriter. The patient's son Rolla Plate lives in [place] and is in Transport planner. The patient has 1 biological granddaughter, 4 months old as of 2016/08/15.    ADVANCED DIRECTIVES: Not in place   HEALTH MAINTENANCE: Social History   Tobacco Use   Smoking status: Former    Packs/day: 0.50    Years: 10.00    Pack years: 5.00    Types: Cigarettes    Quit date: 08/02/1988    Years since quitting: 32.6   Smokeless tobacco: Never  Substance Use Topics   Alcohol use: Yes    Comment: social   Drug use: No     Colonoscopy: July 2016  PAP: 08-15-14  Bone density: 2015   Allergies  Allergen Reactions  Ace Inhibitors Palpitations and Shortness Of Breath    COX-2 inhibitors   Diclofenac     Other reaction(s): Hypotension (ALLERGY/intolerance)   Morphine Nausea And Vomiting   Morphine And Related Nausea And Vomiting   Oxycodone Other (See Comments)    hallucinations    Current Outpatient Medications  Medication Sig Dispense Refill   allopurinol (ZYLOPRIM) 100 MG tablet TAKE 1 TABLET BY MOUTH EVERY DAY 90 tablet 1   atorvastatin (LIPITOR) 20 MG tablet Take 20 mg by mouth daily at 6 PM.     Cholecalciferol (VITAMIN D) 2000 units tablet Take by mouth.     meloxicam (MOBIC) 15 MG tablet TAKE 1 TABLET BY MOUTH EVERY DAY (Patient taking differently: daily as needed.) 90 tablet 0   Multiple Vitamin (MULTIVITAMIN) capsule Take by mouth.     tamoxifen (NOLVADEX) 20 MG tablet TAKE 1 TABLET BY MOUTH EVERY DAY 90 tablet 4   tamoxifen (NOLVADEX) 20 MG tablet Take by mouth.     venlafaxine XR (EFFEXOR-XR) 150 MG 24 hr capsule TAKE 1 CAPSULE (150  MG TOTAL) BY MOUTH DAILY WITH BREAKFAST. 90 capsule 4   Vitamin D, Ergocalciferol, (DRISDOL) 1.25 MG (50000 UNIT) CAPS capsule Take 50,000 Units by mouth once a week.     zolpidem (AMBIEN) 10 MG tablet Take 10 mg by mouth at bedtime as needed for sleep.     No current facility-administered medications for this visit.    OBJECTIVE: Middle-aged white woman in no acute distress  Vitals:   03/25/21 1118  BP: 137/75  Pulse: 83  Resp: 18  Temp: 97.7 F (36.5 C)  SpO2: 98%      Body mass index is 32.96 kg/m.    ECOG FS:1 - Symptomatic but completely ambulatory  Sclerae unicteric, EOMs intact Wearing a mask No cervical or supraclavicular adenopathy Lungs no rales or rhonchi Heart regular rate and rhythm Abd soft, nontender, positive bowel sounds MSK no focal spinal tenderness, no upper extremity lymphedema Neuro: nonfocal, well oriented, appropriate affect Breasts: The right breast is status post reduction mammoplasty.  The left breast is status postlumpectomy and radiation as well as reduction mammoplasty.  There is no evidence of recurrent disease.  Both axillae are benign   LAB RESULTS:  CMP     Component Value Date/Time   NA 139 03/25/2021 1109   NA 139 06/07/2017 1344   K 4.2 03/25/2021 1109   K 3.7 06/07/2017 1344   CL 106 03/25/2021 1109   CO2 23 03/25/2021 1109   CO2 26 06/07/2017 1344   GLUCOSE 143 (H) 03/25/2021 1109   GLUCOSE 79 06/07/2017 1344   BUN 11 03/25/2021 1109   BUN 13.2 06/07/2017 1344   CREATININE 0.73 03/25/2021 1109   CREATININE 0.71 02/26/2019 1111   CREATININE 0.7 06/07/2017 1344   CALCIUM 9.6 03/25/2021 1109   CALCIUM 9.1 06/07/2017 1344   PROT 7.3 03/25/2021 1109   PROT 6.7 06/07/2017 1344   ALBUMIN 3.9 03/25/2021 1109   ALBUMIN 3.7 06/07/2017 1344   AST 21 03/25/2021 1109   AST 32 02/26/2019 1111   AST 21 06/07/2017 1344   ALT 22 03/25/2021 1109   ALT 34 02/26/2019 1111   ALT 20 06/07/2017 1344   ALKPHOS 74 03/25/2021 1109   ALKPHOS  64 06/07/2017 1344   BILITOT <0.2 (L) 03/25/2021 1109   BILITOT 0.3 02/26/2019 1111   BILITOT 0.29 06/07/2017 1344   GFRNONAA >60 03/25/2021 1109   GFRNONAA >60 02/26/2019 1111   GFRAA >60 08/26/2019   1105   GFRAA >60 02/26/2019 1111    INo results found for: SPEP, UPEP  Lab Results  Component Value Date   WBC 9.0 03/25/2021   NEUTROABS 8.0 (H) 03/25/2021   HGB 13.3 03/25/2021   HCT 39.2 03/25/2021   MCV 84.3 03/25/2021   PLT 321 03/25/2021      Chemistry      Component Value Date/Time   NA 139 03/25/2021 1109   NA 139 06/07/2017 1344   K 4.2 03/25/2021 1109   K 3.7 06/07/2017 1344   CL 106 03/25/2021 1109   CO2 23 03/25/2021 1109   CO2 26 06/07/2017 1344   BUN 11 03/25/2021 1109   BUN 13.2 06/07/2017 1344   CREATININE 0.73 03/25/2021 1109   CREATININE 0.71 02/26/2019 1111   CREATININE 0.7 06/07/2017 1344      Component Value Date/Time   CALCIUM 9.6 03/25/2021 1109   CALCIUM 9.1 06/07/2017 1344   ALKPHOS 74 03/25/2021 1109   ALKPHOS 64 06/07/2017 1344   AST 21 03/25/2021 1109   AST 32 02/26/2019 1111   AST 21 06/07/2017 1344   ALT 22 03/25/2021 1109   ALT 34 02/26/2019 1111   ALT 20 06/07/2017 1344   BILITOT <0.2 (L) 03/25/2021 1109   BILITOT 0.3 02/26/2019 1111   BILITOT 0.29 06/07/2017 1344       No results found for: LABCA2  No components found for: LABCA125  No results for input(s): INR in the last 168 hours.  Urinalysis No results found for: COLORURINE, APPEARANCEUR, LABSPEC, PHURINE, GLUCOSEU, HGBUR, BILIRUBINUR, KETONESUR, PROTEINUR, UROBILINOGEN, NITRITE, LEUKOCYTESUR   STUDIES: Home sleep test  Result Date: 03/16/2021 Dohmeier, Carmen, MD     03/24/2021  5:26 PM [facility]  HOME SLEEP TEST REPORT ( by Watch PAT)  STUDY DATE: 03-21-2021  DOB:  05/31/1956 MRN: [id number]  ORDERING CLINICIAN: Carmen Dohmeier, MD REFERRING CLINICIAN: Dr Bouska, MD  CLINICAL INFORMATION/HISTORY:  01/07/2021 from PCP. Her late husband ( who passed in 2021)  had often mentioned her snoring. Recently she went to a nursing conference and shared a room with colleagues, who have made her aware of her apnea.  She has some sleepiness in daytime, when inactive.  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 She has a past medical history of Adult ADHD, Breast cancer (HCC) (2018), Family history of breast cancer, Family history of colon cancer,  Genetic testing (08/02/2016), History of radiation therapy (10/10/16-11/24/16), Hypercholesteremia, Personal history of radiation therapy (2018), and PONV (postoperative nausea and vomiting).   Epworth sleepiness score: 14/24.  BMI: 33.3 kg/m  Neck Circumference: 16"  FINDINGS:  Sleep Summary:  Total Recording Time (hours, min): 7 hours and 37 minutes of which the total sleep time was estimated 6 hours and 55 minutes.  The proportion of REM sleep was 11.9%                    Respiratory Indices:  Calculated pAHI (per hour): The total apnea hypopnea index was 24.7/h. During REM sleep this amounted to 37.1/h and in non-REM REM sleep to 23.0/h.                                                Supine AHI was 47.2/h over a total sleep duration in supine sleep of 63.5   minutes.  Nonsupine AHI was 20.7.  The patient slept in nonsupine position for 350.5 minutes.  The snoring level was elevated with a mean volume of 42 dB.                                               Oxygen Saturation Statistics:  Oxygen Saturation (%) Mean: Oxygen saturation ranged between a nadir at 86% saturation to a maximum of 98%.  Mean desaturation was 92%. Total sleep time in hypoxia below 89% saturation was 0.7 minutes the equivalent of 8.2% of total sleep time.           Pulse Rate Statistics:  Pulse Range: Between 59 and 112 beats per minute.  Mean heart rate was 80 bpm.          IMPRESSION:  This HST confirms the presence of moderately severe sleep apnea with REM sleep accentuation and also significant exacerbation during supine  sleep.  In spite of the lack of hypoxia being present this patient should consider AUTO- titration CPAP therapy.  I recommend a setting between 6 and 16 cmH2O pressure was 2 cm EPR, heated humidification, and a mask of patient's choice.  I do prefer a mask and headgear that allows the patient to sleep on her side.  RECOMMENDATION: I recommend a setting between 6 and 16 cmH2O pressure was 2 cm EPR, heated humidification, and a mask of patient's choice.  I do prefer a mask and headgear that allows the patient to sleep on her side. If the patient has difficulties to adjusting to CPAP I would consider a for her to use an inspire device she would have to lose weight to fit into the qualifying range for BMI of 32 or less.  The patient should avoid sleeping in supine position and she would have a chance to reduce her overall AHI by about 70%  should she choose this pathway.  Again,the part of REM sleep dependent apnea is not responsive to inspire therapy.  INTERPRETING PHYSICIAN: Carmen Dohmeier, MD Medical Director of [facility].      ELIGIBLE FOR AVAILABLE RESEARCH PROTOCOL: no  ASSESSMENT: 65 y.o. Felicia Hampton woman status post left breast upper inner quadrant biopsy 07/12/2016 for a clinical T1b N0, stage IA invasive ductal carcinoma, grade 1 estrogen and progesterone receptor strongly positive, HER-2 not amplified, with an MIB-1 of 2%  (a) biopsy of an area of non-masslike enhancement in the upper outer quadrant 07/12/2016 showed atypical lobular hyperplasia  (1) genetics testing through the [facility]'s STAT breast panel 07/21/2016 found no deleterious mutations in ATM, BRCA1, BRCA2, CDH1, CHEK2, PALB2, PTEN, STK11, TP53. There were also no deleterious mutations in the 43 genes on [facility]'s Common Cancers panel (APC, ATM, AXIN2, BARD1, BMPR1A, BRCA1, BRCA2, BRIP1, CDH1, CDKN2A, CHEK2, DICER1, EPCAM, GREM1, HOXB13, KIT, MEN1, MLH1, MSH2, MSH6, MUTYH, NBN, NF1, PALB2, PDGFRA, PMS2, POLD1, POLE, PTEN,  RAD50, RAD51C, RAD51D, SDHA, SDHB, SDHC, SDHD, SMAD4, SMARCA4, STK11, TP53, TSC1, TSC2, VHL  (2)  Status post left lumpectomy and sentinel lymph node sampling 08/11/2016 for a pT1a pN0, stage IA  Invasive ductal carcinoma, grade 1, repeat HER-2 testing again negative  (a)   Pathology from bilateral mammoplastic surgery 08/23/2016 was benign (including lumpectomy site)  (3) given the tumor size, low grade and low proliferation fraction, chemotherapy is not indicated  (4) adjuvant radiation 10/10/16 - 11/24/16 :   Left Breast treated to 50.4 Gy in 28 fractions, then Boosted an additional 12 Gy in 6 fractions  (5) started tamoxifen June 2018  (6) osteopenia, with T score of -1.8 on bone density scan 02/02/2018   PLAN: Ersa i is now 4-1/2 years out from definitive surgery for her breast cancer with no evidence of disease recurrence.  This is very favorable.  She will complete 5 years of tamoxifen in June 2023.  In her case I think it might be prudent to send a breast cancer index to try to help us decide if she should continue tamoxifen or discontinue it at 5 years.  She will see us again in June to make that decision.  Total encounter time 25 minutes.*   Magrinat, Gustav C, MD  03/25/21 7:15 PM Medical Oncology and Hematology [facility] [address] [place], [place] [address] Tel. [phone]    Fax. [phone]   I, Katie Daubenspeck, am acting as scribe for Dr. Gustav C. Magrinat.  I, Gustav Magrinat MD, have reviewed the above documentation for accuracy and completeness, and I agree with the above.   *Total Encounter Time as defined by the Centers for Medicare and Medicaid Services includes, in addition to the face-to-face time of a patient visit (documented in the note above) non-face-to-face time: obtaining and reviewing outside history, ordering and reviewing medications, tests or procedures, care coordination (communications with other health care professionals  or caregivers) and documentation in the medical record.  

## 2021-03-24 NOTE — Progress Notes (Signed)
[facility] TEST REPORT ( by Watch PAT)   STUDY DATE: 03-21-2021   DOB:  Oct 30, 1955 MRN: [id number]   ORDERING CLINICIAN: Larey Seat, MD  REFERRING CLINICIAN: Dr Coletta Memos, MD    CLINICAL INFORMATION/HISTORY:  01/07/2021 from PCP. Her late husband ( who passed in 2021) had often mentioned her snoring. Recently she went to a nursing conference and shared a room with colleagues, who have made her aware of her apnea.  She has some sleepiness in daytime, when inactive.    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 She has a past medical history of Adult ADHD, Breast cancer ([place]) (2018), Family history of breast cancer, Family history of colon cancer,  Genetic testing (08/02/2016), History of radiation therapy (10/10/16-11/24/16), Hypercholesteremia, Personal history of radiation therapy (2018), and PONV (postoperative nausea and vomiting).      Epworth sleepiness score: 14/24.   BMI: 33.3 kg/m   Neck Circumference: 16"   FINDINGS:   Sleep Summary:   Total Recording Time (hours, min): 7 hours and 37 minutes of which the total sleep time was estimated 6 hours and 55 minutes.  The proportion of REM sleep was 11.9%                      Respiratory Indices:   Calculated pAHI (per hour): The total apnea hypopnea index was 24.7/h. During REM sleep this amounted to 37.1/h and in non-REM REM sleep to 23.0/h.                                                  Supine AHI was 47.2/h over a total sleep duration in supine sleep of 63.5 minutes.  Nonsupine AHI was 20.7.  The patient slept in nonsupine position for 350.5 minutes.   The snoring level was elevated with a mean volume of 42 dB.                                                 Oxygen Saturation Statistics:   Oxygen Saturation (%) Mean: Oxygen saturation ranged between a nadir at 86% saturation to a maximum of 98%.  Mean desaturation was 92%.  Total sleep time in hypoxia  below 89% saturation was 0.7 minutes the equivalent of 8.2% of total sleep time.             Pulse Rate Statistics:  Pulse Range: Between 59 and 112 beats per minute.  Mean heart rate was 80 bpm.            IMPRESSION:  This HST confirms the presence of moderately severe sleep apnea with REM sleep accentuation and also significant exacerbation during supine sleep.  In spite of the lack of hypoxia being present this patient should consider AUTO- titration CPAP therapy.   I recommend a setting between 6 and 16 cmH2O pressure was 2 cm EPR, heated humidification, and a mask of patient's choice.   I do prefer a mask and headgear that allows the patient to sleep on her side.   RECOMMENDATION: I recommend a setting between 6 and 16 cmH2O pressure was 2 cm EPR, heated  humidification, and a mask of patient's choice.  I do prefer a mask and headgear that allows the patient to sleep on her side.  If the patient has difficulties to adjusting to CPAP I would consider a for her to use an inspire device she would have to lose weight to fit into the qualifying range for BMI of 32 or less.   The patient should avoid sleeping in supine position and she would have a chance to reduce her overall AHI by about 70%  should she choose this pathway.    Again,the part of REM sleep dependent apnea is not responsive to inspire therapy.    INTERPRETING PHYSICIAN: Larey Seat, MD   Medical Director of [facility].

## 2021-03-24 NOTE — Addendum Note (Signed)
Addended by: Larey Seat on: 03/24/2021 05:29 PM   Modules accepted: Orders

## 2021-03-24 NOTE — Progress Notes (Signed)
IMPRESSION:  This HST confirms the presence of moderately severe sleep apnea with REM sleep accentuation and also significant exacerbation during supine sleep.  In spite of the lack of hypoxia being present this patient should consider AUTO- titration CPAP therapy.   I recommend a setting between 6 and 16 cmH2O pressure was 2 cm EPR, heated humidification, and a mask of patient's choice.   I do prefer a mask and headgear that allows the patient to sleep on her side.  RECOMMENDATION: I recommend a setting between 6 and 16 cmH2O pressure was 2 cm EPR, heated humidification, and a mask of patient's choice.  I do prefer a mask and headgear that allows the patient to sleep on her side.  If the patient has difficulties to adjusting to CPAP I would consider a for her to use an inspire device she would have to lose weight to fit into the qualifying range for BMI of 32 or less.   The patient should avoid sleeping in supine position and she would have a chance to reduce her overall AHI by about 70%  should she choose this pathway.   Again,the part of REM sleep dependent apnea is not responsive to inspire therapy.

## 2021-03-24 NOTE — Procedures (Signed)
[facility] TEST REPORT ( by Watch PAT)   STUDY DATE: 03-21-2021   DOB:  [birth year] MRN: [id number]   ORDERING CLINICIAN: Larey Seat, MD  REFERRING CLINICIAN: Dr Coletta Memos, MD    CLINICAL INFORMATION/HISTORY:  01/07/2021 from PCP. Her late husband ( who passed in 2021) had often mentioned her snoring. Recently she went to a nursing conference and shared a room with colleagues, who have made her aware of her apnea.  She has some sleepiness in daytime, when inactive.    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 She has a past medical history of Adult ADHD, Breast cancer ([place]) (2018), Family history of breast cancer, Family history of colon cancer,  Genetic testing (08/02/2016), History of radiation therapy (10/10/16-11/24/16), Hypercholesteremia, Personal history of radiation therapy (2018), and PONV (postoperative nausea and vomiting).      Epworth sleepiness score: 14/24.   BMI: 33.3 kg/m   Neck Circumference: 16"   FINDINGS:   Sleep Summary:   Total Recording Time (hours, min): 7 hours and 37 minutes of which the total sleep time was estimated 6 hours and 55 minutes.  The proportion of REM sleep was 11.9%                      Respiratory Indices:   Calculated pAHI (per hour): The total apnea hypopnea index was 24.7/h. During REM sleep this amounted to 37.1/h and in non-REM REM sleep to 23.0/h.                                                  Supine AHI was 47.2/h over a total sleep duration in supine sleep of 63.5 minutes.  Nonsupine AHI was 20.7.  The patient slept in nonsupine position for 350.5 minutes.   The snoring level was elevated with a mean volume of 42 dB.                                                 Oxygen Saturation Statistics:   Oxygen Saturation (%) Mean: Oxygen saturation ranged between a nadir at 86% saturation to a maximum of 98%.  Mean desaturation was 92%.  Total sleep time in hypoxia below  89% saturation was 0.7 minutes the equivalent of 8.2% of total sleep time.             Pulse Rate Statistics:  Pulse Range: Between 59 and 112 beats per minute.  Mean heart rate was 80 bpm.            IMPRESSION:  This HST confirms the presence of moderately severe sleep apnea with REM sleep accentuation and also significant exacerbation during supine sleep.  In spite of the lack of hypoxia being present this patient should consider AUTO- titration CPAP therapy.   I recommend a setting between 6 and 16 cmH2O pressure was 2 cm EPR, heated humidification, and a mask of patient's choice.   I do prefer a mask and headgear that allows the patient to sleep on her side.   RECOMMENDATION: I recommend a setting between 6 and 16 cmH2O pressure was 2 cm EPR, heated humidification, and  a mask of patient's choice.  I do prefer a mask and headgear that allows the patient to sleep on her side.  If the patient has difficulties to adjusting to CPAP I would consider a for her to use an inspire device she would have to lose weight to fit into the qualifying range for BMI of 32 or less.   The patient should avoid sleeping in supine position and she would have a chance to reduce her overall AHI by about 70%  should she choose this pathway.    Again,the part of REM sleep dependent apnea is not responsive to inspire therapy.    INTERPRETING PHYSICIAN: Larey Seat, MD   Medical Director of [facility] at [facility].

## 2021-03-25 ENCOUNTER — Telehealth: Payer: Self-pay | Admitting: Neurology

## 2021-03-25 ENCOUNTER — Inpatient Hospital Stay: Payer: Medicare HMO | Admitting: Oncology

## 2021-03-25 ENCOUNTER — Encounter: Payer: Self-pay | Admitting: Neurology

## 2021-03-25 ENCOUNTER — Other Ambulatory Visit: Payer: Self-pay

## 2021-03-25 ENCOUNTER — Inpatient Hospital Stay: Payer: Medicare HMO | Attending: Oncology

## 2021-03-25 VITALS — BP 137/75 | HR 83 | Temp 97.7°F | Resp 18 | Ht 62.0 in | Wt 180.2 lb

## 2021-03-25 DIAGNOSIS — M8580 Other specified disorders of bone density and structure, unspecified site: Secondary | ICD-10-CM | POA: Insufficient documentation

## 2021-03-25 DIAGNOSIS — Z79810 Long term (current) use of selective estrogen receptor modulators (SERMs): Secondary | ICD-10-CM | POA: Insufficient documentation

## 2021-03-25 DIAGNOSIS — Z170 Estrogen receptor positive status [ER+]: Secondary | ICD-10-CM

## 2021-03-25 DIAGNOSIS — C50212 Malignant neoplasm of upper-inner quadrant of left female breast: Secondary | ICD-10-CM | POA: Insufficient documentation

## 2021-03-25 DIAGNOSIS — Z923 Personal history of irradiation: Secondary | ICD-10-CM | POA: Diagnosis not present

## 2021-03-25 LAB — CBC WITH DIFFERENTIAL/PLATELET
Abs Immature Granulocytes: 0.02 10*3/uL (ref 0.00–0.07)
Basophils Absolute: 0 10*3/uL (ref 0.0–0.1)
Basophils Relative: 0 %
Eosinophils Absolute: 0 10*3/uL (ref 0.0–0.5)
Eosinophils Relative: 0 %
HCT: 39.2 % (ref 36.0–46.0)
Hemoglobin: 13.3 g/dL (ref 12.0–15.0)
Immature Granulocytes: 0 %
Lymphocytes Relative: 9 %
Lymphs Abs: 0.8 10*3/uL (ref 0.7–4.0)
MCH: 28.6 pg (ref 26.0–34.0)
MCHC: 33.9 g/dL (ref 30.0–36.0)
MCV: 84.3 fL (ref 80.0–100.0)
Monocytes Absolute: 0.2 10*3/uL (ref 0.1–1.0)
Monocytes Relative: 2 %
Neutro Abs: 8 10*3/uL — ABNORMAL HIGH (ref 1.7–7.7)
Neutrophils Relative %: 89 %
Platelets: 321 10*3/uL (ref 150–400)
RBC: 4.65 MIL/uL (ref 3.87–5.11)
RDW: 12.3 % (ref 11.5–15.5)
WBC: 9 10*3/uL (ref 4.0–10.5)
nRBC: 0 % (ref 0.0–0.2)

## 2021-03-25 LAB — COMPREHENSIVE METABOLIC PANEL
ALT: 22 U/L (ref 0–44)
AST: 21 U/L (ref 15–41)
Albumin: 3.9 g/dL (ref 3.5–5.0)
Alkaline Phosphatase: 74 U/L (ref 38–126)
Anion gap: 10 (ref 5–15)
BUN: 11 mg/dL (ref 8–23)
CO2: 23 mmol/L (ref 22–32)
Calcium: 9.6 mg/dL (ref 8.9–10.3)
Chloride: 106 mmol/L (ref 98–111)
Creatinine, Ser: 0.73 mg/dL (ref 0.44–1.00)
GFR, Estimated: >60 mL/min (ref >60)
Glucose, Bld: 143 mg/dL — ABNORMAL HIGH (ref 70–99)
Potassium: 4.2 mmol/L (ref 3.5–5.1)
Sodium: 139 mmol/L (ref 135–145)
Total Bilirubin: <0.2 mg/dL — ABNORMAL LOW (ref 0.3–1.2)
Total Protein: 7.3 g/dL (ref 6.5–8.1)

## 2021-03-25 MED ORDER — TAMOXIFEN CITRATE 20 MG PO TABS: 20.0000 mg | ORAL_TABLET | Freq: Every day | ORAL | 4 refills | Status: DC

## 2021-03-25 NOTE — Telephone Encounter (Signed)
Pt returned call.I advised pt that Dr. Brett Fairy reviewed their sleep study results and found that pt has moderate to severe sleep apnea. Dr. Brett Fairy recommends that pt [facility]. I reviewed PAP compliance expectations with the pt. Pt is agreeable to starting a CPAP. I advised pt that an order will be sent to a DME, [facility], and [facility] will call the pt within about one week after they file with the pt's insurance. [facility] will show the pt how to use the machine, fit for masks, and troubleshoot the CPAP if needed. A follow up appt was made for insurance purposes with Dr. Brett Fairy on 06/29/21 at 8:30 am. Pt verbalized understanding to arrive 15 minutes early and bring their CPAP. A letter with all of this information in it will be mailed to the pt as a reminder. I verified with the pt that the address we have on file is correct. Pt verbalized understanding of results. Pt had no questions at this time but was encouraged to call back if questions arise. I have sent the order to [facility] and have received confirmation that they have received the order.

## 2021-03-25 NOTE — Telephone Encounter (Signed)
Called patient to discuss sleep study results. No answer at this time. LVM for the patient to call back.      Result from the HST: "This HST confirms the presence of moderately severe sleep apnea with REM sleep accentuation and also significant exacerbation during supine sleep.  In spite of the lack of hypoxia being present this patient should consider AUTO- titration CPAP therapy.   I recommend a setting between 6 and 16 cmH2O pressure was 2 cm EPR, heated humidification, and a mask of patient's choice.   I do prefer a mask and headgear that allows the patient to sleep on her side."

## 2021-04-06 ENCOUNTER — Telehealth: Payer: Self-pay | Admitting: *Deleted

## 2021-04-06 NOTE — Telephone Encounter (Signed)
Received order for BCI testing. Requisition faxed to pathology and [facility]

## 2021-04-19 DIAGNOSIS — Z170 Estrogen receptor positive status [ER+]: Secondary | ICD-10-CM | POA: Diagnosis not present

## 2021-04-19 DIAGNOSIS — C50212 Malignant neoplasm of upper-inner quadrant of left female breast: Secondary | ICD-10-CM | POA: Diagnosis not present

## 2021-04-21 ENCOUNTER — Other Ambulatory Visit: Payer: Self-pay | Admitting: Oncology

## 2021-04-21 ENCOUNTER — Encounter: Payer: Self-pay | Admitting: Oncology

## 2021-04-30 ENCOUNTER — Encounter: Payer: Self-pay | Admitting: Oncology

## 2021-05-24 ENCOUNTER — Other Ambulatory Visit: Payer: Self-pay | Admitting: Oncology

## 2021-05-24 DIAGNOSIS — Z853 Personal history of malignant neoplasm of breast: Secondary | ICD-10-CM

## 2021-05-26 DIAGNOSIS — G4733 Obstructive sleep apnea (adult) (pediatric): Secondary | ICD-10-CM | POA: Diagnosis not present

## 2021-06-01 DIAGNOSIS — L570 Actinic keratosis: Secondary | ICD-10-CM | POA: Diagnosis not present

## 2021-06-01 DIAGNOSIS — D485 Neoplasm of uncertain behavior of skin: Secondary | ICD-10-CM | POA: Diagnosis not present

## 2021-06-01 DIAGNOSIS — L814 Other melanin hyperpigmentation: Secondary | ICD-10-CM | POA: Diagnosis not present

## 2021-06-01 DIAGNOSIS — Z85828 Personal history of other malignant neoplasm of skin: Secondary | ICD-10-CM | POA: Diagnosis not present

## 2021-06-01 DIAGNOSIS — L821 Other seborrheic keratosis: Secondary | ICD-10-CM | POA: Diagnosis not present

## 2021-06-03 ENCOUNTER — Other Ambulatory Visit: Payer: Self-pay | Admitting: Oncology

## 2021-06-24 ENCOUNTER — Ambulatory Visit
Admission: RE | Admit: 2021-06-24 | Discharge: 2021-06-24 | Disposition: A | Discharge status: Home / Self Care | Payer: Medicare HMO | Source: Ambulatory Visit | Attending: Oncology | Admitting: Oncology

## 2021-06-24 ENCOUNTER — Other Ambulatory Visit: Payer: Self-pay | Admitting: Oncology

## 2021-06-24 DIAGNOSIS — Z1231 Encounter for screening mammogram for malignant neoplasm of breast: Secondary | ICD-10-CM | POA: Diagnosis not present

## 2021-06-24 DIAGNOSIS — Z853 Personal history of malignant neoplasm of breast: Secondary | ICD-10-CM

## 2021-06-25 DIAGNOSIS — G4733 Obstructive sleep apnea (adult) (pediatric): Secondary | ICD-10-CM | POA: Diagnosis not present

## 2021-06-29 ENCOUNTER — Encounter: Payer: Self-pay | Admitting: Neurology

## 2021-06-29 ENCOUNTER — Ambulatory Visit: Payer: Medicare HMO | Admitting: Neurology

## 2021-06-29 VITALS — BP 159/86 | HR 96 | Ht 62.0 in | Wt 184.0 lb

## 2021-06-29 DIAGNOSIS — G4733 Obstructive sleep apnea (adult) (pediatric): Secondary | ICD-10-CM

## 2021-06-29 DIAGNOSIS — J300 Vasomotor rhinitis: Secondary | ICD-10-CM

## 2021-06-29 DIAGNOSIS — N951 Menopausal and female climacteric states: Secondary | ICD-10-CM | POA: Diagnosis not present

## 2021-06-29 MED ORDER — TRIAMCINOLONE ACETONIDE 55 MCG/ACT NA AERO: 2.0000 | INHALATION_SPRAY | Freq: Every day | NASAL | 12 refills | Status: AC

## 2021-06-29 NOTE — Patient Instructions (Signed)
Nonallergic Rhinitis Nonallergic rhinitis is inflammation of the mucous membrane inside the nose. The mucous membrane is the tissue that produces mucus. This condition is different from having allergic rhinitis, which is an allergy that affects the nose. Allergic rhinitis occurs when the body's defense system, or immune system, reacts to a substance that a person is allergic to (allergen), such as pollen, pet dander, mold, or dust. Nonallergic rhinitis has many similar symptoms, but it is not caused by allergens. Nonallergic rhinitis can be an acute or chronic problem. This means it can be short-term or long-term. What are the causes? This condition may be caused by many different things. Some common types of nonallergic rhinitis include: Infectious rhinitis. This is usually caused by an infection in the nose, throat, or upper airways (upper respiratory system). Vasomotor rhinitis. This is the most common type of chronic nonallergic rhinitis. It is caused by too much blood flow through your nose, and it leads to swelling in your nose. It is triggered by strong odors, cold air, stress, drinking alcohol, cigarette smoke, or changes in the weather. Occupational rhinitis. This type is caused by triggers in the workplace, such as chemicals, dust, animal dander, or air pollution. Hormonal rhinitis, in teenage girls and women. This type is caused by an increase in the hormone estrogen and may happen during pregnancy, puberty, or monthly menstrual periods. Hormonal rhinitis gives you fewer symptoms when estrogen levels drop. Drug-induced rhinitis. Several types of medicines can cause this, such as medicines for high blood pressure or heart disease, aspirin, or NSAIDs. Nonallergic rhinitis with eosinophilia syndrome (NARES). This type is caused by having too much eosinophil, a type of white blood cell. Other causes include a reaction to eating hot or spicy foods. This does not usually cause long-term symptoms. In  some cases, the cause of nonallergic rhinitis is not known. What increases the risk? You are more likely to develop this condition if: You are 21-63 years of age. You are a woman. Women are twice as likely to have this condition. What are the signs or symptoms? Common symptoms of this condition include: Stuffy nose (nasal congestion). Runny nose. A feeling of mucus dripping down the back of your throat (postnasal drip). Trouble sleeping. Tiredness, or fatigue. Other symptoms include: Sneezing. Coughing. Itchy nose. Bloodshot eyes. How is this diagnosed? This type may be diagnosed based on: Your symptoms and medical history. A physical exam. Allergy testing to rule out allergic rhinitis. You may have skin tests or blood tests. Your health care provider may also take a swab of nasal discharge to look for an increased number of eosinophils. This would be done to confirm a diagnosis of NARES. How is this treated? Treatment for this condition depends on the cause. No single treatment works for everyone. Work with your health care provider to find the best treatment for you. Treatment may include: Avoiding the things that trigger your symptoms. Medicines to relieve congestion, such as: Steroid nasal spray. There are many types. You may need to try a few to find out which one works best. Best boy medicine. This treats nasal congestion and may be given by mouth or as a nasal spray. These medicines are used only for a short time. Medicines to relieve a runny nose. These may include antihistamine medicines or anticholinergic nasal sprays. Nasal irrigation. This involves using a salt-water (saline) spray or saline container called a neti pot. Nasal irrigation helps to clear away mucus and keep your nasal passages moist. Surgery to remove part  of your mucous membrane. This is done in severe cases if the condition has not improved after 6-12 months of treatment. Follow these instructions at  home: Medicines Take or use over-the-counter and prescription medicines only as told by your health care provider. Do not stop using your medicine even if you start to feel better. Do not take NSAIDs, such as ibuprofen, or medicines that contain aspirin if they make your symptoms worse. Lifestyle Do not drink alcohol if it makes your symptoms worse. Do not use any products that contain nicotine or tobacco, such as cigarettes, e-cigarettes, and chewing tobacco. If you need help quitting, ask your health care provider. Avoid secondhand smoke. General instructions Avoid triggers that make your symptoms worse. Use nasal irrigation as told by your health care provider. Get exercise. Exercise may help reduce symptoms for some people. Sleep with the head of your bed raised. This may reduce nasal congestion when you sleep. Drink enough fluid to keep your urine pale yellow. Keep all follow-up visits as told by your health care provider. This is important. Contact a health care provider if: You have a fever. Your symptoms are getting worse at home. Your symptoms do not lessen with medicine. You develop new symptoms, especially a headache or nosebleed. Summary Nonallergic rhinitis is inflammation inside the nose that is not caused by allergens. Nonallergic rhinitis can be a short-term or long-term problem. Treatment may include avoiding the things that trigger your symptoms. Take or use over-the-counter and prescription medicines only as told by your health care provider. Do not stop using your medicine even if you start to feel better. Contact a health care provider if your symptoms do not lessen with medicine. This information is not intended to replace advice given to you by your health care provider. Make sure you discuss any questions you have with your health care provider. Document Revised: 05/06/2019 Document Reviewed: 05/06/2019 Elsevier Patient Education  [employer].

## 2021-06-29 NOTE — Progress Notes (Signed)
SLEEP MEDICINE CLINIC    Provider:  Larey Seat, MD  Primary Care Physician:  Bernerd Limbo, MD [place] [address]     Referring Provider: Bernerd Limbo, [address],  [place] [id number]          Chief Complaint according to patient   Patient presents with:     New Patient (Initial Visit)           HISTORY OF PRESENT ILLNESS:  Felicia Hampton is a 65  year old [place]-Caucasian female patient and is seen here in a RV on 06/29/2021. Mrs. Felicia Hampton underwent a home sleep test through our lab the study dated already date was 03/22/2019 ; The patient endorsed the patient endorsed the Epworth sleepiness score at 14 out of 24 points, had a BMI of 33, per her home sleep test indicated a total AHI of 24.7 during REM sleep this amounted to 37.1 in supine sleep 47.2/h. So we recommended to treat with and with a auto titration CPAP 6-16 cmH2O to centimeter EPR heated humidification and mask of patient's choice allowing her to sleep on her side.  I have today the pleasure to see her on an auto titration and she has responded very well.  She is 97% compliant with CPAP use 29 out of 30 days and 27 of those days with over 4 hours of consecutive use average usage per hours is 6.9 hours at night.  She was set up 1 months and 4 days ago.  Today is 48 December.  Her serial number for the iron breeze machine Hgb-to be [id number] settings as quoted above residual AHI is 2.2/h.   The patient was provided a full facemask which is not exactly what I would have in mind however she developed a did not have before nasal congestion nasal congestion and she had 2 bouts of sinusitis she states since she has been on CPAP.  So either the humidity creates CPAP related rhinitis or we are dealing with some kind of allergy.  She does not have a history of allergic rhinitis.  Air should be filtered and not produce allergic rhinitis my goal is to reduce the  humidification by 2 scores for the next couple of days and see if that helps to allow a decongestion and if it is the other way around we will try to to put more humidity into it. We can also prep with nasocort or nasonex before bedtime.             01-07-2021: Originally referred from PCP. Her late husband had often mentioned her snoring. Recently she went to a nursing conference and shared a room with colleagues, and she was made aware of her apnea. She has some sleepiness in daytime, when inactive.    I have the pleasure of seeing Felicia Hampton today, a left-handed Caucasian femalen presenting with EDS and diaphoresis , loud snoring, obesity. All were  symptoms that can be attributed to OSA.   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 Her husband died in 2019-09-02.   She has a has a past medical history of Adult ADHD, Breast cancer ([place]) September 01, 2016), Family history of breast cancer,  Family history of colon cancer,  Genetic testing (08/02/2016), History of radiation therapy (10/10/16-11/24/16), Hypercholesteremia, Personal history of radiation therapy 2016/09/01), and PONV (postoperative nausea and vomiting).      Sleep relevant medical history:  Family medical /sleep history: no  other family member diagnosed with OSA.  Social history:  Patient is working as an Haematologist- returned to part time.  She lives in a household alone. No pets,  1 biological child.  The patient currently works daytime -Tobacco use: quit 32 years ago, was a 1 pack per week.   ETOH use: socially, Caffeine intake in form of Coffee( 1 cup of coffee ) Soda( /) Tea ( /) or energy drinks. Regular exercise in form of walking.  Hobbies : reading- rarely cooking now, bible study.       Sleep habits are as follows: The patient's dinner time is between 6-7 PM. The patient goes to bed at 10-10.30 PM and continues to sleep for 6 hours, wakes for one bathroom breaks, the first time at 5 AM.   The preferred sleep position is side and  prone , with the support of 2 pillows. Sleep number bed- adjustable. Dreams are reportedly rare- and often nightmares.  She often wakes up at an undesired time.  6  AM is the usual rise time. The patient wakes up spontaneously. He/ She reports not feeling refreshed or restored in AM, with symptoms such as dry mouth, morning residual fatigue. Naps are taken frequently, not in bed, lasting from 15 to 30 minutes and are more refreshing than nocturnal sleep.    Review of Systems: Out of a complete 14 system review, the patient complains of only the following symptoms, and all other reviewed systems are negative.:  Fatigue, sleepiness , snoring, fragmented sleep, Insomnia - early arousal.  Tamoxifen , effexor for hot flushes.  Breast cancer survivor.    How likely are you to doze in the following situations: 0 = not likely, 1 = slight chance, 2 = moderate chance, 3 = high chance   Sitting and Reading? Watching Television? Sitting inactive in a public place (theater or meeting)? As a passenger in a car for an hour without a break? Lying down in the afternoon when circumstances permit? Sitting and talking to someone? Sitting quietly after lunch without alcohol? In a car, while stopped for a few minutes in traffic?   Total = 14/ 24 points  with one or two naps a day,    FSS endorsed at 27/ 63 points.  GDS:  8 / 15   Social History   Socioeconomic History   Marital status: Single    Spouse name: Not on file   Number of children: 1   Years of education: Not on file   Highest education level: Not on file  Occupational History   Occupation: RN  Tobacco Use   Smoking status: Former    Packs/day: 0.50    Years: 10.00    Pack years: 5.00    Types: Cigarettes    Quit date: 08/02/1988    Years since quitting: 32.9   Smokeless tobacco: Never  Substance and Sexual Activity   Alcohol use: Yes    Comment: socially   Drug use: No   Sexual activity: Not on file  Other Topics Concern   Not  on file  Social History Narrative   Left handed   1 cup coffee every morning   Social Determinants of Health   Financial Resource Strain: Not on file  Food Insecurity: Not on file  Transportation Needs: Not on file  Physical Activity: Not on file  Stress: Not on file  Social Connections: Not on file    Family History  Problem Relation  Age of Onset   Colon cancer Sister 44       deceased 43   Melanoma Brother        on back; currently 39   Ovarian cancer Maternal Aunt    Colon cancer Paternal Grandmother 96       deceased   Breast cancer Cousin 63       mat female cousin; daughter of unaffected mat aunt; deceased 11   Prostate cancer Father        deceased 55    Past Medical History:  Diagnosis Date   Adult ADHD    Breast cancer ([place]) 2018   Left Breast Cancer   Cancer ([place]) 2018   left breast   Family history of breast cancer    Family history of colon cancer    Family history of ovarian cancer    Genetic testing 08/02/2016   Negative; No pathogenic mutations detected. STAT Breast panel with reflex to Common Cancers panel  Genes tested were the 43 genes on [facility]'s Common Cancers panel (APC, ATM, AXIN2, BARD1, BMPR1A, BRCA1, BRCA2, BRIP1, CDH1, CDKN2A, CHEK2, DICER1, EPCAM, GREM1, HOXB13, KIT, MEN1, MLH1, MSH2, MSH6, MUTYH, NBN, NF1, PALB2, PDGFRA, PMS2, POLD1, POLE, PTEN, RAD50, RAD51C, RAD51D, SDHA, SDHB, SDHC, SDHD   History of radiation therapy 10/10/16-11/24/16   left breast 50.4 Gy in 28 fractions, left breast boost 12 Gy in 6 fractions   Hypercholesteremia    Personal history of radiation therapy 2018   Left Breast Cancer   PONV (postoperative nausea and vomiting)     Past Surgical History:  Procedure Laterality Date   ABDOMINAL HYSTERECTOMY     BREAST LUMPECTOMY Left 08/11/2016   BREAST REDUCTION SURGERY Bilateral 08/23/2016   Procedure: BILATERAL ONCOPLASTIC BREAST REDUCTION;  Surgeon: Irene Limbo, MD;  Location: [facility];  Service:  Plastics;  Laterality: Bilateral;   KNEE ARTHROSCOPY     x3   RADIOACTIVE SEED GUIDED PARTIAL MASTECTOMY WITH AXILLARY SENTINEL LYMPH NODE BIOPSY Left 08/11/2016   Procedure: RADIOACTIVE SEED GUIDED LEFT BREAST LUMPECTOMY WITH AXILLARY SENTINEL LYMPH NODE BIOPSY;  Surgeon: Rolm Bookbinder, MD;  Location: [facility];  Service: General;  Laterality: Left;   REDUCTION MAMMAPLASTY     SHOULDER ARTHROSCOPY W/ ROTATOR CUFF REPAIR Right      Current Outpatient Medications on File Prior to Visit  Medication Sig Dispense Refill   atorvastatin (LIPITOR) 20 MG tablet Take 20 mg by mouth daily at 6 PM.     meloxicam (MOBIC) 15 MG tablet TAKE 1 TABLET BY MOUTH EVERY DAY (Patient taking differently: Take 15 mg by mouth as needed.) 90 tablet 0   Multiple Vitamin (MULTIVITAMIN) capsule Take by mouth.     tamoxifen (NOLVADEX) 20 MG tablet Take 1 tablet (20 mg total) by mouth daily. 90 tablet 4   venlafaxine XR (EFFEXOR-XR) 150 MG 24 hr capsule TAKE 1 CAPSULE BY MOUTH DAILY WITH BREAKFAST. 90 capsule 4   Vitamin D, Ergocalciferol, (DRISDOL) 1.25 MG (50000 UNIT) CAPS capsule Take 50,000 Units by mouth once a week.     zolpidem (AMBIEN) 10 MG tablet Take 10 mg by mouth at bedtime as needed for sleep.     No current facility-administered medications on file prior to visit.    Allergies  Allergen Reactions   Ace Inhibitors Palpitations and Shortness Of Breath    COX-2 inhibitors   Diclofenac     Other reaction(s): Hypotension (ALLERGY/intolerance)   Morphine Nausea And Vomiting   Morphine And Related  Nausea And Vomiting   Oxycodone Other (See Comments)    hallucinations    Physical exam:  Today's Vitals   06/29/21 0825  BP: (!) 159/86  Pulse: 96  SpO2: 97%  Weight: 184 lb (83.5 kg)  Height: _0  (1.575 m)   Body mass index is 33.65 kg/m.   Wt Readings from Last 3 Encounters:  06/29/21 184 lb (83.5 kg)  03/25/21 180 lb 3.2 oz (81.7 kg)  01/07/21 181 lb (82.1 kg)     Ht  Readings from Last 3 Encounters:  06/29/21 _1  (1.575 m)  03/25/21 _2  (1.575 m)  01/07/21 _3  (1.575 m)      General: The patient is awake, alert and appears not in acute distress. The patient is well groomed. Head: Normocephalic, atraumatic. Neck is supple.  Mallampati 1,  neck circumference:16 inches . Nasal airflow  patent.  the patient is diaphoretic.  Trunk: The patient's posture is erect.   Neurologic exam : The patient is awake and alert, oriented to place and time.   Memory subjective described as intact.  Attention span & concentration ability appears normal.  Speech is fluent,  without  dysarthria, dysphonia or aphasia.  Mood and affect are appropriate.   Cranial nerves: no loss of smell or taste reported  Pupils are equal and briskly reactive to light. Funduscopic exam deferred. .  Extraocular movements in vertical and horizontal planes were intact and without nystagmus. No Diplopia. Visual fields by finger perimetry are intact. Hearing was intact to soft voice and finger rubbing.    Facial sensation intact to fine touch.  Facial motor strength is symmetric and tongue and uvula move midline.  Neck ROM : rotation, tilt and flexion extension were normal for age and shoulder shrug was symmetrical.    Motor exam:  Symmetric bulk, tone and ROM.   Normal tone without cog -wheeling, right sided reduced grip strength . Sensory:  Fine touch, pinprick and vibration were  normal.  Proprioception tested in the upper extremities was normal. Coordination: Rapid alternating movements in the fingers/hands were of normal speed. Arthritis at the thumb joint.  The Finger-to-nose maneuver was intact without evidence of ataxia, dysmetria or tremor. Gait and station: Patient could rise unassisted from a seated position, walked without assistive device.  Stance is of normal width/ base and the patient turned with 3 steps.  Toe and heel walk were deferred.  Deep tendon reflexes: in the   upper and lower extremities are symmetric and intact.  Babinski response was deferred /.     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  This has given her some hot flashes kind of aggressive menopausal symptoms and these also affect the way she sleeps.  She lost her husband in 2021 who struggled with Alzheimer's dementia, she has begun to work again at least part-time as an Haematologist and she is socially well connected as her son and 3 young grandchildren are living close by.  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   She  is happy with her new CPAP experience but reported naal congestion and sinus congestion-  She is having much less sleepiness now , Epworth dropped to 8 points/ 24  from pre CPAP at 14 points.    My Plan is to proceed with:  1)CPAP continuation under current settings, but trying a higher and lower humidification setting,  2) I  usually prefer trazodone as a sleep inducing medication that is not addictive over Ambien but this can interfere with   Effexor or other serotonin reuptake inhibitors so we will not change anything here today.  3) nasal mask  or pillow in the future.    I would like to thank Bernerd Limbo, MD and or allowing me to meet with and to take care of this pleasant patient.    I plan to follow up either personally or through our NP within 12 month.   CC: I will share my notes with PCP, Dr Coletta Memos.  Electronically signed by: Larey Seat, MD 06/29/2021 8:51 AM  [facility] [employer] Board certified by The [employer] of Sleep Medicine and Diplomate of the [employer] of Sleep Medicine. Board certified In Neurology through the [place], Fellow of the [employer] of Neurology. Medical Director of [employer].

## 2021-07-22 ENCOUNTER — Encounter (HOSPITAL_COMMUNITY): Payer: Self-pay

## 2021-11-29 ENCOUNTER — Telehealth: Payer: Self-pay | Admitting: Hematology and Oncology

## 2021-11-29 NOTE — Telephone Encounter (Signed)
Rescheduled appointment per provider request. Left message.

## 2021-12-23 ENCOUNTER — Ambulatory Visit: Payer: Medicare HMO | Admitting: Hematology and Oncology

## 2021-12-23 ENCOUNTER — Other Ambulatory Visit: Payer: Medicare HMO

## 2022-01-20 ENCOUNTER — Other Ambulatory Visit: Payer: Medicare HMO

## 2022-01-20 ENCOUNTER — Ambulatory Visit: Payer: Medicare HMO | Admitting: Hematology and Oncology

## 2022-01-26 ENCOUNTER — Other Ambulatory Visit: Payer: Self-pay | Admitting: *Deleted

## 2022-01-26 DIAGNOSIS — C50212 Malignant neoplasm of upper-inner quadrant of left female breast: Secondary | ICD-10-CM

## 2022-01-27 ENCOUNTER — Inpatient Hospital Stay: Payer: Medicare HMO | Attending: Hematology and Oncology

## 2022-01-27 ENCOUNTER — Other Ambulatory Visit: Payer: Self-pay

## 2022-01-27 ENCOUNTER — Inpatient Hospital Stay: Payer: Medicare HMO | Admitting: Hematology and Oncology

## 2022-01-27 DIAGNOSIS — Z79810 Long term (current) use of selective estrogen receptor modulators (SERMs): Secondary | ICD-10-CM | POA: Diagnosis not present

## 2022-01-27 DIAGNOSIS — C50212 Malignant neoplasm of upper-inner quadrant of left female breast: Secondary | ICD-10-CM | POA: Insufficient documentation

## 2022-01-27 DIAGNOSIS — Z170 Estrogen receptor positive status [ER+]: Secondary | ICD-10-CM | POA: Diagnosis not present

## 2022-01-27 LAB — CMP (CANCER CENTER ONLY)
ALT: 25 U/L (ref 0–44)
AST: 26 U/L (ref 15–41)
Albumin: 4 g/dL (ref 3.5–5.0)
Alkaline Phosphatase: 65 U/L (ref 38–126)
Anion gap: 5 (ref 5–15)
BUN: 14 mg/dL (ref 8–23)
CO2: 30 mmol/L (ref 22–32)
Calcium: 9.2 mg/dL (ref 8.9–10.3)
Chloride: 105 mmol/L (ref 98–111)
Creatinine: 0.62 mg/dL (ref 0.44–1.00)
GFR, Estimated: >60 mL/min (ref >60)
Glucose, Bld: 123 mg/dL — ABNORMAL HIGH (ref 70–99)
Potassium: 4.3 mmol/L (ref 3.5–5.1)
Sodium: 140 mmol/L (ref 135–145)
Total Bilirubin: 0.3 mg/dL (ref 0.3–1.2)
Total Protein: 6.6 g/dL (ref 6.5–8.1)

## 2022-01-27 LAB — CBC WITH DIFFERENTIAL (CANCER CENTER ONLY)
Abs Immature Granulocytes: 0.01 10*3/uL (ref 0.00–0.07)
Basophils Absolute: 0.1 10*3/uL (ref 0.0–0.1)
Basophils Relative: 1 %
Eosinophils Absolute: 0.1 10*3/uL (ref 0.0–0.5)
Eosinophils Relative: 2 %
HCT: 38.6 % (ref 36.0–46.0)
Hemoglobin: 13.1 g/dL (ref 12.0–15.0)
Immature Granulocytes: 0 %
Lymphocytes Relative: 28 %
Lymphs Abs: 1.4 10*3/uL (ref 0.7–4.0)
MCH: 29.4 pg (ref 26.0–34.0)
MCHC: 33.9 g/dL (ref 30.0–36.0)
MCV: 86.7 fL (ref 80.0–100.0)
Monocytes Absolute: 0.4 10*3/uL (ref 0.1–1.0)
Monocytes Relative: 9 %
Neutro Abs: 3 10*3/uL (ref 1.7–7.7)
Neutrophils Relative %: 60 %
Platelet Count: 275 10*3/uL (ref 150–400)
RBC: 4.45 MIL/uL (ref 3.87–5.11)
RDW: 12.7 % (ref 11.5–15.5)
WBC Count: 5 10*3/uL (ref 4.0–10.5)
nRBC: 0 % (ref 0.0–0.2)

## 2022-01-27 MED ORDER — VENLAFAXINE HCL ER 75 MG PO CP24: 75.0000 mg | ORAL_CAPSULE | Freq: Every day | ORAL | 1 refills | Status: DC

## 2022-01-27 MED ORDER — MELOXICAM 15 MG PO TABS: 15.0000 mg | ORAL_TABLET | ORAL | Status: DC | PRN

## 2022-01-27 NOTE — Progress Notes (Signed)
Patient Care Team: Bernerd Limbo, MD as PCP - General (Family Medicine) Magrinat, Virgie Dad, MD (Inactive) as Consulting Physician (Oncology) Rolm Bookbinder, MD as Consulting Physician (General Surgery) Maisie Fus, MD as Consulting Physician (Obstetrics and Gynecology) Irene Limbo, MD as Consulting Physician (Plastic Surgery) Delice Bison Charlestine Massed, NP as Nurse Practitioner (Hematology and Oncology) Lyndal Pulley, DO as Consulting Physician (Family Medicine)  DIAGNOSIS:  Encounter Diagnosis  Name Primary?   Malignant neoplasm of upper-inner quadrant of left breast in female, estrogen receptor positive ([place])     SUMMARY OF ONCOLOGIC HISTORY: Oncology History  Malignant neoplasm of upper-inner quadrant of left breast in female, estrogen receptor positive ([place])  07/12/2016 Initial Biopsy   Left breast needle core biopsy UIQ: IDC, lobular neoplasia, grade 1, ER+(100%), PR+(100%), Ki-67 2%, HER-2 negative (ratio 1.89).    07/26/2016 Initial Diagnosis   Malignant neoplasm of upper-inner quadrant of left breast in female, estrogen receptor positive ([place])   08/11/2016 Surgery   Left breast lumpectomy: IDC, 0.5cm, grade 1, atypical lobular hyperplasia, margins neg, T1a, N0   10/10/2016 - 11/24/2016 Radiation Therapy   Adjuvant radiation ([place]): Left Breast treated to 50.4 Gy in 28 fractions, and then Boosted an additional 12 Gy in 6 fractions   11/2016 -  Anti-estrogen oral therapy   Tamoxifen daily     CHIEF COMPLIANT:   INTERVAL HISTORY: Felicia Hampton is a   ALLERGIES:  is allergic to ace inhibitors, diclofenac, morphine, morphine and related, and oxycodone.  MEDICATIONS:  Current Outpatient Medications  Medication Sig Dispense Refill   atorvastatin (LIPITOR) 20 MG tablet Take 20 mg by mouth daily at 6 PM.     meloxicam (MOBIC) 15 MG tablet TAKE 1 TABLET BY MOUTH EVERY DAY (Patient taking differently: Take 15 mg by mouth as needed.) 90 tablet 0   Multiple Vitamin  (MULTIVITAMIN) capsule Take by mouth.     tamoxifen (NOLVADEX) 20 MG tablet Take 1 tablet (20 mg total) by mouth daily. 90 tablet 4   triamcinolone (NASACORT ALLERGY 24HR) 55 MCG/ACT AERO nasal inhaler Place 2 sprays into the nose daily. 1 each 12   venlafaxine XR (EFFEXOR-XR) 150 MG 24 hr capsule TAKE 1 CAPSULE BY MOUTH DAILY WITH BREAKFAST. 90 capsule 4   Vitamin D, Ergocalciferol, (DRISDOL) 1.25 MG (50000 UNIT) CAPS capsule Take 50,000 Units by mouth once a week.     zolpidem (AMBIEN) 10 MG tablet Take 10 mg by mouth at bedtime as needed for sleep.     No current facility-administered medications for this visit.    PHYSICAL EXAMINATION: ECOG PERFORMANCE STATUS: 1 - Symptomatic but completely ambulatory  Vitals:   01/27/22 0854  BP: 109/82  Pulse: 73  Resp: 18  Temp: (!) 97.3 F (36.3 C)  SpO2: 100%   Filed Weights   01/27/22 0854  Weight: 172 lb (78 kg)    BREAST: No palpable masses or nodules in either right or left breasts. No palpable axillary supraclavicular or infraclavicular adenopathy no breast tenderness or nipple discharge. (exam performed in the presence of a chaperone)  LABORATORY DATA:  I have reviewed the data as listed    Latest Ref Rng & Units 01/27/2022    8:42 AM 03/25/2021   11:09 AM 08/26/2019   11:05 AM  CMP  Glucose 70 - 99 mg/dL 123  143  60   BUN 8 - 23 mg/dL _0 Creatinine 0.44 - 1.00 mg/dL 0.62  0.73  0.71  Sodium 135 - 145 mmol/L 140  139  142   Potassium 3.5 - 5.1 mmol/L 4.3  4.2  3.8   Chloride 98 - 111 mmol/L 105  106  105   CO2 22 - 32 mmol/L _0 Calcium 8.9 - 10.3 mg/dL 9.2  9.6  9.2   Total Protein 6.5 - 8.1 g/dL 6.6  7.3  6.9   Total Bilirubin 0.3 - 1.2 mg/dL 0.3  <0.2  <0.2   Alkaline Phos 38 - 126 U/L 65  74  55   AST 15 - 41 U/L _1 ALT 0 - 44 U/L _2 Lab Results  Component Value Date   WBC 5.0 01/27/2022   HGB 13.1 01/27/2022   HCT 38.6 01/27/2022   MCV 86.7 01/27/2022   PLT 275  01/27/2022   NEUTROABS 3.0 01/27/2022   ASSESSMENT & PLAN:  Malignant neoplasm of upper-inner quadrant of left breast in female, estrogen receptor positive ([facility]) 07/12/2016: T1BN0 stage Ia grade 1 IDC ER/PR positive HER2 negative Ki-67 2%, biopsy of non-mass enhancement: ALH 07/21/2016: Genetics: Negative 08/11/2016: Left lumpectomy: T1 a N0 stage Ia grade 1 IDC HER2 negative, ER/PR positive 08/23/2016: Bilateral mammoplasty surgery: Benign 10/10/2016-11/24/2016: Adjuvant radiation  Current treatment: Tamoxifen started June 2018  Breast cancer index: No benefit from extending endocrine therapy, distant recurrence: 1.3% Since she completed 5 years of therapy I recommended that she can discontinue tamoxifen at this time.  Effexor: I encouraged her to reduce the dosage to 75 mg daily.  If she wishes to stop it at that time we can decrease it further to 37.5 mg and then gradually discontinue it.  Breast cancer surveillance: 1.  Breast exam 01/27/2022: Benign 2. mammogram 06/25/2021: Benign breast density category C  Return to clinic in 1 year for follow-up  No orders of the defined types were placed in this encounter.  The patient has a good understanding of the overall plan. she agrees with it. she will call with any problems that may develop before the next visit here. Total time spent: 30 mins including face to face time and time spent for planning, charting and co-ordination of care   Harriette Ohara, MD 01/27/22

## 2022-01-27 NOTE — Assessment & Plan Note (Signed)
07/12/2016: T1BN0 stage Ia grade 1 IDC ER/PR positive HER2 negative Ki-67 2%, biopsy of non-mass enhancement: ALH 07/21/2016: Genetics: Negative 08/11/2016: Left lumpectomy: T1 a N0 stage Ia grade 1 IDC HER2 negative, ER/PR positive 08/23/2016: Bilateral mammoplasty surgery: Benign 10/10/2016-11/24/2016: Adjuvant radiation  Current treatment: Tamoxifen started June 2018  Breast cancer index: No benefit from extending endocrine therapy, distant recurrence: 1.3% Since she completed 5 years of therapy I recommended that she can discontinue tamoxifen at this time.  Breast cancer surveillance: 1.  Breast exam 01/27/2022: Benign 2. mammogram 06/25/2021: Benign breast density category C  Return to clinic in 1 year for follow-up

## 2022-01-27 NOTE — Progress Notes (Signed)
Patient Care Team: Bernerd Limbo, MD as PCP - General (Family Medicine) Magrinat, Virgie Dad, MD (Inactive) as Consulting Physician (Oncology) Rolm Bookbinder, MD as Consulting Physician (General Surgery) Maisie Fus, MD as Consulting Physician (Obstetrics and Gynecology) Irene Limbo, MD as Consulting Physician (Plastic Surgery) Delice Bison Charlestine Massed, NP as Nurse Practitioner (Hematology and Oncology) Lyndal Pulley, DO as Consulting Physician (Family Medicine)  DIAGNOSIS:  Encounter Diagnosis  Name Primary?   Malignant neoplasm of upper-inner quadrant of left breast in female, estrogen receptor positive ([place])     SUMMARY OF ONCOLOGIC HISTORY: Oncology History  Malignant neoplasm of upper-inner quadrant of left breast in female, estrogen receptor positive ([place])  07/12/2016 Initial Biopsy   Left breast needle core biopsy UIQ: IDC, lobular neoplasia, grade 1, ER+(100%), PR+(100%), Ki-67 2%, HER-2 negative (ratio 1.89).    07/26/2016 Initial Diagnosis   Malignant neoplasm of upper-inner quadrant of left breast in female, estrogen receptor positive ([place])   08/11/2016 Surgery   Left breast lumpectomy: IDC, 0.5cm, grade 1, atypical lobular hyperplasia, margins neg, T1a, N0   10/10/2016 - 11/24/2016 Radiation Therapy   Adjuvant radiation ([place]): Left Breast treated to 50.4 Gy in 28 fractions, and then Boosted an additional 12 Gy in 6 fractions   11/2016 -  Anti-estrogen oral therapy   Tamoxifen daily     CHIEF COMPLIANT: Follow-up on Tamoxifen and establish oncology care with Dr. Marquette Saa  INTERVAL HISTORY: Felicia Hampton is a 66 y.o with the above mention. She presents to the clinic today for a follow-up and establish Oncology care with Dr. Lindi Adie. She states that she has been doing fine. She has been having sinus infections and some fatigue. Overall she has been tolerating the tamoxifen.   ALLERGIES:  is allergic to ace inhibitors, diclofenac, morphine, morphine and related,  and oxycodone.  MEDICATIONS:  Current Outpatient Medications  Medication Sig Dispense Refill   atorvastatin (LIPITOR) 20 MG tablet Take 20 mg by mouth daily at 6 PM.     meloxicam (MOBIC) 15 MG tablet Take 1 tablet (15 mg total) by mouth as needed.     Multiple Vitamin (MULTIVITAMIN) capsule Take by mouth.     triamcinolone (NASACORT ALLERGY 24HR) 55 MCG/ACT AERO nasal inhaler Place 2 sprays into the nose daily. 1 each 12   venlafaxine XR (EFFEXOR-XR) 75 MG 24 hr capsule Take 1 capsule (75 mg total) by mouth daily with breakfast. 90 capsule 1   zolpidem (AMBIEN) 10 MG tablet Take 10 mg by mouth at bedtime as needed for sleep.     No current facility-administered medications for this visit.    PHYSICAL EXAMINATION: ECOG PERFORMANCE STATUS: 1 - Symptomatic but completely ambulatory  Vitals:   01/27/22 0854  BP: 109/82  Pulse: 73  Resp: 18  Temp: (!) 97.3 F (36.3 C)  SpO2: 100%   Filed Weights   01/27/22 0854  Weight: 172 lb (78 kg)    BREAST: No palpable masses or nodules in either right or left breasts. No palpable axillary supraclavicular or infraclavicular adenopathy no breast tenderness or nipple discharge. (exam performed in the presence of a chaperone)  LABORATORY DATA:  I have reviewed the data as listed    Latest Ref Rng & Units 01/27/2022    8:42 AM 03/25/2021   11:09 AM 08/26/2019   11:05 AM  CMP  Glucose 70 - 99 mg/dL 123  143  60   BUN 8 - 23 mg/dL 14  11  14    Creatinine 0.44 -  1.00 mg/dL 0.62  0.73  0.71   Sodium 135 - 145 mmol/L 140  139  142   Potassium 3.5 - 5.1 mmol/L 4.3  4.2  3.8   Chloride 98 - 111 mmol/L 105  106  105   CO2 22 - 32 mmol/L 30  23  29    Calcium 8.9 - 10.3 mg/dL 9.2  9.6  9.2   Total Protein 6.5 - 8.1 g/dL 6.6  7.3  6.9   Total Bilirubin 0.3 - 1.2 mg/dL 0.3  <0.2  <0.2   Alkaline Phos 38 - 126 U/L 65  74  55   AST 15 - 41 U/L 26  21  19    ALT 0 - 44 U/L 25  22  21      Lab Results  Component Value Date   WBC 5.0 01/27/2022    HGB 13.1 01/27/2022   HCT 38.6 01/27/2022   MCV 86.7 01/27/2022   PLT 275 01/27/2022   NEUTROABS 3.0 01/27/2022    ASSESSMENT & PLAN:  Malignant neoplasm of upper-inner quadrant of left breast in female, estrogen receptor positive ([place]) 07/12/2016: T1BN0 stage Ia grade 1 IDC ER/PR positive HER2 negative Ki-67 2%, biopsy of non-mass enhancement: ALH 07/21/2016: Genetics: Negative 08/11/2016: Left lumpectomy: T1 a N0 stage Ia grade 1 IDC HER2 negative, ER/PR positive 08/23/2016: Bilateral mammoplasty surgery: Benign 10/10/2016-11/24/2016: Adjuvant radiation  Current treatment: Tamoxifen started June 2018  Breast cancer index: No benefit from extending endocrine therapy, distant recurrence: 1.3% Since she completed 5 years of therapy I recommended that she can discontinue tamoxifen at this time.  Breast cancer surveillance: 1.  Breast exam 01/27/2022: Benign 2. 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 She is planning to go to [place] which is her home country after 12 years and is excited about that.  Return to clinic in 1 year for follow-up    No orders of the defined types were placed in this encounter.  The patient has a good understanding of the overall plan. she agrees with it. she will call with any problems that may develop before the next visit here. Total time spent: 30 mins including face to face time and time spent for planning, charting and co-ordination of care   Harriette Ohara, MD 01/27/22    I Gardiner Coins am scribing for Dr. Lindi Adie  I have reviewed the above documentation for accuracy and completeness, and I agree with the above.

## 2022-03-08 ENCOUNTER — Telehealth: Payer: Self-pay | Admitting: Neurology

## 2022-03-08 NOTE — Telephone Encounter (Signed)
LVM and sent MyChart msg informing pt of r/s needed for 12/20 appt- MD out.

## 2022-05-27 ENCOUNTER — Other Ambulatory Visit: Payer: Self-pay | Admitting: Family Medicine

## 2022-05-27 DIAGNOSIS — Z1231 Encounter for screening mammogram for malignant neoplasm of breast: Secondary | ICD-10-CM

## 2022-06-23 ENCOUNTER — Ambulatory Visit: Payer: Self-pay

## 2022-06-23 ENCOUNTER — Ambulatory Visit (INDEPENDENT_AMBULATORY_CARE_PROVIDER_SITE_OTHER): Payer: Medicare HMO | Admitting: Family Medicine

## 2022-06-23 VITALS — BP 122/80 | Ht 62.0 in | Wt 180.0 lb

## 2022-06-23 DIAGNOSIS — M1812 Unilateral primary osteoarthritis of first carpometacarpal joint, left hand: Secondary | ICD-10-CM | POA: Diagnosis not present

## 2022-06-23 DIAGNOSIS — M79642 Pain in left hand: Secondary | ICD-10-CM

## 2022-06-23 DIAGNOSIS — M65332 Trigger finger, left middle finger: Secondary | ICD-10-CM

## 2022-06-23 MED ORDER — MELOXICAM 15 MG PO TABS: 15.0000 mg | ORAL_TABLET | ORAL | Status: DC | PRN

## 2022-06-23 NOTE — Assessment & Plan Note (Signed)
Repeat injection given again.  Tolerated procedure well, seems to be once a year what is necessary.  Follow-up with me again if worsening

## 2022-06-23 NOTE — Patient Instructions (Addendum)
Good to see you  Two injections in left hand today  Meloxicam refilled Follow up when you need me

## 2022-06-23 NOTE — Progress Notes (Addendum)
[place] Phone: [phone] Subjective:    I'm seeing this patient by the request  of:  Bernerd Limbo, MD  CC: Left hand pain  [id number]  Felicia Hampton is a 66 y.o. female coming in with complaint of L hand CMC jt and trigger finger. Patient states that the left hand and finger/thumb is getting stuck especially when she is trying to do her baking. Right hand has also started to give her some problems. Been taking Meloxicam for the last two weeks.       Past Medical History:  Diagnosis Date   Adult ADHD    Breast cancer ([place]) 2018   Left Breast Cancer   Cancer ([place]) 2018   left breast   Family history of breast cancer    Family history of colon cancer    Family history of ovarian cancer    Genetic testing 08/02/2016   Negative; No pathogenic mutations detected. STAT Breast panel with reflex to Common Cancers panel  Genes tested were the 43 genes on [facility]'s Common Cancers panel (APC, ATM, AXIN2, BARD1, BMPR1A, BRCA1, BRCA2, BRIP1, CDH1, CDKN2A, CHEK2, DICER1, EPCAM, GREM1, HOXB13, KIT, MEN1, MLH1, MSH2, MSH6, MUTYH, NBN, NF1, PALB2, PDGFRA, PMS2, POLD1, POLE, PTEN, RAD50, RAD51C, RAD51D, SDHA, SDHB, SDHC, SDHD   History of radiation therapy 10/10/16-11/24/16   left breast 50.4 Gy in 28 fractions, left breast boost 12 Gy in 6 fractions   Hypercholesteremia    Personal history of radiation therapy 2018   Left Breast Cancer   PONV (postoperative nausea and vomiting)    Past Surgical History:  Procedure Laterality Date   ABDOMINAL HYSTERECTOMY     BREAST LUMPECTOMY Left 08/11/2016   BREAST REDUCTION SURGERY Bilateral 08/23/2016   Procedure: BILATERAL ONCOPLASTIC BREAST REDUCTION;  Surgeon: Irene Limbo, MD;  Location: [facility];  Service: Plastics;  Laterality: Bilateral;   KNEE ARTHROSCOPY     x3   RADIOACTIVE SEED GUIDED PARTIAL MASTECTOMY WITH AXILLARY SENTINEL LYMPH NODE BIOPSY Left  08/11/2016   Procedure: RADIOACTIVE SEED GUIDED LEFT BREAST LUMPECTOMY WITH AXILLARY SENTINEL LYMPH NODE BIOPSY;  Surgeon: Rolm Bookbinder, MD;  Location: [facility];  Service: General;  Laterality: Left;   REDUCTION MAMMAPLASTY     SHOULDER ARTHROSCOPY W/ ROTATOR CUFF REPAIR Right    Social History   Socioeconomic History   Marital status: Single    Spouse name: Not on file   Number of children: 1   Years of education: Not on file   Highest education level: Not on file  Occupational History   Occupation: RN  Tobacco Use   Smoking status: Former    Packs/day: 0.50    Years: 10.00    Total pack years: 5.00    Types: Cigarettes    Quit date: 08/02/1988    Years since quitting: 33.9   Smokeless tobacco: Never  Substance and Sexual Activity   Alcohol use: Yes    Comment: socially   Drug use: No   Sexual activity: Not on file  Other Topics Concern   Not on file  Social History Narrative   Left handed   1 cup coffee every morning   Social Determinants of Health   Financial Resource Strain: Not on file  Food Insecurity: Not on file  Transportation Needs: Not on file  Physical Activity: Not on file  Stress: Not on file  Social Connections: Not on file   Allergies  Allergen Reactions  Ace Inhibitors Palpitations and Shortness Of Breath    COX-2 inhibitors   Diclofenac     Other reaction(s): Hypotension (ALLERGY/intolerance)   Morphine Nausea And Vomiting   Morphine And Related Nausea And Vomiting   Oxycodone Other (See Comments)    hallucinations   Family History  Problem Relation Age of Onset   Colon cancer Sister 14       deceased 25   Melanoma Brother        on back; currently 20   Ovarian cancer Maternal Aunt    Colon cancer Paternal Grandmother 96       deceased   Breast cancer Cousin 25       mat female cousin; daughter of unaffected mat aunt; deceased 60   Prostate cancer Father        deceased 10     Current Outpatient Medications  (Cardiovascular):    atorvastatin (LIPITOR) 20 MG tablet, Take 20 mg by mouth daily at 6 PM.  Current Outpatient Medications (Respiratory):    triamcinolone (NASACORT ALLERGY 24HR) 55 MCG/ACT AERO nasal inhaler, Place 2 sprays into the nose daily.  Current Outpatient Medications (Analgesics):    meloxicam (MOBIC) 15 MG tablet, Take 1 tablet (15 mg total) by mouth as needed.   Current Outpatient Medications (Other):    Multiple Vitamin (MULTIVITAMIN) capsule, Take by mouth.   venlafaxine XR (EFFEXOR-XR) 75 MG 24 hr capsule, Take 1 capsule (75 mg total) by mouth daily with breakfast.   zolpidem (AMBIEN) 10 MG tablet, Take 10 mg by mouth at bedtime as needed for sleep.   Reviewed prior external information including notes and imaging from  primary care provider As well as notes that were available from care everywhere and other healthcare systems.  Past medical history, social, surgical and family history all reviewed in electronic medical record.  No pertanent information unless stated regarding to the chief complaint.   Review of Systems:  No headache, visual changes, nausea, vomiting, diarrhea, constipation, dizziness, abdominal pain, skin rash, fevers, chills, night sweats, weight loss, swollen lymph nodes, body aches, joint swelling, chest pain, shortness of breath, mood changes. POSITIVE muscle aches  Objective  Blood pressure 122/80, height _0  (1.575 m), weight 180 lb (81.6 kg).   General: No apparent distress alert and oriented x3 mood and affect normal, dressed appropriately.  HEENT: Pupils equal, extraocular movements intact  Respiratory: Patient's speak in full sentences and does not appear short of breath  Cardiovascular: No lower extremity edema, non tender, no erythema  Left hand exam shows the patient does have some CMC arthritis still noted.  Small effusion noted of the joint.  Positive grind test noted.  Patient does have a trigger nodule noted at the A2 pulley on the  middle finger.  Procedure: Real-time Ultrasound Guided Injection of right CMC joint Device: GE Logiq Q7 Ultrasound guided injection is preferred based studies that show increased duration, increased effect, greater accuracy, decreased procedural pain, increased response rate, and decreased cost with ultrasound guided versus blind injection.  Verbal informed consent obtained.  Time-out conducted.  Noted no overlying erythema, induration, or other signs of local infection.  Skin prepped in a sterile fashion.  Local anesthesia: Topical Ethyl chloride.  With sterile technique and under real time ultrasound guidance: With a 25-gauge half inch needle injected with 0.5 cc of 0.5% Marcaine and 0.5 cc of Kenalog 40 mg/mL Completed without difficulty  Pain immediately resolved suggesting accurate placement of the medication.  Advised to call if fevers/chills,  erythema, induration, drainage, or persistent bleeding.  Impression: Technically successful ultrasound guided injection.  Procedure: Real-time Ultrasound Guided Injection of flexor tendon sheath left third finger Device: GE Logiq Q7 Ultrasound guided injection is preferred based studies that show increased duration, increased effect, greater accuracy, decreased procedural pain, increased response rate, and decreased cost with ultrasound guided versus blind injection.  Verbal informed consent obtained.  Time-out conducted.  Noted no overlying erythema, induration, or other signs of local infection.  Skin prepped in a sterile fashion.  Local anesthesia: Topical Ethyl chloride.  With sterile technique and under real time ultrasound guidance: With a 25-gauge half inch needle injecting 0.5 cc of 0.5% Marcaine and 0.5 cc of Kenalog 40 mg/mL Completed without difficulty  Pain immediately resolved suggesting accurate placement of the medication.  Advised to call if fevers/chills, erythema, induration, drainage, or persistent bleeding.  Impression:  Technically successful ultrasound guided injection.    Impression and Recommendations:     The above documentation has been reviewed and is accurate and complete Lyndal Pulley, DO

## 2022-06-23 NOTE — Assessment & Plan Note (Signed)
Repeat injection given again today.  Tolerated the procedure well, chronic problem with worsening symptoms.  Discussed bracing at night, home exercises, which activities to do and which ones to avoid.  Follow-up with me again in 6 to 8 weeks.

## 2022-06-29 ENCOUNTER — Ambulatory Visit: Payer: Medicare HMO | Admitting: Neurology

## 2022-07-08 NOTE — Progress Notes (Deleted)
Felicia Hampton Phone: [phone] Subjective:    I'm seeing this patient by the request  of:  Bernerd Limbo, MD  CC:   [id number]  06/23/2022 Repeat injection given again today.  Tolerated the procedure well, chronic problem with worsening symptoms.  Discussed bracing at night, home exercises, which activities to do and which ones to avoid.  Follow-up with me again in 6 to 8 weeks.     Update 07/14/2022 Felicia Hampton is a 66 y.o. female coming in with complaint of L hand middle finger, trigger finger. Patient states        Past Medical History:  Diagnosis Date   Adult ADHD    Breast cancer ([place]) 2018   Left Breast Cancer   Cancer ([place]) 2018   left breast   Family history of breast cancer    Family history of colon cancer    Family history of ovarian cancer    Genetic testing 08/02/2016   Negative; No pathogenic mutations detected. STAT Breast panel with reflex to Common Cancers panel  Genes tested were the 43 genes on [facility]'s Common Cancers panel (APC, ATM, AXIN2, BARD1, BMPR1A, BRCA1, BRCA2, BRIP1, CDH1, CDKN2A, CHEK2, DICER1, EPCAM, GREM1, HOXB13, KIT, MEN1, MLH1, MSH2, MSH6, MUTYH, NBN, NF1, PALB2, PDGFRA, PMS2, POLD1, POLE, PTEN, RAD50, RAD51C, RAD51D, SDHA, SDHB, SDHC, SDHD   History of radiation therapy 10/10/16-11/24/16   left breast 50.4 Gy in 28 fractions, left breast boost 12 Gy in 6 fractions   Hypercholesteremia    Personal history of radiation therapy 2018   Left Breast Cancer   PONV (postoperative nausea and vomiting)    Past Surgical History:  Procedure Laterality Date   ABDOMINAL HYSTERECTOMY     BREAST LUMPECTOMY Left 08/11/2016   BREAST REDUCTION SURGERY Bilateral 08/23/2016   Procedure: BILATERAL ONCOPLASTIC BREAST REDUCTION;  Surgeon: Irene Limbo, MD;  Location: [facility];  Service: Plastics;  Laterality: Bilateral;   KNEE ARTHROSCOPY     x3   RADIOACTIVE SEED  GUIDED PARTIAL MASTECTOMY WITH AXILLARY SENTINEL LYMPH NODE BIOPSY Left 08/11/2016   Procedure: RADIOACTIVE SEED GUIDED LEFT BREAST LUMPECTOMY WITH AXILLARY SENTINEL LYMPH NODE BIOPSY;  Surgeon: Rolm Bookbinder, MD;  Location: [facility];  Service: General;  Laterality: Left;   REDUCTION MAMMAPLASTY     SHOULDER ARTHROSCOPY W/ ROTATOR CUFF REPAIR Right    Social History   Socioeconomic History   Marital status: Single    Spouse name: Not on file   Number of children: 1   Years of education: Not on file   Highest education level: Not on file  Occupational History   Occupation: RN  Tobacco Use   Smoking status: Former    Packs/day: 0.50    Years: 10.00    Total pack years: 5.00    Types: Cigarettes    Quit date: 08/02/1988    Years since quitting: 33.9   Smokeless tobacco: Never  Substance and Sexual Activity   Alcohol use: Yes    Comment: socially   Drug use: No   Sexual activity: Not on file  Other Topics Concern   Not on file  Social History Narrative   Left handed   1 cup coffee every morning   Social Determinants of Health   Financial Resource Strain: Not on file  Food Insecurity: Not on file  Transportation Needs: Not on file  Physical Activity: Not on file  Stress: Not on file  Social  Connections: Not on file   Allergies  Allergen Reactions   Ace Inhibitors Palpitations and Shortness Of Breath    COX-2 inhibitors   Diclofenac     Other reaction(s): Hypotension (ALLERGY/intolerance)   Morphine Nausea And Vomiting   Morphine And Related Nausea And Vomiting   Oxycodone Other (See Comments)    hallucinations   Family History  Problem Relation Age of Onset   Colon cancer Sister 8       deceased 86   Melanoma Brother        on back; currently 61   Ovarian cancer Maternal Aunt    Colon cancer Paternal Grandmother 96       deceased   Breast cancer Cousin 63       mat female cousin; daughter of unaffected mat aunt; deceased 24   Prostate  cancer Father        deceased 13     Current Outpatient Medications (Cardiovascular):    atorvastatin (LIPITOR) 20 MG tablet, Take 20 mg by mouth daily at 6 PM.  Current Outpatient Medications (Respiratory):    triamcinolone (NASACORT ALLERGY 24HR) 55 MCG/ACT AERO nasal inhaler, Place 2 sprays into the nose daily.  Current Outpatient Medications (Analgesics):    meloxicam (MOBIC) 15 MG tablet, Take 1 tablet (15 mg total) by mouth as needed.   Current Outpatient Medications (Other):    Multiple Vitamin (MULTIVITAMIN) capsule, Take by mouth.   venlafaxine XR (EFFEXOR-XR) 75 MG 24 hr capsule, Take 1 capsule (75 mg total) by mouth daily with breakfast.   zolpidem (AMBIEN) 10 MG tablet, Take 10 mg by mouth at bedtime as needed for sleep.   Reviewed prior external information including notes and imaging from  primary care provider As well as notes that were available from care everywhere and other healthcare systems.  Past medical history, social, surgical and family history all reviewed in electronic medical record.  No pertanent information unless stated regarding to the chief complaint.   Review of Systems:  No headache, visual changes, nausea, vomiting, diarrhea, constipation, dizziness, abdominal pain, skin rash, fevers, chills, night sweats, weight loss, swollen lymph nodes, body aches, joint swelling, chest pain, shortness of breath, mood changes. POSITIVE muscle aches  Objective  There were no vitals taken for this visit.   General: No apparent distress alert and oriented x3 mood and affect normal, dressed appropriately.  HEENT: Pupils equal, extraocular movements intact  Respiratory: Patient's speak in full sentences and does not appear short of breath  Cardiovascular: No lower extremity edema, non tender, no erythema      Impression and Recommendations:

## 2022-07-14 ENCOUNTER — Ambulatory Visit: Payer: Medicare HMO | Admitting: Family Medicine

## 2022-07-20 ENCOUNTER — Ambulatory Visit
Admission: RE | Admit: 2022-07-20 | Discharge: 2022-07-20 | Disposition: A | Discharge status: Home / Self Care | Payer: Medicare HMO | Source: Ambulatory Visit | Attending: Family Medicine | Admitting: Family Medicine

## 2022-07-20 DIAGNOSIS — Z1231 Encounter for screening mammogram for malignant neoplasm of breast: Secondary | ICD-10-CM

## 2022-08-10 ENCOUNTER — Telehealth: Payer: Self-pay

## 2022-08-10 NOTE — Telephone Encounter (Signed)
Call Patient and LVM to bring her Sim card from her CPAP machine.

## 2022-08-11 ENCOUNTER — Ambulatory Visit: Payer: Medicare HMO | Admitting: Neurology

## 2022-08-11 ENCOUNTER — Encounter: Payer: Self-pay | Admitting: Neurology

## 2022-08-11 VITALS — BP 104/63 | HR 63 | Ht 62.0 in | Wt 185.8 lb

## 2022-08-11 DIAGNOSIS — G4733 Obstructive sleep apnea (adult) (pediatric): Secondary | ICD-10-CM | POA: Diagnosis not present

## 2022-08-11 DIAGNOSIS — N951 Menopausal and female climacteric states: Secondary | ICD-10-CM | POA: Diagnosis not present

## 2022-08-11 DIAGNOSIS — J300 Vasomotor rhinitis: Secondary | ICD-10-CM

## 2022-08-11 NOTE — Patient Instructions (Signed)
1)  CPAP continuation under current settings, but  with a different machine and mask if possible.   This machine is travel unfriendly and loud, bulky.  Please have your DME investigate a refurbished [employer] machine.  Please provide a nasal pillow.    2) I  usually prefer trazodone as a sleep inducing medication that is not addictive over Ambien but this can interfere with Effexor or other serotonin reuptake inhibitors so we will not change anything here today.  Marland Kitchen

## 2022-08-11 NOTE — Progress Notes (Signed)
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 Felicia Hampton explains that she struggles with her CPAP she is taking the task on every night and has a 90% compliance by days but she can hardly make it to the 4-hour mark she still achieved 80% of compliance by hours.  She feels suffocated it is uncomfortable, she is not liking it at all and the machine is also very noisy it is hard to travel with it is somewhat clumsy model it and I breeze 20 a machine.  The current settings are between 6 and 16 cmH2O for out to titration and she uses 11 cmH2O for 95% of the night.  Airleak is mild to moderate and her residual apneas perfect 2.3 events per hour it tells me that CPAP therapy works numerically but it has not increased her quality of life. She also r travels 8 weeks a year and lives part time in [place] and the current machine is a hassle.    Felicia Hampton underwent a home sleep test through our lab the study dated already date was 03/22/2019 ; The patient endorsed the patient endorsed the Epworth sleepiness score at 14 out of 24 points, had a BMI of 33, per her home sleep test indicated a total AHI of 24.7 during REM sleep this amounted to 37.1 in supine sleep 47.2/h. So we recommended to treat with and with a auto titration CPAP 6-16 cmH2O to centimeter EPR heated humidification and mask of patient's choice allowing her to sleep on her side.  I have today the pleasure to see her on an auto titration and she has responded very well.  She is 97%  compliant with CPAP use 29 out of 30 days and 27 of those days with over 4 hours of consecutive use average usage per hours is 6.9 hours at night.  She was set up 1 months and 4 days ago.  Today is 37 December.  Her serial number for the iron breeze machine Hgb-to be [id number] settings as quoted above residual AHI is 2.2/h.   The patient was provided a full facemask which is not exactly what I would have in mind however she developed a did not have before nasal congestion nasal congestion and she had 2 bouts of sinusitis she states since she has been on CPAP.  So either the humidity creates CPAP related rhinitis or we are dealing with some kind of allergy.  She does not have a history of allergic rhinitis.  Air should be filtered and not produce allergic rhinitis my goal is to reduce the humidification by 2 scores for the next couple of days and see if that helps to allow a decongestion and if it is the other way around we will try to to put more humidity into it. We can also prep with nasocort or nasonex before bedtime.  01-07-2021: Originally referred from PCP. Her late husband had often mentioned her snoring. Recently she went to a nursing conference and shared a room with colleagues, and she was made aware of her apnea. She has some sleepiness in daytime, when inactive.    I have the pleasure of seeing Felicia Hampton today, a left-handed Caucasian femalen presenting with EDS and diaphoresis , loud snoring, obesity. All were  symptoms that can be attributed to OSA.   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 Her husband died in 10/11/2019.   She has a has a past medical history of Adult ADHD, Breast cancer ([facility]) Oct 10, 2016), Family history of breast cancer,  Family history of colon cancer,  Genetic testing (08/02/2016), History of radiation therapy (10/10/16-11/24/16), Hypercholesteremia, Personal history of radiation therapy 10-10-16), and PONV (postoperative nausea and vomiting).      Sleep  relevant medical history: Family medical /sleep history: no  other family member diagnosed with OSA.  Social history:  Patient is working as an Haematologist- returned to part time.  She lives in a household alone. No pets,  1 biological child.  The patient currently works daytime -Tobacco use: quit 32 years ago, was a 1 pack per week.   ETOH use: socially, Caffeine intake in form of Coffee( 1 cup of coffee ) Soda( /) Tea ( /) or energy drinks. Regular exercise in form of walking.  Hobbies : reading- rarely cooking now, bible study.       Sleep habits are as follows: The patient's dinner time is between 6-7 PM. The patient goes to bed at 10-10.30 PM and continues to sleep for 6 hours, wakes for one bathroom breaks, the first time at 5 AM.   The preferred sleep position is side and prone , with the support of 2 pillows. Sleep number bed- adjustable. Dreams are reportedly rare- and often nightmares.  She often wakes up at an undesired time.  6  AM is the usual rise time. The patient wakes up spontaneously. He/ She reports not feeling refreshed or restored in AM, with symptoms such as dry mouth, morning residual fatigue. Naps are taken frequently, not in bed, lasting from 15 to 30 minutes and are more refreshing than nocturnal sleep.      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  This has given her some hot flashes kind of aggressive menopausal symptoms and these also affect the way she sleeps.  She lost her husband in 2019/10/11 who struggled with Alzheimer's dementia, she has begun to work again at least part-time as an Haematologist and she is socially well connected as her son and 3 young grandchildren are living close by.  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   She is happy with her new CPAP experience but reported naal congestion and sinus congestion-  She is  having much less sleepiness now , Epworth dropped to 8 points/ 24  from pre CPAP at 14 points.    Review of Systems: Out of a complete 14 system review, the patient complains of only the following symptoms, and all other reviewed systems are negative.:  Fatigue, sleepiness , snoring, fragmented sleep, Insomnia - early arousal.  Tamoxifen , effexor for hot flushes.  Breast cancer survivor.    How likely are you to doze in the following situations: 0 =  not likely, 1 = slight chance, 2 = moderate chance, 3 = high chance   Sitting and Reading? Watching Television? Sitting inactive in a public place (theater or meeting)? As a passenger in a car for an hour without a break? Lying down in the afternoon when circumstances permit? Sitting and talking to someone? Sitting quietly after lunch without alcohol? In a car, while stopped for a few minutes in traffic?   Total = 6/ 24  and full time working.  FSS remained at 27/63. GDS : 3/ 15.  Widowed for 2 years.   Was in 2023: 14/ 24 points  with one or two naps a day,    FSS endorsed at 27/ 63 points.  GDS:  8 / 15   Social History   Socioeconomic History   Marital status: Single    Spouse name: Not on file   Number of children: 1   Years of education: Not on file   Highest education level: Not on file  Occupational History   Occupation: RN  Tobacco Use   Smoking status: Former    Packs/day: 0.50    Years: 10.00    Total pack years: 5.00    Types: Cigarettes    Quit date: 08/02/1988    Years since quitting: 34.0   Smokeless tobacco: Never  Substance and Sexual Activity   Alcohol use: Yes    Comment: socially   Drug use: No   Sexual activity: Not on file  Other Topics Concern   Not on file  Social History Narrative   Left handed   1 cup coffee every morning   Social Determinants of Health   Financial Resource Strain: Not on file  Food Insecurity: Not on file  Transportation Needs: Not on file  Physical Activity: Not on  file  Stress: Not on file  Social Connections: Not on file    Family History  Problem Relation Age of Onset   Colon cancer Sister 12       deceased 50   Melanoma Brother        on back; currently 39   Ovarian cancer Maternal Aunt    Colon cancer Paternal Grandmother 96       deceased   Breast cancer Cousin 8       mat female cousin; daughter of unaffected mat aunt; deceased 24   Prostate cancer Father        deceased 33    Past Medical History:  Diagnosis Date   Adult ADHD    Breast cancer ([facility]) 2018   Left Breast Cancer   Cancer ([facility]) 2018   left breast   Family history of breast cancer    Family history of colon cancer    Family history of ovarian cancer    Genetic testing 08/02/2016   Negative; No pathogenic mutations detected. STAT Breast panel with reflex to Common Cancers panel  Genes tested were the 43 genes on [facility]'s Common Cancers panel (APC, ATM, AXIN2, BARD1, BMPR1A, BRCA1, BRCA2, BRIP1, CDH1, CDKN2A, CHEK2, DICER1, EPCAM, GREM1, HOXB13, KIT, MEN1, MLH1, MSH2, MSH6, MUTYH, NBN, NF1, PALB2, PDGFRA, PMS2, POLD1, POLE, PTEN, RAD50, RAD51C, RAD51D, SDHA, SDHB, SDHC, SDHD   History of radiation therapy 10/10/16-11/24/16   left breast 50.4 Gy in 28 fractions, left breast boost 12 Gy in 6 fractions   Hypercholesteremia    Personal history of radiation therapy 2018   Left Breast Cancer   PONV (postoperative nausea and vomiting)     Past Surgical History:  Procedure Laterality Date   ABDOMINAL HYSTERECTOMY     BREAST LUMPECTOMY Left 08/11/2016   BREAST REDUCTION SURGERY Bilateral 08/23/2016   Procedure: BILATERAL ONCOPLASTIC BREAST REDUCTION;  Surgeon: Irene Limbo, MD;  Location: [facility];  Service: Plastics;  Laterality: Bilateral;   KNEE ARTHROSCOPY     x3   RADIOACTIVE SEED GUIDED PARTIAL MASTECTOMY WITH AXILLARY SENTINEL LYMPH NODE BIOPSY Left 08/11/2016   Procedure: RADIOACTIVE SEED GUIDED LEFT BREAST LUMPECTOMY WITH AXILLARY SENTINEL LYMPH  NODE BIOPSY;  Surgeon: Rolm Bookbinder, MD;  Location: [facility];  Service: General;  Laterality: Left;   REDUCTION MAMMAPLASTY     SHOULDER ARTHROSCOPY W/ ROTATOR CUFF REPAIR Right      Current Outpatient Medications on File Prior to Visit  Medication Sig Dispense Refill   atorvastatin (LIPITOR) 20 MG tablet Take 20 mg by mouth daily at 6 PM.     meloxicam (MOBIC) 15 MG tablet Take 1 tablet (15 mg total) by mouth as needed.     Multiple Vitamin (MULTIVITAMIN) capsule Take by mouth.     triamcinolone (NASACORT ALLERGY 24HR) 55 MCG/ACT AERO nasal inhaler Place 2 sprays into the nose daily. 1 each 12   venlafaxine XR (EFFEXOR-XR) 75 MG 24 hr capsule Take 1 capsule (75 mg total) by mouth daily with breakfast. 90 capsule 1   zolpidem (AMBIEN) 10 MG tablet Take 10 mg by mouth at bedtime as needed for sleep.     No current facility-administered medications on file prior to visit.    Allergies  Allergen Reactions   Ace Inhibitors Palpitations and Shortness Of Breath    COX-2 inhibitors   Diclofenac     Other reaction(s): Hypotension (ALLERGY/intolerance)   Morphine Nausea And Vomiting   Morphine And Related Nausea And Vomiting   Oxycodone Other (See Comments)    hallucinations    Physical exam:  Today's Vitals   08/11/22 0935  BP: 104/63  Pulse: 63  Weight: 185 lb 12.8 oz (84.3 kg)  Height: '5\' 2"'$  (1.575 m)   Body mass index is 33.98 kg/m.   Wt Readings from Last 3 Encounters:  08/11/22 185 lb 12.8 oz (84.3 kg)  06/23/22 180 lb (81.6 kg)  01/27/22 172 lb (78 kg)     Ht Readings from Last 3 Encounters:  08/11/22 '5\' 2"'$  (1.575 m)  06/23/22 '5\' 2"'$  (1.575 m)  01/27/22 '5\' 2"'$  (1.575 m)      General: The patient is awake, alert and appears not in acute distress.  The patient is well groomed. Head: Normocephalic, atraumatic. Neck is supple.  Mallampati 1,  neck circumference:16 inches. Nasal airflow patent.  Trunk: The patient's posture is erect.    Neurologic exam : The patient is awake and alert, oriented to place and time.   Memory subjective described as intact.  Attention span & concentration ability appears normal.  Speech is fluent,  without  dysarthria, dysphonia or aphasia.  Mood and affect are appropriate.   Cranial nerves: no loss of smell or taste reported  Pupils are equal and briskly reactive to light. Funduscopic exam deferred. .  Extraocular movements in vertical and horizontal planes were intact and without nystagmus. No Diplopia. Visual fields by finger perimetry are intact. Hearing was intact to soft voice and finger rubbing.    Facial sensation intact to fine touch.  Facial motor strength is symmetric and tongue and uvula move midline.  Neck ROM : rotation, tilt and flexion extension were normal for age and shoulder shrug was  symmetrical.    Motor exam:  Symmetric bulk, tone and ROM.   Normal tone without cog -wheeling, right sided reduced grip strength .   After spending a total time of 25 minutes face to face and additional time for physical and neurologic examination, review of laboratory studies,  personal review of imaging studies, reports and results of other testing and review of referral information / records as far as provided in visit, I have established the following assessments:     My Plan is to proceed with:  1)  CPAP continuation under current settings, but  with a different machine and mask if possible. This machine is travel unfriendly and loud, bulky.  Please provide a nasal pillow.   2) I  usually prefer trazodone as a sleep inducing medication that is not addictive over Ambien but this can interfere with   Effexor or other serotonin reuptake inhibitors so we will not change anything here today.  3) nasal mask or pillow in the future.    I would like to thank Bernerd Limbo, MD and or allowing me to meet with and to take care of this pleasant patient.    I plan to follow up either  personally or through our NP within 6 months.   CC: I will share my notes with PCP, Dr Coletta Memos.  Electronically signed by: Larey Seat, MD 08/11/2022 10:11 AM  [facility] Board certified by The [employer] of Sleep Medicine and Diplomate of the [employer] of Sleep Medicine. Board certified In Neurology through the [place], Fellow of the [employer] of Neurology. Medical Director of [employer].

## 2022-08-15 ENCOUNTER — Telehealth: Payer: Self-pay

## 2022-08-19 ENCOUNTER — Other Ambulatory Visit: Payer: Self-pay | Admitting: Hematology and Oncology

## 2023-01-27 ENCOUNTER — Other Ambulatory Visit: Payer: Self-pay

## 2023-01-27 DIAGNOSIS — Z170 Estrogen receptor positive status [ER+]: Secondary | ICD-10-CM

## 2023-01-29 NOTE — Progress Notes (Signed)
Patient Care Team: Tracey Harries, MD as PCP - General (Family Medicine) Magrinat, Valentino Hue, MD (Inactive) as Consulting Physician (Oncology) Emelia Loron, MD as Consulting Physician (General Surgery) Freddy Finner, MD (Inactive) as Consulting Physician (Obstetrics and Gynecology) Glenna Fellows, MD as Consulting Physician (Plastic Surgery) Axel Filler Larna Daughters, NP as Nurse Practitioner (Hematology and Oncology) Judi Saa, DO as Consulting Physician (Family Medicine)  DIAGNOSIS: No diagnosis found.  SUMMARY OF ONCOLOGIC HISTORY: Oncology History  Malignant neoplasm of upper-inner quadrant of left breast in female, estrogen receptor positive (HCC)  07/12/2016 Initial Biopsy   Left breast needle core biopsy UIQ: IDC, lobular neoplasia, grade 1, ER+(100%), PR+(100%), Ki-67 2%, HER-2 negative (ratio 1.89).    07/26/2016 Initial Diagnosis   Malignant neoplasm of upper-inner quadrant of left breast in female, estrogen receptor positive (HCC)   08/11/2016 Surgery   Left breast lumpectomy: IDC, 0.5cm, grade 1, atypical lobular hyperplasia, margins neg, T1a, N0   10/10/2016 - 11/24/2016 Radiation Therapy   Adjuvant radiation ([facility]): Left Breast treated to 50.4 Gy in 28 fractions, and then Boosted an additional 12 Gy in 6 fractions   11/2016 -  Anti-estrogen oral therapy   Tamoxifen daily     CHIEF COMPLIANT: Follow-up on Tamoxifen   INTERVAL HISTORY: Felicia Hampton is a 67 y.o with the above mentioned. She presents to the clinic today for a follow-up.    ALLERGIES:  is allergic to ace inhibitors, diclofenac, morphine, morphine and codeine, and oxycodone.  MEDICATIONS:  Current Outpatient Medications  Medication Sig Dispense Refill   atorvastatin (LIPITOR) 20 MG tablet Take 20 mg by mouth daily at 6 PM.     meloxicam (MOBIC) 15 MG tablet Take 1 tablet (15 mg total) by mouth as needed.     Multiple Vitamin (MULTIVITAMIN) capsule Take by mouth.     triamcinolone  (NASACORT ALLERGY 24HR) 55 MCG/ACT AERO nasal inhaler Place 2 sprays into the nose daily. 1 each 12   venlafaxine XR (EFFEXOR-XR) 75 MG 24 hr capsule TAKE 1 CAPSULE BY MOUTH DAILY WITH BREAKFAST. 90 capsule 1   zolpidem (AMBIEN) 10 MG tablet Take 10 mg by mouth at bedtime as needed for sleep.     No current facility-administered medications for this visit.    PHYSICAL EXAMINATION: ECOG PERFORMANCE STATUS: {CHL ONC ECOG PS:[phone]}  There were no vitals filed for this visit. There were no vitals filed for this visit.  BREAST:*** No palpable masses or nodules in either right or left breasts. No palpable axillary supraclavicular or infraclavicular adenopathy no breast tenderness or nipple discharge. (exam performed in the presence of a chaperone)  LABORATORY DATA:  I have reviewed the data as listed    Latest Ref Rng & Units 01/27/2022    8:42 AM 03/25/2021   11:09 AM 08/26/2019   11:05 AM  CMP  Glucose 70 - 99 mg/dL 119  147  60   BUN 8 - 23 mg/dL 14  11  14    Creatinine 0.44 - 1.00 mg/dL 8.29  5.62  1.30   Sodium 135 - 145 mmol/L 140  139  142   Potassium 3.5 - 5.1 mmol/L 4.3  4.2  3.8   Chloride 98 - 111 mmol/L 105  106  105   CO2 22 - 32 mmol/L 30  23  29    Calcium 8.9 - 10.3 mg/dL 9.2  9.6  9.2   Total Protein 6.5 - 8.1 g/dL 6.6  7.3  6.9   Total Bilirubin 0.3 -  1.2 mg/dL 0.3  <1.6  <1.0   Alkaline Phos 38 - 126 U/L 65  74  55   AST 15 - 41 U/L 26  21  19    ALT 0 - 44 U/L 25  22  21      Lab Results  Component Value Date   WBC 5.0 01/27/2022   HGB 13.1 01/27/2022   HCT 38.6 01/27/2022   MCV 86.7 01/27/2022   PLT 275 01/27/2022   NEUTROABS 3.0 01/27/2022    ASSESSMENT & PLAN:  No problem-specific Assessment & Plan notes found for this encounter.    No orders of the defined types were placed in this encounter.  The patient has a good understanding of the overall plan. she agrees with it. she will call with any problems that may develop before the next visit  here. Total time spent: 30 mins including face to face time and time spent for planning, charting and co-ordination of care   Sherlyn Lick, CMA 01/29/23    I Janan Ridge am acting as a Neurosurgeon for [employer]  ***

## 2023-01-30 ENCOUNTER — Inpatient Hospital Stay: Payer: Medicare HMO | Admitting: Hematology and Oncology

## 2023-01-30 ENCOUNTER — Inpatient Hospital Stay: Payer: Medicare HMO | Attending: Hematology and Oncology

## 2023-01-30 ENCOUNTER — Other Ambulatory Visit: Payer: Self-pay

## 2023-01-30 VITALS — BP 121/75 | HR 94 | Temp 97.2°F | Resp 18 | Ht 62.0 in | Wt 195.3 lb

## 2023-01-30 DIAGNOSIS — Z170 Estrogen receptor positive status [ER+]: Secondary | ICD-10-CM

## 2023-01-30 DIAGNOSIS — C50212 Malignant neoplasm of upper-inner quadrant of left female breast: Secondary | ICD-10-CM

## 2023-01-30 DIAGNOSIS — Z79810 Long term (current) use of selective estrogen receptor modulators (SERMs): Secondary | ICD-10-CM | POA: Diagnosis not present

## 2023-01-30 LAB — CBC WITH DIFFERENTIAL (CANCER CENTER ONLY)
Abs Immature Granulocytes: 0.01 10*3/uL (ref 0.00–0.07)
Basophils Absolute: 0 10*3/uL (ref 0.0–0.1)
Basophils Relative: 1 %
Eosinophils Absolute: 0.1 10*3/uL (ref 0.0–0.5)
Eosinophils Relative: 3 %
HCT: 40.1 % (ref 36.0–46.0)
Hemoglobin: 13.8 g/dL (ref 12.0–15.0)
Immature Granulocytes: 0 %
Lymphocytes Relative: 31 %
Lymphs Abs: 1.3 10*3/uL (ref 0.7–4.0)
MCH: 29 pg (ref 26.0–34.0)
MCHC: 34.4 g/dL (ref 30.0–36.0)
MCV: 84.2 fL (ref 80.0–100.0)
Monocytes Absolute: 0.5 10*3/uL (ref 0.1–1.0)
Monocytes Relative: 11 %
Neutro Abs: 2.3 10*3/uL (ref 1.7–7.7)
Neutrophils Relative %: 54 %
Platelet Count: 349 10*3/uL (ref 150–400)
RBC: 4.76 MIL/uL (ref 3.87–5.11)
RDW: 12.7 % (ref 11.5–15.5)
WBC Count: 4.3 10*3/uL (ref 4.0–10.5)
nRBC: 0 % (ref 0.0–0.2)

## 2023-01-30 LAB — CMP (CANCER CENTER ONLY)
ALT: 31 U/L (ref 0–44)
AST: 26 U/L (ref 15–41)
Albumin: 4.1 g/dL (ref 3.5–5.0)
Alkaline Phosphatase: 86 U/L (ref 38–126)
Anion gap: 6 (ref 5–15)
BUN: 14 mg/dL (ref 8–23)
CO2: 29 mmol/L (ref 22–32)
Calcium: 9.7 mg/dL (ref 8.9–10.3)
Chloride: 104 mmol/L (ref 98–111)
Creatinine: 0.64 mg/dL (ref 0.44–1.00)
GFR, Estimated: >60 mL/min (ref >60)
Glucose, Bld: 82 mg/dL (ref 70–99)
Potassium: 4.2 mmol/L (ref 3.5–5.1)
Sodium: 139 mmol/L (ref 135–145)
Total Bilirubin: 0.4 mg/dL (ref 0.3–1.2)
Total Protein: 6.9 g/dL (ref 6.5–8.1)

## 2023-01-30 NOTE — Assessment & Plan Note (Addendum)
07/12/2016: T1BN0 stage Ia grade 1 IDC ER/PR positive HER2 negative Ki-67 2%, biopsy of non-mass enhancement: ALH 07/21/2016: Genetics: Negative 08/11/2016: Left lumpectomy: T1 a N0 stage Ia grade 1 IDC HER2 negative, ER/PR positive 08/23/2016: Bilateral mammoplasty surgery: Benign 10/10/2016-11/24/2016: Adjuvant radiation   Current treatment: Tamoxifen started June 2018  Breast cancer index: No benefit from extending endocrine therapy, distant recurrence: 1.3% Since she completed 5 years of therapy I recommended that she can discontinue tamoxifen at this time.   Breast cancer surveillance: 1.  Breast exam 01/30/2023: Benign 2. 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 She is originally from [place] and is looking to take her family to [place] next year. Return to clinic on an as-needed basis.

## 2023-02-08 ENCOUNTER — Telehealth: Payer: Self-pay

## 2023-02-08 NOTE — Telephone Encounter (Signed)
Called patient and LVM to bring her CPAP machine with her power cord. Called patient son and inform him to mention to his mother to bring her CPAP machine with power cord. Pt verbalized understanding. Pt had no questions at this time but was encouraged to call back if questions arise.

## 2023-02-08 NOTE — Progress Notes (Signed)
[facility] [address] [place]. [place] [address] [phone]       OFFICE FOLLOW UP NOTE  Ms. Felicia Hampton Date of Birth:  05-15-1956 Medical Record Number:  [id number]   Reason for visit:CPAP follow-up    SUBJECTIVE:   CHIEF COMPLAINT:  Chief Complaint  Patient presents with   Follow-up    Rm 3, alone, sleep f/u, sleeps decent, ESS 10, FSS 20   Follow-up visit:  Prior visit: 08/11/2022 with Dr. Vickey Huger  Brief HPI:   Felicia Hampton is a 67 y.o. female who was initially seen by Dr. Vickey Huger on 01/07/2021 with concerns of underlying sleep apnea with symptoms of snoring and some daytime fatigue.  Completed HST 03/16/2021 which showed moderately severe sleep apnea with total AHI 24.7/h with REM accentuation at 37.1/h and significant exacerbation during supine sleep at 47.2/h.  AutoPap initiated.  At prior visit, reported difficulty tolerating as she feels suffocated as well as concerns regarding actual CPAP machine.  Order placed for new interface and requested refurbished machine.   Interval history:  Compliance report over the past 30 days shows 30 out of 30 usage days with 26 days greater than 4 hours for 87% compliance.  Residual AHI 2.0.  Pressure in the 95th percentile 9.3 on pressure setting of 6-16 and iPR level 1.  Leaks in the 95th percentile 9.0.  Currently using iBreeze 20A machine, new machine set up date 12/23/2022.   Reports overall doing well, currently using FFM, tolerating well. Did like nasal mask but unable to use as she is a mouth breather. Has not tried chin strap.  No questions or concerns today.  ESS 10/24 (prior 6/24, prior to CPAP 14/24) FSS 20/63 (prior 27/63, prior to CPAP 27/63)       ROS:   14 system review of systems performed and negative with exception of those listed in HPI  PMH:  Past Medical History:  Diagnosis Date   Adult ADHD    Breast cancer (HCC) 2018   Left Breast Cancer   Cancer (HCC) 2018   left breast    Family history of breast cancer    Family history of colon cancer    Family history of ovarian cancer    Genetic testing 08/02/2016   Negative; No pathogenic mutations detected. STAT Breast panel with reflex to Common Cancers panel  Genes tested were the 43 genes on [employer]'s Common Cancers panel (APC, ATM, AXIN2, BARD1, BMPR1A, BRCA1, BRCA2, BRIP1, CDH1, CDKN2A, CHEK2, DICER1, EPCAM, GREM1, HOXB13, KIT, MEN1, MLH1, MSH2, MSH6, MUTYH, NBN, NF1, PALB2, PDGFRA, PMS2, POLD1, POLE, PTEN, RAD50, RAD51C, RAD51D, SDHA, SDHB, SDHC, SDHD   History of radiation therapy 10/10/16-11/24/16   left breast 50.4 Gy in 28 fractions, left breast boost 12 Gy in 6 fractions   Hypercholesteremia    Melanoma (HCC)    left arm   Personal history of radiation therapy 2018   Left Breast Cancer   PONV (postoperative nausea and vomiting)     PSH:  Past Surgical History:  Procedure Laterality Date   ABDOMINAL HYSTERECTOMY     BREAST LUMPECTOMY Left 08/11/2016   BREAST REDUCTION SURGERY Bilateral 08/23/2016   Procedure: BILATERAL ONCOPLASTIC BREAST REDUCTION;  Surgeon: Glenna Fellows, MD;  Location: [facility];  Service: Plastics;  Laterality: Bilateral;   KNEE ARTHROSCOPY     x3   RADIOACTIVE SEED GUIDED PARTIAL MASTECTOMY WITH AXILLARY SENTINEL LYMPH NODE BIOPSY Left 08/11/2016   Procedure: RADIOACTIVE SEED GUIDED LEFT BREAST LUMPECTOMY WITH  AXILLARY SENTINEL LYMPH NODE BIOPSY;  Surgeon: Emelia Loron, MD;  Location: [facility];  Service: General;  Laterality: Left;   REDUCTION MAMMAPLASTY     SHOULDER ARTHROSCOPY W/ ROTATOR CUFF REPAIR Right     Social History:  Social History   Socioeconomic History   Marital status: Single    Spouse name: Not on file   Number of children: 1   Years of education: Not on file   Highest education level: Not on file  Occupational History   Occupation: RN  Tobacco Use   Smoking status: Former    Current packs/day: 0.00    Average  packs/day: 0.5 packs/day for 10.0 years (5.0 ttl pk-yrs)    Types: Cigarettes    Start date: 08/02/1978    Quit date: 08/02/1988    Years since quitting: 34.5   Smokeless tobacco: Never  Substance and Sexual Activity   Alcohol use: Yes    Comment: socially   Drug use: No   Sexual activity: Not on file  Other Topics Concern   Not on file  Social History Narrative   Left handed   1 cup coffee every morning   Social Determinants of Health   Financial Resource Strain: Low Risk  (01/16/2023)   Received from [facility]   Overall Financial Resource Strain (CARDIA)    Difficulty of Paying Living Expenses: Not hard at all  Food Insecurity: No Food Insecurity (01/16/2023)   Received from [facility]   Hunger Vital Sign    Worried About Running Out of Food in the Last Year: Never true    Ran Out of Food in the Last Year: Never true  Transportation Needs: No Transportation Needs (01/16/2023)   Received from [facility] - Transportation    Lack of Transportation (Medical): No    Lack of Transportation (Non-Medical): No  Physical Activity: Insufficiently Active (01/16/2023)   Received from [facility]   Exercise Vital Sign    Days of Exercise per Week: 3 days    Minutes of Exercise per Session: 30 min  Stress: No Stress Concern Present (01/16/2023)   Received from [facility] - Occupational Stress Questionnaire    Feeling of Stress : Not at all  Social Connections: Socially Integrated (01/16/2023)   Received from [facility]   Social Network    How would you rate your social network (family, work, friends)?: Good participation with social networks  Intimate Partner Violence: Not At Risk (01/16/2023)   Received from [facility]   HITS    Over the last 12 months how often did your partner physically hurt you?: 1    Over the last 12 months how often did your partner insult you or talk down to you?: 1    Over the last 12 months how  often did your partner threaten you with physical harm?: 1    Over the last 12 months how often did your partner scream or curse at you?: 1    Family History:  Family History  Problem Relation Age of Onset   Colon cancer Sister 23       deceased 73   Melanoma Brother        on back; currently 98   Ovarian cancer Maternal Aunt    Colon cancer Paternal Grandmother 53       deceased   Breast cancer Cousin 72       mat female cousin; daughter of  unaffected mat aunt; deceased 54   Prostate cancer Father        deceased 45    Medications:   Current Outpatient Medications on File Prior to Visit  Medication Sig Dispense Refill   atorvastatin (LIPITOR) 20 MG tablet Take 20 mg by mouth daily at 6 PM.     cetirizine (ZYRTEC) 10 MG tablet Take by mouth.     cholecalciferol (VITAMIN D3) 25 MCG (1000 UNIT) tablet Take by mouth.     meloxicam (MOBIC) 15 MG tablet Take 1 tablet (15 mg total) by mouth as needed.     Multiple Vitamin (MULTIVITAMIN) capsule Take by mouth.     triamcinolone (NASACORT ALLERGY 24HR) 55 MCG/ACT AERO nasal inhaler Place 2 sprays into the nose daily. 1 each 12   venlafaxine XR (EFFEXOR-XR) 75 MG 24 hr capsule TAKE 1 CAPSULE BY MOUTH DAILY WITH BREAKFAST. (Patient taking differently: Take 37.5 mg by mouth daily with breakfast. 37.5 mg) 90 capsule 1   Vitamin D, Ergocalciferol, (DRISDOL) 1.25 MG (50000 UNIT) CAPS capsule TAKE 1 CAPSULE BY MOUTH WEEKLY FOR 48 doses     zolpidem (AMBIEN) 10 MG tablet Take 10 mg by mouth at bedtime as needed for sleep.     No current facility-administered medications on file prior to visit.    Allergies:   Allergies  Allergen Reactions   Ace Inhibitors Palpitations and Shortness Of Breath    COX-2 inhibitors   Diclofenac     Other reaction(s): Hypotension (ALLERGY/intolerance)   Morphine Nausea And Vomiting   Morphine And Codeine Nausea And Vomiting   Oxycodone Other (See Comments)    hallucinations      OBJECTIVE:  Physical  Exam  Vitals:   02/09/23 0736  BP: 115/72  Pulse: 77  Weight: 192 lb (87.1 kg)  Height: 5\' 1"  (1.549 m)   Body mass index is 36.28 kg/m. No results found.   General: well developed, well nourished, very pleasant elderly Caucasian female, seated, in no evident distress Head: head normocephalic and atraumatic.   Neck: supple with no carotid or supraclavicular bruits Cardiovascular: regular rate and rhythm, no murmurs Musculoskeletal: no deformity Skin:  no rash/petichiae Vascular:  Normal pulses all extremities   Neurologic Exam Mental Status: Awake and fully alert. Oriented to place and time. Recent and remote memory intact. Attention span, concentration and fund of knowledge appropriate. Mood and affect appropriate.  Cranial Nerves: Pupils equal, briskly reactive to light. Extraocular movements full without nystagmus. Visual fields full to confrontation. Hearing intact. Facial sensation intact. Face, tongue, palate moves normally and symmetrically.  Motor: Normal bulk and tone. Normal strength in all tested extremity muscles Sensory.: intact to touch , pinprick , position and vibratory sensation.  Coordination: Rapid alternating movements normal in all extremities. Finger-to-nose and heel-to-shin performed accurately bilaterally. Gait and Station: Arises from chair without difficulty. Stance is normal. Gait demonstrates normal stride length and balance without use of AD. Tandem walk and heel toe without difficulty.  Reflexes: 1+ and symmetric. Toes downgoing.         ASSESSMENT/PLAN: Felicia Hampton is a 67 y.o. year old female    OSA on CPAP : Compliance report shows satisfactory usage with optimal residual AHI.  Continue current pressure settings.  Will request patient obtain chinstrap to see if this helps with nasal pillow mask use otherwise continue to use FFM.  Discussed continued nightly usage with ensuring greater than 4 hours nightly for optimal benefit and per insurance  purposes.  Continue to  follow with DME company for any needed supplies or CPAP related concerns     Follow up in 1 year or call earlier if needed   CC:  PCP: Tracey Harries, MD    I spent 25 minutes of face-to-face and non-face-to-face time with patient.  This included previsit chart review, lab review, study review, order entry, electronic health record documentation, patient education and discussion regarding above diagnoses and treatment plan and answered all other questions to patient's satisfaction   Ihor Austin, [facility]  [facility] [address] [place], [place] [address]  Phone [phone] Fax [phone] Note: This document was prepared with digital dictation and possible smart phrase technology. Any transcriptional errors that result from this process are unintentional.

## 2023-02-09 ENCOUNTER — Ambulatory Visit: Payer: Medicare HMO | Admitting: Adult Health

## 2023-02-09 ENCOUNTER — Encounter: Payer: Self-pay | Admitting: Adult Health

## 2023-02-09 VITALS — BP 115/72 | HR 77 | Ht 61.0 in | Wt 192.0 lb

## 2023-02-09 DIAGNOSIS — G4733 Obstructive sleep apnea (adult) (pediatric): Secondary | ICD-10-CM | POA: Diagnosis not present

## 2023-02-09 NOTE — Patient Instructions (Signed)
Continue use of CPAP for sleep apnea management

## 2023-04-05 ENCOUNTER — Other Ambulatory Visit: Payer: Self-pay

## 2023-04-05 ENCOUNTER — Ambulatory Visit: Payer: Medicare HMO | Admitting: Family Medicine

## 2023-04-05 ENCOUNTER — Encounter: Payer: Self-pay | Admitting: Family Medicine

## 2023-04-05 VITALS — BP 108/66 | HR 88 | Ht 61.0 in | Wt 188.0 lb

## 2023-04-05 DIAGNOSIS — M65332 Trigger finger, left middle finger: Secondary | ICD-10-CM | POA: Diagnosis not present

## 2023-04-05 DIAGNOSIS — M1812 Unilateral primary osteoarthritis of first carpometacarpal joint, left hand: Secondary | ICD-10-CM | POA: Diagnosis not present

## 2023-04-05 DIAGNOSIS — M79645 Pain in left finger(s): Secondary | ICD-10-CM | POA: Diagnosis not present

## 2023-04-05 MED ORDER — MELOXICAM 15 MG PO TABS: 15.0000 mg | ORAL_TABLET | ORAL | 3 refills | Status: DC | PRN

## 2023-04-05 NOTE — Assessment & Plan Note (Signed)
Repeat injection given today and tolerated the procedure well, discussed icing regimen and home exercises, discussed avoiding certain activities.  Will follow-up again in 6 to 8 weeks if needed but otherwise patient likely will do well for quite some time.  Massage has been helpful for her previously

## 2023-04-05 NOTE — Progress Notes (Signed)
[name] [address] [place] [address] Phone: [phone] Subjective:   Bruce Donath, am serving as a scribe for Dr. Antoine Primas.  I'm seeing this patient by the request  of:  Tracey Harries, MD  CC: thumb pain   [id number]  Felicia Hampton is a 67 y.o. female coming in with complaint of L thumb pain. Pain in [facility] joint.   Also having triggering in middle finger L hand.   Since we have seen patient she has been taken off the tamoxifen with patient already completing 5 years of therapy.       Past Medical History:  Diagnosis Date   Adult ADHD    Breast cancer (HCC) 2018   Left Breast Cancer   Cancer (HCC) 2018   left breast   Family history of breast cancer    Family history of colon cancer    Family history of ovarian cancer    Genetic testing 08/02/2016   Negative; No pathogenic mutations detected. STAT Breast panel with reflex to Common Cancers panel  Genes tested were the 43 genes on [facility]'s Common Cancers panel (APC, ATM, AXIN2, BARD1, BMPR1A, BRCA1, BRCA2, BRIP1, CDH1, CDKN2A, CHEK2, DICER1, EPCAM, GREM1, HOXB13, KIT, MEN1, MLH1, MSH2, MSH6, MUTYH, NBN, NF1, PALB2, PDGFRA, PMS2, POLD1, POLE, PTEN, RAD50, RAD51C, RAD51D, SDHA, SDHB, SDHC, SDHD   History of radiation therapy 10/10/16-11/24/16   left breast 50.4 Gy in 28 fractions, left breast boost 12 Gy in 6 fractions   Hypercholesteremia    Melanoma (HCC)    left arm   Personal history of radiation therapy 2018   Left Breast Cancer   PONV (postoperative nausea and vomiting)    Past Surgical History:  Procedure Laterality Date   ABDOMINAL HYSTERECTOMY     BREAST LUMPECTOMY Left 08/11/2016   BREAST REDUCTION SURGERY Bilateral 08/23/2016   Procedure: BILATERAL ONCOPLASTIC BREAST REDUCTION;  Surgeon: Glenna Fellows, MD;  Location: [facility];  Service: Plastics;  Laterality: Bilateral;   KNEE ARTHROSCOPY     x3   RADIOACTIVE SEED GUIDED PARTIAL  MASTECTOMY WITH AXILLARY SENTINEL LYMPH NODE BIOPSY Left 08/11/2016   Procedure: RADIOACTIVE SEED GUIDED LEFT BREAST LUMPECTOMY WITH AXILLARY SENTINEL LYMPH NODE BIOPSY;  Surgeon: Emelia Loron, MD;  Location: [facility];  Service: General;  Laterality: Left;   REDUCTION MAMMAPLASTY     SHOULDER ARTHROSCOPY W/ ROTATOR CUFF REPAIR Right    Social History   Socioeconomic History   Marital status: Single    Spouse name: Not on file   Number of children: 1   Years of education: Not on file   Highest education level: Not on file  Occupational History   Occupation: RN  Tobacco Use   Smoking status: Former    Current packs/day: 0.00    Average packs/day: 0.5 packs/day for 10.0 years (5.0 ttl pk-yrs)    Types: Cigarettes    Start date: 08/02/1978    Quit date: 08/02/1988    Years since quitting: 34.6   Smokeless tobacco: Never  Substance and Sexual Activity   Alcohol use: Yes    Comment: socially   Drug use: No   Sexual activity: Not on file  Other Topics Concern   Not on file  Social History Narrative   Left handed   1 cup coffee every morning   Social Determinants of Health   Financial Resource Strain: Low Risk  (01/16/2023)   Received from [facility]   Overall Financial Resource  Strain (CARDIA)    Difficulty of Paying Living Expenses: Not hard at all  Food Insecurity: No Food Insecurity (01/16/2023)   Received from [facility]   Hunger Vital Sign    Worried About Running Out of Food in the Last Year: Never true    Ran Out of Food in the Last Year: Never true  Transportation Needs: No Transportation Needs (01/16/2023)   Received from [facility] - Transportation    Lack of Transportation (Medical): No    Lack of Transportation (Non-Medical): No  Physical Activity: Insufficiently Active (01/16/2023)   Received from [facility]   Exercise Vital Sign    Days of Exercise per Week: 3 days    Minutes of Exercise per Session: 30 min  Stress: No  Stress Concern Present (01/16/2023)   Received from [facility] - Occupational Stress Questionnaire    Feeling of Stress : Not at all  Social Connections: Socially Integrated (01/16/2023)   Received from [facility]   Social Network    How would you rate your social network (family, work, friends)?: Good participation with social networks   Allergies  Allergen Reactions   Ace Inhibitors Palpitations and Shortness Of Breath    COX-2 inhibitors   Diclofenac     Other reaction(s): Hypotension (ALLERGY/intolerance)   Morphine Nausea And Vomiting   Morphine And Codeine Nausea And Vomiting   Oxycodone Other (See Comments)    hallucinations   Family History  Problem Relation Age of Onset   Colon cancer Sister 55       deceased 60   Melanoma Brother        on back; currently 31   Ovarian cancer Maternal Aunt    Colon cancer Paternal Grandmother 58       deceased   Breast cancer Cousin 86       mat female cousin; daughter of unaffected mat aunt; deceased 84   Prostate cancer Father        deceased 103     Current Outpatient Medications (Cardiovascular):    atorvastatin (LIPITOR) 20 MG tablet, Take 20 mg by mouth daily at 6 PM.  Current Outpatient Medications (Respiratory):    cetirizine (ZYRTEC) 10 MG tablet, Take by mouth.   triamcinolone (NASACORT ALLERGY 24HR) 55 MCG/ACT AERO nasal inhaler, Place 2 sprays into the nose daily.  Current Outpatient Medications (Analgesics):    meloxicam (MOBIC) 15 MG tablet, Take 1 tablet (15 mg total) by mouth as needed.   Current Outpatient Medications (Other):    cholecalciferol (VITAMIN D3) 25 MCG (1000 UNIT) tablet, Take by mouth.   Multiple Vitamin (MULTIVITAMIN) capsule, Take by mouth.   venlafaxine XR (EFFEXOR-XR) 75 MG 24 hr capsule, TAKE 1 CAPSULE BY MOUTH DAILY WITH BREAKFAST. (Patient taking differently: Take 37.5 mg by mouth daily with breakfast. 37.5 mg)   Vitamin D, Ergocalciferol,  (DRISDOL) 1.25 MG (50000 UNIT) CAPS capsule, TAKE 1 CAPSULE BY MOUTH WEEKLY FOR 48 doses   zolpidem (AMBIEN) 10 MG tablet, Take 10 mg by mouth at bedtime as needed for sleep.   Reviewed prior external information including notes and imaging from  primary care provider As well as notes that were available from care everywhere and other healthcare systems.  Past medical history, social, surgical and family history all reviewed in electronic medical record.  No pertanent information unless stated regarding to the chief complaint.   Review of Systems:  No headache, visual changes, nausea, vomiting,  diarrhea, constipation, dizziness, abdominal pain, skin rash, fevers, chills, night sweats, weight loss, swollen lymph nodes, body aches, joint swelling, chest pain, shortness of breath, mood changes. POSITIVE muscle aches  Objective  Blood pressure 108/66, pulse 88, height 5\' 1"  (1.549 m), weight 188 lb (85.3 kg), SpO2 98%.   General: No apparent distress alert and oriented x3 mood and affect normal, dressed appropriately.  HEENT: Pupils equal, extraocular movements intact  Respiratory: Patient's speak in full sentences and does not appear short of breath  Cardiovascular: No lower extremity edema, non tender, no erythema  Thumb exam shows TTP positive grind test  Trigger nodule at the the A2 pully   Procedure: Real-time Ultrasound Guided Injection of the [facility] joint left side Device: GE Logiq Q7 Ultrasound guided injection is preferred based studies that show increased duration, increased effect, greater accuracy, decreased procedural pain, increased response rate, and decreased cost with ultrasound guided versus blind injection.  Verbal informed consent obtained.  Time-out conducted.  Noted no overlying erythema, induration, or other signs of local infection.  Skin prepped in a sterile fashion.  Local anesthesia: Topical Ethyl chloride.  With sterile technique and under real time ultrasound  guidance: With a 25-gauge 1/2 inch with 0.5 cc of 0.5% Marcaine and 0.5 cc of Kenalog 40 mg/mL Completed without difficulty  Pain immediately improved this is eczema suggesting accurate placement of the medication.  Advised to call if fevers/chills, erythema, induration, drainage, or persistent bleeding.  Impression: Technically successful ultrasound guided injection.  Procedure: Real-time Ultrasound Guided Injection of left flexor tendon sheath Device: GE Logiq Q7 Ultrasound guided injection is preferred based studies that show increased duration, increased effect, greater accuracy, decreased procedural pain, increased response rate, and decreased cost with ultrasound guided versus blind injection.  Verbal informed consent obtained.  Time-out conducted.  Noted no overlying erythema, induration, or other signs of local infection.  Skin prepped in a sterile fashion.  Local anesthesia: Topical Ethyl chloride.  With sterile technique and under real time ultrasound guidance: With a 25-gauge half inch needle injected with 0.5 cc of 0.5% Marcaine and 0.5 cc of Kenalog 40 mg/mL Completed without difficulty  Advised to call if fevers/chills, erythema, induration, drainage, or persistent bleeding.  Impression: Technically successful ultrasound guided injection.    Impression and Recommendations:    The above documentation has been reviewed and is accurate and complete Judi Saa, DO

## 2023-04-05 NOTE — Assessment & Plan Note (Signed)
Chronic, with worsening symptoms.  Given another injection today, tolerated the procedure well, discussed icing regimen and home exercises, discussed which activities to do and which ones to avoid.  Increase activity slowly.  Follow-up again in 6 to 8 weeks otherwise

## 2023-04-05 NOTE — Patient Instructions (Signed)
Great to see you  Ice 20 minutes 2 times daily. Usually after activity and before bed. Or heat if feel better  See me agai nin 3 months if you need me!!!!

## 2023-07-18 ENCOUNTER — Other Ambulatory Visit: Payer: Self-pay | Admitting: Family Medicine

## 2023-07-18 DIAGNOSIS — Z1231 Encounter for screening mammogram for malignant neoplasm of breast: Secondary | ICD-10-CM

## 2023-07-25 ENCOUNTER — Ambulatory Visit: Payer: Medicare HMO

## 2023-08-03 ENCOUNTER — Ambulatory Visit
Admission: RE | Admit: 2023-08-03 | Discharge: 2023-08-03 | Disposition: A | Discharge status: Home / Self Care | Payer: Medicare HMO | Source: Ambulatory Visit | Attending: Family Medicine | Admitting: Family Medicine

## 2023-08-03 DIAGNOSIS — Z1231 Encounter for screening mammogram for malignant neoplasm of breast: Secondary | ICD-10-CM

## 2023-08-16 ENCOUNTER — Ambulatory Visit: Payer: Medicare HMO | Admitting: Family Medicine

## 2023-08-16 ENCOUNTER — Other Ambulatory Visit: Payer: Self-pay

## 2023-08-16 VITALS — BP 118/72 | HR 104 | Ht 61.0 in | Wt 174.0 lb

## 2023-08-16 DIAGNOSIS — M79645 Pain in left finger(s): Secondary | ICD-10-CM

## 2023-08-16 DIAGNOSIS — M79644 Pain in right finger(s): Secondary | ICD-10-CM

## 2023-08-16 DIAGNOSIS — M1812 Unilateral primary osteoarthritis of first carpometacarpal joint, left hand: Secondary | ICD-10-CM

## 2023-08-16 NOTE — Progress Notes (Signed)
 [facility] [address] [place] [address] Phone: [phone] Subjective:    I'm seeing this patient by the request  of:  Pura Lenis, MD  CC: thumb pain   Felicia Hampton  Felicia Hampton is a 68 y.o. female coming in with complaint of continued thumb pain. Patient states that pain is constant and is waking her up.      Past Medical History:  Diagnosis Date   Adult ADHD    Breast cancer (HCC) 2018   Left Breast Cancer   Cancer (HCC) 2018   left breast   Family history of breast cancer    Family history of colon cancer    Family history of ovarian cancer    Genetic testing 08/02/2016   Negative; No pathogenic mutations detected. STAT Breast panel with reflex to Common Cancers panel  Genes tested were the 43 genes on [facility]'s Common Cancers panel (APC, ATM, AXIN2, BARD1, BMPR1A, BRCA1, BRCA2, BRIP1, CDH1, CDKN2A, CHEK2, DICER1, EPCAM, GREM1, HOXB13, KIT, MEN1, MLH1, MSH2, MSH6, MUTYH, NBN, NF1, PALB2, PDGFRA, PMS2, POLD1, POLE, PTEN, RAD50, RAD51C, RAD51D, SDHA, SDHB, SDHC, SDHD   History of radiation therapy 10/10/16-11/24/16   left breast 50.4 Gy in 28 fractions, left breast boost 12 Gy in 6 fractions   Hypercholesteremia    Melanoma (HCC)    left arm   Personal history of radiation therapy 2018   Left Breast Cancer   PONV (postoperative nausea and vomiting)    Past Surgical History:  Procedure Laterality Date   ABDOMINAL HYSTERECTOMY     BREAST LUMPECTOMY Left 08/11/2016   BREAST REDUCTION SURGERY Bilateral 08/23/2016   Procedure: BILATERAL ONCOPLASTIC BREAST REDUCTION;  Surgeon: Earlis Ranks, MD;  Location: [facility];  Service: Plastics;  Laterality: Bilateral;   KNEE ARTHROSCOPY     x3   RADIOACTIVE SEED GUIDED PARTIAL MASTECTOMY WITH AXILLARY SENTINEL LYMPH NODE BIOPSY Left 08/11/2016   Procedure: RADIOACTIVE SEED GUIDED LEFT BREAST LUMPECTOMY WITH AXILLARY SENTINEL LYMPH NODE BIOPSY;  Surgeon: Donnice Bury,  MD;  Location: [facility];  Service: General;  Laterality: Left;   REDUCTION MAMMAPLASTY     SHOULDER ARTHROSCOPY W/ ROTATOR CUFF REPAIR Right    Social History   Socioeconomic History   Marital status: Single    Spouse name: Not on file   Number of children: 1   Years of education: Not on file   Highest education level: Not on file  Occupational History   Occupation: RN  Tobacco Use   Smoking status: Former    Current packs/day: 0.00    Average packs/day: 0.5 packs/day for 10.0 years (5.0 ttl pk-yrs)    Types: Cigarettes    Start date: 08/02/1978    Quit date: 08/02/1988    Years since quitting: 35.0   Smokeless tobacco: Never  Substance and Sexual Activity   Alcohol use: Yes    Comment: socially   Drug use: No   Sexual activity: Not on file  Other Topics Concern   Not on file  Social History Narrative   Left handed   1 cup coffee every morning   Social Drivers of Health   Financial Resource Strain: Low Risk  (01/16/2023)   Received from [facility]   Overall Financial Resource Strain (CARDIA)    Difficulty of Paying Living Expenses: Not hard at all  Food Insecurity: No Food Insecurity (01/16/2023)   Received from [facility]   Hunger Vital Sign    Worried About Running  Out of Food in the Last Year: Never true    Ran Out of Food in the Last Year: Never true  Transportation Needs: No Transportation Needs (01/16/2023)   Received [facility] - Transportation    Lack of Transportation (Medical): No    Lack of Transportation (Non-Medical): No  Physical Activity: Insufficiently Active (01/16/2023)   Received from [facility]   Exercise Vital Sign    Days of Exercise per Week: 3 days    Minutes of Exercise per Session: 30 min  Stress: No Stress Concern Present (01/16/2023)   Received from [facility] - Occupational Stress Questionnaire    Feeling of Stress : Not at all  Social Connections:  Socially Integrated (01/16/2023)   Received from [facility]   Social Network    How would you rate your social network (family, work, friends)?: Good participation with social networks   Allergies  Allergen Reactions   Ace Inhibitors Palpitations and Shortness Of Breath    COX-2 inhibitors   Diclofenac      Other reaction(s): Hypotension (ALLERGY/intolerance)   Morphine Nausea And Vomiting   Morphine And Codeine Nausea And Vomiting   Oxycodone Other (See Comments)    hallucinations   Family History  Problem Relation Age of Onset   Colon cancer Sister 46       deceased 24   Melanoma Brother        on back; currently 96   Ovarian cancer Maternal Aunt    Colon cancer Paternal Grandmother 79       deceased   Breast cancer Cousin 67       mat female cousin; daughter of unaffected mat aunt; deceased 47   Prostate cancer Father        deceased 62     Current Outpatient Medications (Cardiovascular):    atorvastatin (LIPITOR) 20 MG tablet, Take 20 mg by mouth daily at 6 PM.  Current Outpatient Medications (Respiratory):    cetirizine (ZYRTEC) 10 MG tablet, Take by mouth.   triamcinolone  (NASACORT  ALLERGY 24HR) 55 MCG/ACT AERO nasal inhaler, Place 2 sprays into the nose daily.  Current Outpatient Medications (Analgesics):    meloxicam  (MOBIC ) 15 MG tablet, Take 1 tablet (15 mg total) by mouth as needed.   Current Outpatient Medications (Other):    cholecalciferol (VITAMIN D3) 25 MCG (1000 UNIT) tablet, Take by mouth.   Multiple Vitamin (MULTIVITAMIN) capsule, Take by mouth.   venlafaxine  XR (EFFEXOR -XR) 75 MG 24 hr capsule, TAKE 1 CAPSULE BY MOUTH DAILY WITH BREAKFAST. (Patient taking differently: Take 37.5 mg by mouth daily with breakfast. 37.5 mg)   Vitamin D, Ergocalciferol, (DRISDOL) 1.25 MG (50000 UNIT) CAPS capsule, TAKE 1 CAPSULE BY MOUTH WEEKLY FOR 48 doses   zolpidem (AMBIEN) 10 MG tablet, Take 10 mg by mouth at bedtime as needed for sleep.   Reviewed prior external  information including notes and imaging from  primary care provider As well as notes that were available from care everywhere and other healthcare systems.  Past medical history, social, surgical and family history all reviewed in electronic medical record.  No pertanent information unless stated regarding to the chief complaint.   Review of Systems:  No headache, visual changes, nausea, vomiting, diarrhea, constipation, dizziness, abdominal pain, skin rash, fevers, chills, night sweats, weight loss, swollen lymph nodes, body aches, joint swelling, chest pain, shortness of breath, mood changes. POSITIVE muscle aches  Objective  Blood pressure 118/72, pulse ROLLEN)  104, height 5' 1 (1.549 m), weight 174 lb (78.9 kg), SpO2 98%.   General: No apparent distress alert and oriented x3 mood and affect normal, dressed appropriately.  HEENT: Pupils equal, extraocular movements intact  Respiratory: Patient's speak in full sentences and does not appear short of breath  Cardiovascular: No lower extremity edema, non tender, no erythema   Left thumb exam shows swelling noted.  Positive grind test noted.  No trigger nodule noted today. Neurovascular intact distally.  Procedure: Real-time Ultrasound Guided Injection of left CMC joint Device: GE Logiq Q7 Ultrasound guided injection is preferred based studies that show increased duration, increased effect, greater accuracy, decreased procedural pain, increased response rate, and decreased cost with ultrasound guided versus blind injection.  Verbal informed consent obtained.  Time-out conducted.  Noted no overlying erythema, induration, or other signs of local infection.  Skin prepped in a sterile fashion.  Local anesthesia: Topical Ethyl chloride.  With sterile technique and under real time ultrasound guidance: With a 25-gauge half inch needle injected with 0.5 cc of 0.5% Marcaine  and 0.5 cc of Kenalog  40 mg/mL Completed without difficulty  Pain immediately  resolved suggesting accurate placement of the medication.  Advised to call if fevers/chills, erythema, induration, drainage, or persistent bleeding.  Images saved in patient's chart Impression: Technically successful ultrasound guided injection.     Impression and Recommendations:     The above documentation has been reviewed and is accurate and complete Tonique Mendonca M Carolene Gitto, DO

## 2023-08-16 NOTE — Patient Instructions (Signed)
 Injected thumb today See me when you get back

## 2023-08-17 ENCOUNTER — Encounter: Payer: Self-pay | Admitting: Family Medicine

## 2023-08-17 NOTE — Assessment & Plan Note (Signed)
 Patient hopefully will respond extremely well.  Have another follow-up in 3 to 4 months.  Patient will be [place] quite a significant duration.  Discussed icing regimen and home exercises otherwise.  Follow-up with me again in the stated interval patient knows that if these injections stop working we will need to consider the possibility of surgical intervention.  Continuing with the vitamin supplementations for the CPPD as well.

## 2023-11-14 ENCOUNTER — Ambulatory Visit: Payer: Medicare HMO | Admitting: Family Medicine

## 2023-11-14 ENCOUNTER — Other Ambulatory Visit: Payer: Self-pay

## 2023-11-14 ENCOUNTER — Encounter: Payer: Self-pay | Admitting: Family Medicine

## 2023-11-14 VITALS — BP 112/64 | HR 114 | Ht 61.0 in

## 2023-11-14 DIAGNOSIS — M79645 Pain in left finger(s): Secondary | ICD-10-CM

## 2023-11-14 DIAGNOSIS — M1812 Unilateral primary osteoarthritis of first carpometacarpal joint, left hand: Secondary | ICD-10-CM | POA: Diagnosis not present

## 2023-11-14 DIAGNOSIS — M65332 Trigger finger, left middle finger: Secondary | ICD-10-CM

## 2023-11-14 DIAGNOSIS — M79644 Pain in right finger(s): Secondary | ICD-10-CM

## 2023-11-14 NOTE — Progress Notes (Signed)
 [facility] [address] [place] [address] Phone: [phone] Subjective:   Felicia Hampton, am serving as a scribe for Dr. Ronnell Hampton.  I'm seeing this patient by the request  of:  Felicia Ina, MD  CC: Thumb pain follow-up [id number]  08/16/2023 Patient hopefully will respond extremely well.  Have another follow-up in 3 to 4 months.  Patient will be traveling [place] for quite a significant duration.  Discussed icing regimen and home exercises otherwise.  Follow-up with me again in the stated interval patient knows that if these injections stop working we will need to consider the possibility of surgical intervention.  Continuing with the vitamin supplementations for the CPPD as well.     Updated 11/14/2023 Felicia Hampton is a 68 y.o. female coming in with complaint of thumb pain. Did well after last appointment. Starting to feel pain in thumb again and locking in finger L hand.     Past Medical History:  Diagnosis Date   Adult ADHD    Breast cancer (HCC) 2018   Left Breast Cancer   Cancer (HCC) 2018   left breast   Family history of breast cancer    Family history of colon cancer    Family history of ovarian cancer    Genetic testing 08/02/2016   Negative; No pathogenic mutations detected. STAT Breast panel with reflex to Common Cancers panel  Genes tested were the 43 genes on [facility]'s Common Cancers panel (APC, ATM, AXIN2, BARD1, BMPR1A, BRCA1, BRCA2, BRIP1, CDH1, CDKN2A, CHEK2, DICER1, EPCAM, GREM1, HOXB13, KIT, MEN1, MLH1, MSH2, MSH6, MUTYH, NBN, NF1, PALB2, PDGFRA, PMS2, POLD1, POLE, PTEN, RAD50, RAD51C, RAD51D, SDHA, SDHB, SDHC, SDHD   History of radiation therapy 10/10/16-11/24/16   left breast 50.4 Gy in 28 fractions, left breast boost 12 Gy in 6 fractions   Hypercholesteremia    Melanoma (HCC)    left arm   Personal history of radiation therapy 2018   Left Breast Cancer   PONV (postoperative nausea and vomiting)    Past  Surgical History:  Procedure Laterality Date   ABDOMINAL HYSTERECTOMY     BREAST LUMPECTOMY Left 08/11/2016   BREAST REDUCTION SURGERY Bilateral 08/23/2016   Procedure: BILATERAL ONCOPLASTIC BREAST REDUCTION;  Surgeon: Alger Infield, MD;  Location: [facility];  Service: Plastics;  Laterality: Bilateral;   KNEE ARTHROSCOPY     x3   RADIOACTIVE SEED GUIDED PARTIAL MASTECTOMY WITH AXILLARY SENTINEL LYMPH NODE BIOPSY Left 08/11/2016   Procedure: RADIOACTIVE SEED GUIDED LEFT BREAST LUMPECTOMY WITH AXILLARY SENTINEL LYMPH NODE BIOPSY;  Surgeon: Enid Harry, MD;  Location: [facility];  Service: General;  Laterality: Left;   REDUCTION MAMMAPLASTY     SHOULDER ARTHROSCOPY W/ ROTATOR CUFF REPAIR Right    Social History   Socioeconomic History   Marital status: Single    Spouse name: Not on file   Number of children: 1   Years of education: Not on file   Highest education level: Not on file  Occupational History   Occupation: RN  Tobacco Use   Smoking status: Former    Current packs/day: 0.00    Average packs/day: 0.5 packs/day for 10.0 years (5.0 ttl pk-yrs)    Types: Cigarettes    Start date: 08/02/1978    Quit date: 08/02/1988    Years since quitting: 35.3   Smokeless tobacco: Never  Substance and Sexual Activity   Alcohol use: Yes    Comment: socially   Drug use: No  Sexual activity: Not on file  Other Topics Concern   Not on file  Social History Narrative   Left handed   1 cup coffee every morning   Social Drivers of Health   Financial Resource Strain: Low Risk  (10/03/2023)   Received from [facility]   Overall Financial Resource Strain (CARDIA)    Difficulty of Paying Living Expenses: Not hard at all  Food Insecurity: No Food Insecurity (10/03/2023)   Received from [facility]   Hunger Vital Sign    Worried About Running Out of Food in the Last Year: Never true    Ran Out of Food in the Last Year: Never true  Transportation Needs:  No Transportation Needs (10/03/2023)   Received from [facility] - Transportation    Lack of Transportation (Medical): No    Lack of Transportation (Non-Medical): No  Physical Activity: Insufficiently Active (01/16/2023)   Received from [facility]   Exercise Vital Sign    Days of Exercise per Week: 3 days    Minutes of Exercise per Session: 30 min  Stress: No Stress Concern Present (01/16/2023)   Received from [facility] of [facility]ational Health - Occupational Stress Questionnaire    Feeling of Stress : Not at all  Social Connections: Socially Integrated (01/16/2023)   Received from [facility]   Social Network    How would you rate your social network (family, work, friends)?: Good participation with social networks   Allergies  Allergen Reactions   Ace Inhibitors Palpitations and Shortness Of Breath    COX-2 inhibitors   Diclofenac      Other reaction(s): Hypotension (ALLERGY/intolerance)   Morphine Nausea And Vomiting   Morphine And Codeine Nausea And Vomiting   Oxycodone Other (See Comments)    hallucinations   Family History  Problem Relation Age of Onset   Colon cancer Sister 41       deceased 13   Melanoma Brother        on back; currently 57   Ovarian cancer Maternal Aunt    Colon cancer Paternal Grandmother 5       deceased   Breast cancer Cousin 53       mat female cousin; daughter of unaffected mat aunt; deceased 68   Prostate cancer Father        deceased 67     Current Outpatient Medications (Cardiovascular):    atorvastatin (LIPITOR) 20 MG tablet, Take 20 mg by mouth daily at 6 PM.  Current Outpatient Medications (Respiratory):    cetirizine (ZYRTEC) 10 MG tablet, Take by mouth.   triamcinolone  (NASACORT  ALLERGY 24HR) 55 MCG/ACT AERO nasal inhaler, Place 2 sprays into the nose daily.  Current Outpatient Medications (Analgesics):    meloxicam  (MOBIC ) 15 MG tablet, Take 1 tablet (15 mg total) by mouth as  needed.   Current Outpatient Medications (Other):    cholecalciferol (VITAMIN D3) 25 MCG (1000 UNIT) tablet, Take by mouth.   Multiple Vitamin (MULTIVITAMIN) capsule, Take by mouth.   venlafaxine  XR (EFFEXOR -XR) 75 MG 24 hr capsule, TAKE 1 CAPSULE BY MOUTH DAILY WITH BREAKFAST. (Patient taking differently: Take 37.5 mg by mouth daily with breakfast. 37.5 mg)   Vitamin D, Ergocalciferol, (DRISDOL) 1.25 MG (50000 UNIT) CAPS capsule, TAKE 1 CAPSULE BY MOUTH WEEKLY FOR 48 doses   zolpidem (AMBIEN) 10 MG tablet, Take 10 mg by mouth at bedtime as needed for sleep.   Reviewed prior external information including notes and imaging from  primary care provider As well as notes that were available from care everywhere and other healthcare systems.  Past medical history, social, surgical and family history all reviewed in electronic medical record.  No pertanent information unless stated regarding to the chief complaint.   Review of Systems:  No headache, visual changes, nausea, vomiting, diarrhea, constipation, dizziness, abdominal pain, skin rash, fevers, chills, night sweats, weight loss, swollen lymph nodes, body aches, joint swelling, chest pain, shortness of breath, mood changes. POSITIVE muscle aches  Objective  There were no vitals taken for this visit.   General: No apparent distress alert and oriented x3 mood and affect normal, dressed appropriately.  HEENT: Pupils equal, extraocular movements intact  Respiratory: Patient's speak in full sentences and does not appear short of breath  Cardiovascular: No lower extremity edema, non tender, no erythema   Hand exam shows positive grind test of the left CMC joint.  No trigger nodule noted at the A2 pulley.  The rest of the hand appears to be unremarkable  Procedure: Real-time Ultrasound Guided Injection of left CMC joint Device: GE Logiq Q7 Ultrasound guided injection is preferred based studies that show increased duration, increased effect,  greater accuracy, decreased procedural pain, increased response rate, and decreased cost with ultrasound guided versus blind injection.  Verbal informed consent obtained.  Time-out conducted.  Noted no overlying erythema, induration, or other signs of local infection.  Skin prepped in a sterile fashion.  Local anesthesia: Topical Ethyl chloride.  With sterile technique and under real time ultrasound guidance: With a 25-gauge half inch needle injected with 0.5 cc of 0.5% Marcaine  and 0.5 cc of Kenalog  40 mg/mL Completed without difficulty  Pain immediately resolved suggesting accurate placement of the medication.  Advised to call if fevers/chills, erythema, induration, drainage, or persistent bleeding.  Impression: Technically successful ultrasound guided injection.  Procedure: Real-time Ultrasound Guided Injection of left middle finger flexor tendon sheath Device: GE Logiq Q7 Ultrasound guided injection is preferred based studies that show increased duration, increased effect, greater accuracy, decreased procedural pain, increased response rate, and decreased cost with ultrasound guided versus blind injection.  Verbal informed consent obtained.  Time-out conducted.  Noted no overlying erythema, induration, or other signs of local infection.  Skin prepped in a sterile fashion.  Local anesthesia: Topical Ethyl chloride.  With sterile technique and under real time ultrasound guidance: With a 25-gauge half inch needle injected with 0.5 cc of 0.5% Marcaine  and 0.5 cc of Kenalog  40 mg/mL Completed without difficulty  Pain immediately resolved suggesting accurate placement of the medication.  Advised to call if fevers/chills, erythema, induration, drainage, or persistent bleeding.  Impression: Technically successful ultrasound guided injection.   Impression and Recommendations:    The above documentation has been reviewed and is accurate and complete Felicia Milbrath M Loi Rennaker, DO

## 2023-11-14 NOTE — Assessment & Plan Note (Signed)
 Still avoiding any type of surgical intervention.  Continue to be active otherwise.  Patient has traveled recently.  Still getting significant benefit from injections when needed.  Follow-up again 3 months

## 2023-11-14 NOTE — Assessment & Plan Note (Signed)
 Repeat injection given.  He tolerated the procedure well, discussed icing regimen of home exercises, increase activity slowly.  Follow-up again in 6 to 8 weeks

## 2023-11-14 NOTE — Patient Instructions (Signed)
 Injection in finger and thumb today Good to see you! See you again in 10 weeks

## 2024-01-18 NOTE — Progress Notes (Addendum)
 Felicia Hampton [address] [place] [address] Phone: [phone] Subjective:   LILLETTE Berwyn Posey, am serving as a scribe for Dr. Arthea Claudene.  I'm seeing this patient by the request  of:  Pura Lenis, MD  CC: Left thumb pain  [name]  11/14/2023 Repeat injection given.  He tolerated the procedure well, discussed icing regimen of home exercises, increase activity slowly.  Follow-up again in 6 to 8 weeks     Still avoiding any type of surgical intervention.  Continue to be active otherwise.  Patient has traveled recently.  Still getting significant benefit from injections when needed.  Follow-up again 3 months     Updated 01/23/2024 Felicia Hampton is a 68 y.o. female coming in with complaint of finger and thumb pain. Trigger finger is doing well. Thumb needs an injetction.        Past Medical History:  Diagnosis Date   Adult ADHD    Breast cancer (HCC) 2018   Left Breast Cancer   Cancer (HCC) 2018   left breast   Family history of breast cancer    Family history of colon cancer    Family history of ovarian cancer    Genetic testing 08/02/2016   Negative; No pathogenic mutations detected. STAT Breast panel with reflex to Common Cancers panel  Genes tested were the 43 genes on [employer]'s Common Cancers panel (APC, ATM, AXIN2, BARD1, BMPR1A, BRCA1, BRCA2, BRIP1, CDH1, CDKN2A, CHEK2, DICER1, EPCAM, GREM1, HOXB13, KIT, MEN1, MLH1, MSH2, MSH6, MUTYH, NBN, NF1, PALB2, PDGFRA, PMS2, POLD1, POLE, PTEN, RAD50, RAD51C, RAD51D, SDHA, SDHB, SDHC, SDHD   History of radiation therapy 10/10/16-11/24/16   left breast 50.4 Gy in 28 fractions, left breast boost 12 Gy in 6 fractions   Hypercholesteremia    Melanoma (HCC)    left arm   Personal history of radiation therapy 2018   Left Breast Cancer   PONV (postoperative nausea and vomiting)    Past Surgical History:  Procedure Laterality Date   ABDOMINAL HYSTERECTOMY     BREAST LUMPECTOMY Left 08/11/2016    BREAST REDUCTION SURGERY Bilateral 08/23/2016   Procedure: BILATERAL ONCOPLASTIC BREAST REDUCTION;  Surgeon: Earlis Ranks, MD;  Location: [facility];  Service: Plastics;  Laterality: Bilateral;   KNEE ARTHROSCOPY     x3   RADIOACTIVE SEED GUIDED PARTIAL MASTECTOMY WITH AXILLARY SENTINEL LYMPH NODE BIOPSY Left 08/11/2016   Procedure: RADIOACTIVE SEED GUIDED LEFT BREAST LUMPECTOMY WITH AXILLARY SENTINEL LYMPH NODE BIOPSY;  Surgeon: Donnice Bury, MD;  Location: [facility];  Service: General;  Laterality: Left;   REDUCTION MAMMAPLASTY     SHOULDER ARTHROSCOPY W/ ROTATOR CUFF REPAIR Right    Social History   Socioeconomic History   Marital status: Single    Spouse name: Not on file   Number of children: 1   Years of education: Not on file   Highest education level: Not on file  Occupational History   Occupation: RN  Tobacco Use   Smoking status: Former    Current packs/day: 0.00    Average packs/day: 0.5 packs/day for 10.0 years (5.0 ttl pk-yrs)    Types: Cigarettes    Start date: 08/02/1978    Quit date: 08/02/1988    Years since quitting: 35.4   Smokeless tobacco: Never  Substance and Sexual Activity   Alcohol use: Yes    Comment: socially   Drug use: No   Sexual activity: Not on file  Other Topics Concern  Not on file  Social History Narrative   Left handed   1 cup coffee every morning   Social Drivers of Health   Financial Resource Strain: Low Risk  (11/24/2023)   Received from [facility]   Overall Financial Resource Strain (CARDIA)    Difficulty of Paying Living Expenses: Not hard at all  Food Insecurity: No Food Insecurity (11/24/2023)   Received from [facility]   Hunger Vital Sign    Within the past 12 months, you worried that your food would run out before you got the money to buy more.: Never true    Within the past 12 months, the food you bought just didn't last and you didn't have money to get more.: Never true   Transportation Needs: No Transportation Needs (11/24/2023)   Received from [facility] - Transportation    Lack of Transportation (Medical): No    Lack of Transportation (Non-Medical): No  Physical Activity: Sufficiently Active (11/24/2023)   Received from [facility]   Exercise Vital Sign    On average, how many days per week do you engage in moderate to strenuous exercise (like a brisk walk)?: 3 days    On average, how many minutes do you engage in exercise at this level?: 50 min  Stress: No Stress Concern Present (11/24/2023)   Received from [facility] of Occupational [facility] - Occupational Stress Questionnaire    Feeling of Stress : Not at all  Social Connections: Socially Integrated (11/24/2023)   Received from [facility]   Social Network    How would you rate your social network (family, work, friends)?: Good participation with social networks   Allergies  Allergen Reactions   Ace Inhibitors Palpitations and Shortness Of Breath    COX-2 inhibitors   Diclofenac      Other reaction(s): Hypotension (ALLERGY/intolerance)   Morphine Nausea And Vomiting   Morphine And Codeine Nausea And Vomiting   Oxycodone Other (See Comments)    hallucinations   Family History  Problem Relation Age of Onset   Colon cancer Sister 20       deceased 6   Melanoma Brother        on back; currently 84   Ovarian cancer Maternal Aunt    Colon cancer Paternal Grandmother 65       deceased   Breast cancer Cousin 35       mat female cousin; daughter of unaffected mat aunt; deceased 57   Prostate cancer Father        deceased 76     Current Outpatient Medications (Cardiovascular):    atorvastatin (LIPITOR) 20 MG tablet, Take 20 mg by mouth daily at 6 PM.  Current Outpatient Medications (Respiratory):    cetirizine (ZYRTEC) 10 MG tablet, Take by mouth.   triamcinolone  (NASACORT  ALLERGY 24HR) 55 MCG/ACT AERO nasal inhaler, Place 2 sprays into the nose  daily.  Current Outpatient Medications (Analgesics):    meloxicam  (MOBIC ) 15 MG tablet, Take 1 tablet (15 mg total) by mouth as needed.   Current Outpatient Medications (Other):    cholecalciferol (VITAMIN D3) 25 MCG (1000 UNIT) tablet, Take by mouth.   Multiple Vitamin (MULTIVITAMIN) capsule, Take by mouth.   venlafaxine  XR (EFFEXOR -XR) 75 MG 24 hr capsule, TAKE 1 CAPSULE BY MOUTH DAILY WITH BREAKFAST. (Patient taking differently: Take 37.5 mg by mouth daily with breakfast. 37.5 mg)   Vitamin D, Ergocalciferol, (DRISDOL) 1.25 MG (50000 UNIT) CAPS capsule, TAKE 1 CAPSULE BY MOUTH  WEEKLY FOR 48 doses   zolpidem (AMBIEN) 10 MG tablet, Take 10 mg by mouth at bedtime as needed for sleep.   Reviewed prior external information including notes and imaging from  primary care provider As well as notes that were available from care everywhere and other healthcare systems.  Past medical history, social, surgical and family history all reviewed in electronic medical record.  No pertanent information unless stated regarding to the chief complaint.   Review of Systems:  No headache, visual changes, nausea, vomiting, diarrhea, constipation, dizziness, abdominal pain, skin rash, fevers, chills, night sweats, weight loss, swollen lymph nodes, body aches, joint swelling, chest pain, shortness of breath, mood changes. POSITIVE muscle aches  Objective  Blood pressure 110/70, pulse 66, height 5' 1 (1.549 m), SpO2 98%.   General: No apparent distress alert and oriented x3 mood and affect normal, dressed appropriately.  HEENT: Pupils equal, extraocular movements intact  Respiratory: Patient's speak in full sentences and does not appear short of breath  Cardiovascular: No lower extremity edema, non tender, no erythema  Left thumb exam shows that there is some swelling noted.  Positive grind test.  No true triggering noted.  Procedure: Real-time Ultrasound Guided Injection of left CMC joint Device: GE Logiq  Q7 Ultrasound guided injection is preferred based studies that show increased duration, increased effect, greater accuracy, decreased procedural pain, increased response rate, and decreased cost with ultrasound guided versus blind injection.  Verbal informed consent obtained.  Time-out conducted.  Noted no overlying erythema, induration, or other signs of local infection.  Skin prepped in a sterile fashion.  Local anesthesia: Topical Ethyl chloride.  With sterile technique and under real time ultrasound guidance: With a 25-gauge half inch needle injecting 0.5 cc of 0.5% Marcaine  and 0.5 cc of Kenalog  40 mg/mL Completed without difficulty  Pain immediately resolved suggesting accurate placement of the medication.  Images saved Advised to call if fevers/chills, erythema, induration, drainage, or persistent bleeding.  Impression: Technically successful ultrasound guided injection.    Impression and Recommendations:

## 2024-01-23 ENCOUNTER — Ambulatory Visit: Admitting: Family Medicine

## 2024-01-23 ENCOUNTER — Other Ambulatory Visit: Payer: Self-pay

## 2024-01-23 ENCOUNTER — Encounter: Payer: Self-pay | Admitting: Family Medicine

## 2024-01-23 VITALS — BP 110/70 | HR 66 | Ht 61.0 in

## 2024-01-23 DIAGNOSIS — M79644 Pain in right finger(s): Secondary | ICD-10-CM | POA: Diagnosis not present

## 2024-01-23 DIAGNOSIS — M1812 Unilateral primary osteoarthritis of first carpometacarpal joint, left hand: Secondary | ICD-10-CM | POA: Diagnosis not present

## 2024-01-23 NOTE — Assessment & Plan Note (Signed)
 Arthritis noted.  Has had trigger thumb as well that we will need to monitor.  Has done extremely well with this.  Does have pseudogout that we will need to continue to monitor as well but did not see any significant amount of recurrent inflammation.  Continue to keep a.  Active.  Patient will do bracing as needed.  Follow-up with me again in 3 months

## 2024-01-23 NOTE — Patient Instructions (Signed)
Injected thumb today

## 2024-04-11 ENCOUNTER — Other Ambulatory Visit: Payer: Self-pay | Admitting: Family Medicine

## 2024-04-11 NOTE — Progress Notes (Unsigned)
 Darlyn Claudene [facility] [address] [place] [address] Phone: [phone] Subjective:   Felicia Hampton am a scribe for Dr. Claudene.  I'm seeing this patient by the request  of:  Pura Lenis, MD  CC: Finger pain follow-up  [name]  01/23/2024 Arthritis noted.  Has had trigger thumb as well that we will need to monitor.  Has done extremely well with this.  Does have pseudogout that we will need to continue to monitor as well but did not see any significant amount of recurrent inflammation.  Continue to keep a.  Active.  Patient will do bracing as needed.  Follow-up with me again in 3 months     Updated 04/15/2024 Felicia Hampton is a 68 y.o. female coming in with complaint of finger pain. Patient states that the thumb pain is a 9/10 pain level today. Had to take the meloxicam  today. Would like injection today.        Past Medical History:  Diagnosis Date   Adult ADHD    Breast cancer (HCC) 2018   Left Breast Cancer   Cancer (HCC) 2018   left breast   Family history of breast cancer    Family history of colon cancer    Family history of ovarian cancer    Genetic testing 08/02/2016   Negative; No pathogenic mutations detected. STAT Breast panel with reflex to Common Cancers panel  Genes tested were the 43 genes on [facility]'s Common Cancers panel (APC, ATM, AXIN2, BARD1, BMPR1A, BRCA1, BRCA2, BRIP1, CDH1, CDKN2A, CHEK2, DICER1, EPCAM, GREM1, HOXB13, KIT, MEN1, MLH1, MSH2, MSH6, MUTYH, NBN, NF1, PALB2, PDGFRA, PMS2, POLD1, POLE, PTEN, RAD50, RAD51C, RAD51D, SDHA, SDHB, SDHC, SDHD   History of radiation therapy 10/10/16-11/24/16   left breast 50.4 Gy in 28 fractions, left breast boost 12 Gy in 6 fractions   Hypercholesteremia    Melanoma (HCC)    left arm   Personal history of radiation therapy 2018   Left Breast Cancer   PONV (postoperative nausea and vomiting)    Past Surgical History:  Procedure Laterality Date   ABDOMINAL HYSTERECTOMY      BREAST LUMPECTOMY Left 08/11/2016   BREAST REDUCTION SURGERY Bilateral 08/23/2016   Procedure: BILATERAL ONCOPLASTIC BREAST REDUCTION;  Surgeon: Earlis Ranks, MD;  Location: [facility];  Service: Plastics;  Laterality: Bilateral;   KNEE ARTHROSCOPY     x3   RADIOACTIVE SEED GUIDED PARTIAL MASTECTOMY WITH AXILLARY SENTINEL LYMPH NODE BIOPSY Left 08/11/2016   Procedure: RADIOACTIVE SEED GUIDED LEFT BREAST LUMPECTOMY WITH AXILLARY SENTINEL LYMPH NODE BIOPSY;  Surgeon: Donnice Bury, MD;  Location: [facility];  Service: General;  Laterality: Left;   REDUCTION MAMMAPLASTY     SHOULDER ARTHROSCOPY W/ ROTATOR CUFF REPAIR Right    Social History   Socioeconomic History   Marital status: Single    Spouse name: Not on file   Number of children: 1   Years of education: Not on file   Highest education level: Not on file  Occupational History   Occupation: RN  Tobacco Use   Smoking status: Former    Current packs/day: 0.00    Average packs/day: 0.5 packs/day for 10.0 years (5.0 ttl pk-yrs)    Types: Cigarettes    Start date: 08/02/1978    Quit date: 08/02/1988    Years since quitting: 35.7   Smokeless tobacco: Never  Substance and Sexual Activity   Alcohol use: Yes    Comment: socially   Drug use:  No   Sexual activity: Not on file  Other Topics Concern   Not on file  Social History Narrative   Left handed   1 cup coffee every morning   Social Drivers of Health   Financial Resource Strain: Low Risk  (01/28/2024)   Received from [facility]   Overall Financial Resource Strain (CARDIA)    How hard is it for you to pay for the very basics like food, housing, medical care, and heating?: Not hard at all  Food Insecurity: No Food Insecurity (01/28/2024)   Received from [facility]   Hunger Vital Sign    Within the past 12 months, you worried that your food would run out before you got the money to buy more.: Never true    Within the past 12 months, the  food you bought just didn't last and you didn't have money to get more.: Never true  Transportation Needs: No Transportation Needs (01/28/2024)   Received from [facility] - Transportation    In the past 12 months, has lack of transportation kept you from medical appointments or from getting medications?: No    In the past 12 months, has lack of transportation kept you from meetings, work, or from getting things needed for daily living?: No  Physical Activity: Insufficiently Active (01/28/2024)   Received from [facility]   Exercise Vital Sign    On average, how many days per week do you engage in moderate to strenuous exercise (like a brisk walk)?: 3 days    On average, how many minutes do you engage in exercise at this level?: 30 min  Stress: No Stress Concern Present (01/28/2024)   Received from [facility] of Occupational Health - Occupational Stress Questionnaire    Do you feel stress - tense, restless, nervous, or anxious, or unable to sleep at night because your mind is troubled all the time - these days?: Not at all  Social Connections: Socially Integrated (01/28/2024)   Received from [facility]   Social Network    How would you rate your social network (family, work, friends)?: Good participation with social networks   Allergies  Allergen Reactions   Ace Inhibitors Palpitations and Shortness Of Breath    COX-2 inhibitors   Diclofenac      Other reaction(s): Hypotension (ALLERGY/intolerance)   Morphine Nausea And Vomiting   Morphine And Codeine Nausea And Vomiting   Oxycodone Other (See Comments)    hallucinations   Family History  Problem Relation Age of Onset   Colon cancer Sister 88       deceased 80   Melanoma Brother        on back; currently 33   Ovarian cancer Maternal Aunt    Colon cancer Paternal Grandmother 78       deceased   Breast cancer Cousin 70       mat female cousin; daughter of unaffected mat aunt; deceased 47    Prostate cancer Father        deceased 26     Current Outpatient Medications (Cardiovascular):    atorvastatin (LIPITOR) 20 MG tablet, Take 20 mg by mouth daily at 6 PM.  Current Outpatient Medications (Respiratory):    cetirizine (ZYRTEC) 10 MG tablet, Take by mouth.   triamcinolone  (NASACORT  ALLERGY 24HR) 55 MCG/ACT AERO nasal inhaler, Place 2 sprays into the nose daily.  Current Outpatient Medications (Analgesics):    meloxicam  (MOBIC ) 15 MG tablet, TAKE 1 TABLET (15  MG TOTAL) BY MOUTH AS NEEDED.   Current Outpatient Medications (Other):    cholecalciferol (VITAMIN D3) 25 MCG (1000 UNIT) tablet, Take by mouth.   Multiple Vitamin (MULTIVITAMIN) capsule, Take by mouth.   venlafaxine  XR (EFFEXOR -XR) 75 MG 24 hr capsule, TAKE 1 CAPSULE BY MOUTH DAILY WITH BREAKFAST. (Patient taking differently: Take 37.5 mg by mouth daily with breakfast. 37.5 mg)   Vitamin D, Ergocalciferol, (DRISDOL) 1.25 MG (50000 UNIT) CAPS capsule, TAKE 1 CAPSULE BY MOUTH WEEKLY FOR 48 doses   zolpidem (AMBIEN) 10 MG tablet, Take 10 mg by mouth at bedtime as needed for sleep.   Reviewed prior external information including notes and imaging from  primary care provider As well as notes that were available from care everywhere and other healthcare systems.  Past medical history, social, surgical and family history all reviewed in electronic medical record.  No pertanent information unless stated regarding to the chief complaint.   Review of Systems:  No headache, visual changes, nausea, vomiting, diarrhea, constipation, dizziness, abdominal pain, skin rash, fevers, chills, night sweats, weight loss, swollen lymph nodes, body aches, joint swelling, chest pain, shortness of breath, mood changes. POSITIVE muscle aches  Objective  Blood pressure 120/60, pulse 95, height 5' 1 (1.549 m), SpO2 95%.   General: No apparent distress alert and oriented x3 mood and affect normal, dressed appropriately.  HEENT: Pupils  equal, extraocular movements intact  Respiratory: Patient's speak in full sentences and does not appear short of breath  Cardiovascular: No lower extremity edema, non tender, no erythema  Thumb exam shows positive grind test noted.  Tender to palpation mostly in the thumb itself.  Mild swelling noted.   Procedure: Real-time Ultrasound Guided Injection of left CMC joint Device: GE Logiq Q7 Ultrasound guided injection is preferred based studies that show increased duration, increased effect, greater accuracy, decreased procedural pain, increased response rate, and decreased cost with ultrasound guided versus blind injection.  Verbal informed consent obtained.  Time-out conducted.  Noted no overlying erythema, induration, or other signs of local infection.  Skin prepped in a sterile fashion.  Local anesthesia: Topical Ethyl chloride.  With sterile technique and under real time ultrasound guidance: With a 25-gauge half inch needle injected with 0.5 cc of 0.5% Marcaine  and 0.5 cc of Kenalog  40 mg/mL Completed without difficulty  Pain immediately resolved suggesting accurate placement of the medication.  Advised to call if fevers/chills, erythema, induration, drainage, or persistent bleeding.  Images saved Impression: Technically successful ultrasound guided injection.    Impression and Recommendations:    Arthritis of carpometacarpal [facility]) joint of left thumb Repeat injection given today, tolerated the procedure well, hopeful that this will make significant improvement.  Patient still wants to avoid any surgical intervention even though patient does have significant arthritic changes.  Discussed icing regimen, home exercises, follow-up again in 10 to 12 weeks discussed potential custom bracing but patient declined at today  The above documentation has been reviewed and is accurate and complete Jaelee Laughter M Anjeanette Petzold, DO

## 2024-04-15 ENCOUNTER — Other Ambulatory Visit: Payer: Self-pay

## 2024-04-15 ENCOUNTER — Ambulatory Visit: Admitting: Family Medicine

## 2024-04-15 VITALS — BP 120/60 | HR 95 | Ht 61.0 in

## 2024-04-15 DIAGNOSIS — M1812 Unilateral primary osteoarthritis of first carpometacarpal joint, left hand: Secondary | ICD-10-CM

## 2024-04-15 DIAGNOSIS — M79645 Pain in left finger(s): Secondary | ICD-10-CM

## 2024-04-15 NOTE — Patient Instructions (Addendum)
 Injected thumb today See me again in 3 months

## 2024-04-15 NOTE — Assessment & Plan Note (Signed)
 Repeat injection given today, tolerated the procedure well, hopeful that this will make significant improvement.  Patient still wants to avoid any surgical intervention even though patient does have significant arthritic changes.  Discussed icing regimen, home exercises, follow-up again in 10 to 12 weeks discussed potential custom bracing but patient declined at today

## 2024-04-16 ENCOUNTER — Encounter: Payer: Self-pay | Admitting: Family Medicine

## 2024-07-02 NOTE — Progress Notes (Unsigned)
 " [facility] [address] [place] [address] Phone: [phone] Subjective:   Felicia Hampton, am serving as a scribe for Dr. Arthea Claudene.  I'm seeing this patient by the request  of:  Pura Lenis, MD  CC: Left thumb pain  [name]  04/15/2024 Repeat injection given today, tolerated the procedure well, hopeful that this will make significant improvement.  Patient still wants to avoid any surgical intervention even though patient does have significant arthritic changes.  Discussed icing regimen, home exercises, follow-up again in 10 to 12 weeks discussed potential custom bracing but patient declined at today     Updated 07/08/2024 Felicia Hampton is a 68 y.o. female coming in with complaint of L thumb pain. Patient states that her thumb is bothering her more than last visit. Injection did not last as long.   Woke up Christmas eve morning with a sore neck on R side. Taking meloxicam  but muscle stays sore. Unable to rotate neck to the right side.       Past Medical History:  Diagnosis Date   Adult ADHD    Breast cancer (HCC) 2018   Left Breast Cancer   Cancer (HCC) 2018   left breast   Family history of breast cancer    Family history of colon cancer    Family history of ovarian cancer    Genetic testing 08/02/2016   Negative; No pathogenic mutations detected. STAT Breast panel with reflex to Common Cancers panel  Genes tested were the 43 genes on [facility]'s Common Cancers panel (APC, ATM, AXIN2, BARD1, BMPR1A, BRCA1, BRCA2, BRIP1, CDH1, CDKN2A, CHEK2, DICER1, EPCAM, GREM1, HOXB13, KIT, MEN1, MLH1, MSH2, MSH6, MUTYH, NBN, NF1, PALB2, PDGFRA, PMS2, POLD1, POLE, PTEN, RAD50, RAD51C, RAD51D, SDHA, SDHB, SDHC, SDHD   History of radiation therapy 10/10/16-11/24/16   left breast 50.4 Gy in 28 fractions, left breast boost 12 Gy in 6 fractions   Hypercholesteremia    Melanoma (HCC)    left arm   Personal history of radiation therapy 2018   Left  Breast Cancer   PONV (postoperative nausea and vomiting)    Past Surgical History:  Procedure Laterality Date   ABDOMINAL HYSTERECTOMY     BREAST LUMPECTOMY Left 08/11/2016   BREAST REDUCTION SURGERY Bilateral 08/23/2016   Procedure: BILATERAL ONCOPLASTIC BREAST REDUCTION;  Surgeon: Earlis Ranks, MD;  Location: [facility];  Service: Plastics;  Laterality: Bilateral;   KNEE ARTHROSCOPY     x3   RADIOACTIVE SEED GUIDED PARTIAL MASTECTOMY WITH AXILLARY SENTINEL LYMPH NODE BIOPSY Left 08/11/2016   Procedure: RADIOACTIVE SEED GUIDED LEFT BREAST LUMPECTOMY WITH AXILLARY SENTINEL LYMPH NODE BIOPSY;  Surgeon: Donnice Bury, MD;  Location:  SURGERY CENTER;  Service: General;  Laterality: Left;   REDUCTION MAMMAPLASTY     SHOULDER ARTHROSCOPY W/ ROTATOR CUFF REPAIR Right    Social History   Socioeconomic History   Marital status: Single    Spouse name: Not on file   Number of children: 1   Years of education: Not on file   Highest education level: Not on file  Occupational History   Occupation: RN  Tobacco Use   Smoking status: Former    Current packs/day: 0.00    Average packs/day: 0.5 packs/day for 10.0 years (5.0 ttl pk-yrs)    Types: Cigarettes    Start date: 08/02/1978    Quit date: 08/02/1988    Years since quitting: 35.9   Smokeless tobacco: Never  Substance and Sexual Activity  Alcohol use: Yes    Comment: socially   Drug use: No   Sexual activity: Not on file  Other Topics Concern   Not on file  Social History Narrative   Left handed   1 cup coffee every morning   Social Drivers of Health   Tobacco Use: Medium Risk (06/24/2024)   Received from [facility]   Patient History    Smoking Tobacco Use: Former    Smokeless Tobacco Use: Never    Passive Exposure: Past  Physicist, Medical Strain: Low Risk (01/28/2024)   Received from [facility]   Overall Financial Resource Strain (CARDIA)    How hard is it for you to pay for the very  basics like food, housing, medical care, and heating?: Not hard at all  Food Insecurity: No Food Insecurity (01/28/2024)   [facility]   Epic    Within the past 12 months, you worried that your food would run out before you got the money to buy more.: Never true    Within the past 12 months, the food you bought just didn't last and you didn't have money to get more.: Never true  Transportation Needs: No Transportation Needs (01/28/2024)   Received from [facility]    In the past 12 months, has lack of transportation kept you from medical appointments or from getting medications?: No    In the past 12 months, has lack of transportation kept you from meetings, work, or from getting things needed for daily living?: No  Physical Activity: Insufficiently Active (01/28/2024)   Received from [facility]   Exercise Vital Sign    On average, how many days per week do you engage in moderate to strenuous exercise (like a brisk walk)?: 3 days    On average, how many minutes do you engage in exercise at this level?: 30 min  Stress: No Stress Concern Present (01/28/2024)   Received from [facility] - Occupational Stress Questionnaire    Do you feel stress - tense, restless, nervous, or anxious, or unable to sleep at night because your mind is troubled all the time - these days?: Not at all  Social Connections: Socially Integrated (01/28/2024)   Received from [facility]   Social Network    How would you rate your social network (family, work, friends)?: Good participation with social networks  Depression (PHQ2-9): Not on file  Alcohol Screen: Not on file  Housing: Low Risk (01/28/2024)   Received from [facility]    In the last 12 months, was there a time when you were not able to pay the mortgage or rent on time?: No    In the past 12 months, how many times have you moved where you were living?: 0    At any time in the  past 12 months, were you homeless or living in a shelter (including now)?: No  Utilities: Not At Risk (01/28/2024)   Received from [facility]    In the past 12 months has the electric, gas, oil, or water company threatened to shut off services in your home?: No  Health Literacy: Not on file   Allergies[1] Family History  Problem Relation Age of Onset   Colon cancer Sister 39       deceased 75   Melanoma Brother        on back; currently 43   Ovarian cancer Maternal  Aunt    Colon cancer Paternal Grandmother 52       deceased   Breast cancer Cousin 73       mat female cousin; daughter of unaffected mat aunt; deceased 36   Prostate cancer Father        deceased 71    Current Outpatient Medications (Cardiovascular):    atorvastatin (LIPITOR) 20 MG tablet, Take 20 mg by mouth daily at 6 PM.  Current Outpatient Medications (Respiratory):    cetirizine (ZYRTEC) 10 MG tablet, Take by mouth.   triamcinolone  (NASACORT  ALLERGY 24HR) 55 MCG/ACT AERO nasal inhaler, Place 2 sprays into the nose daily.  Current Outpatient Medications (Analgesics):    meloxicam  (MOBIC ) 15 MG tablet, TAKE 1 TABLET (15 MG TOTAL) BY MOUTH AS NEEDED.  Current Outpatient Medications (Other):    cholecalciferol (VITAMIN D3) 25 MCG (1000 UNIT) tablet, Take by mouth.   Multiple Vitamin (MULTIVITAMIN) capsule, Take by mouth.   tiZANidine  (ZANAFLEX ) 2 MG tablet, Take 1 tablet (2 mg total) by mouth at bedtime.   venlafaxine  XR (EFFEXOR -XR) 75 MG 24 hr capsule, TAKE 1 CAPSULE BY MOUTH DAILY WITH BREAKFAST. (Patient taking differently: Take 37.5 mg by mouth daily with breakfast. 37.5 mg)   Vitamin D, Ergocalciferol, (DRISDOL) 1.25 MG (50000 UNIT) CAPS capsule, TAKE 1 CAPSULE BY MOUTH WEEKLY FOR 48 doses   zolpidem (AMBIEN) 10 MG tablet, Take 10 mg by mouth at bedtime as needed for sleep.   Reviewed prior external information including notes and imaging from  primary care provider As well as notes that were  available from care everywhere and other healthcare systems.  Past medical history, social, surgical and family history all reviewed in electronic medical record.  No pertanent information unless stated regarding to the chief complaint.   Review of Systems:  No headache, visual changes, nausea, vomiting, diarrhea, constipation, dizziness, abdominal pain, skin rash, fevers, chills, night sweats, weight loss, swollen lymph nodes, body aches, joint swelling, chest pain, shortness of breath, mood changes. POSITIVE muscle aches  Objective  Blood pressure 106/68, pulse 75, height 5' 1 (1.549 m), weight 174 lb (78.9 kg), SpO2 98%.   General: No apparent distress alert and oriented x3 mood and affect normal, dressed appropriately.  HEENT: Pupils equal, extraocular movements intact  Respiratory: Patient's speak in full sentences and does not appear short of breath  Cardiovascular: No lower extremity edema, non tender, no erythema  Left thumb arthritis noted positive grind test  no thenar eminence wasting  Right shoulder blade multiple trigger pints in the trapezius, rhomboid and latissimus dorsi muscle   After verbal consent patient was prepped with alcohol swab and with a 25-gauge half inch needle injected in 4 distinct trigger points in the right shoulder region with a total of 3 cc of 0.5% Marcaine  and 1 cc of Kenalog  40 mg/mL.  This was in the trapezius, rhomboid, and latissimus dorsi no blood loss.  Band-Aid placed.  Postinjection instructions given.  Procedure: ultrasound guided left CMC joint  Ultrasound guided injection is preferred based studies that show increased duration, increased effect, greater accuracy, decreased procedural pain, increased response rate, and decreased cost with ultrasound guided versus blind injection.  Verbal informed consent obtained.  Time-out conducted.  Noted no overlying erythema, induration, or other signs of local infection.  Skin prepped in a sterile fashion.   Local anesthesia: Topical Ethyl chloride.  With sterile technique and under real time ultrasound guidance: needle advanced into subacromial bursa, bursa seen distending under real time ultrasound  guidance. 1 cc Kenalog -40, 2 cc marcaine  injected into left cmc joint . Completed without difficulty  Pain immediately resolved suggesting accurate placement of the medication.  Advised to call if fevers/chills, erythema, induration, drainage, or persistent bleeding.  Images saved.      Impression and Recommendations:     The above documentation has been reviewed and is accurate and complete Arthea CHRISTELLA Sharps, DO       [1]  Allergies Allergen Reactions   Ace Inhibitors Palpitations and Shortness Of Breath    COX-2 inhibitors   Diclofenac      Other reaction(s): Hypotension (ALLERGY/intolerance)   Morphine Nausea And Vomiting   Morphine And Codeine Nausea And Vomiting   Oxycodone Other (See Comments)    hallucinations   "

## 2024-07-08 ENCOUNTER — Ambulatory Visit (INDEPENDENT_AMBULATORY_CARE_PROVIDER_SITE_OTHER): Admitting: Family Medicine

## 2024-07-08 ENCOUNTER — Other Ambulatory Visit: Payer: Self-pay

## 2024-07-08 VITALS — BP 106/68 | HR 75 | Ht 61.0 in | Wt 174.0 lb

## 2024-07-08 DIAGNOSIS — M25511 Pain in right shoulder: Secondary | ICD-10-CM | POA: Diagnosis not present

## 2024-07-08 DIAGNOSIS — M1812 Unilateral primary osteoarthritis of first carpometacarpal joint, left hand: Secondary | ICD-10-CM

## 2024-07-08 DIAGNOSIS — G8929 Other chronic pain: Secondary | ICD-10-CM | POA: Diagnosis not present

## 2024-07-08 DIAGNOSIS — M79645 Pain in left finger(s): Secondary | ICD-10-CM | POA: Diagnosis not present

## 2024-07-08 MED ORDER — TIZANIDINE HCL 2 MG PO TABS: 2.0000 mg | ORAL_TABLET | Freq: Every day | ORAL | 0 refills | Status: DC

## 2024-07-08 NOTE — Patient Instructions (Addendum)
 L thumb injection Trigger point injections today See me again in 10-12 weeks Zanaflex  2mg  at night for next 3 nights then as needed

## 2024-07-08 NOTE — Assessment & Plan Note (Signed)
 Chronic problem, has been dealing with it for now over 4 years.  Has been doing relatively well with intermittent injections.  Still holding off on any type of surgical intervention but will consider it.  Discussed icing regimen and home exercises, discussed which activities to do and which ones to avoid.  Increase activity slowly.  Follow-up again in 6 to 8 weeks otherwise.

## 2024-07-08 NOTE — Assessment & Plan Note (Signed)
 Patient given injection and tolerated the procedure well, discussed icing regimen and home exercises, discussed which activities to do and which ones to avoid.  Increase activity slowly.  Follow-up again in 6 to 12 weeks.

## 2024-07-09 ENCOUNTER — Encounter: Payer: Self-pay | Admitting: Family Medicine

## 2024-07-30 ENCOUNTER — Other Ambulatory Visit: Payer: Self-pay | Admitting: Family Medicine

## 2024-09-16 ENCOUNTER — Ambulatory Visit: Admitting: Family Medicine
